# Patient Record
Sex: Female | Born: 1970
Health system: Southern US, Community
[De-identification: ages and names within clinical notes are randomized; demographics above are authoritative.]

## PROBLEM LIST (undated history)

## (undated) ENCOUNTER — Inpatient Hospital Stay (HOSPITAL_COMMUNITY): Payer: Self-pay

## (undated) DIAGNOSIS — G4730 Sleep apnea, unspecified: Secondary | ICD-10-CM

## (undated) DIAGNOSIS — K219 Gastro-esophageal reflux disease without esophagitis: Secondary | ICD-10-CM

## (undated) DIAGNOSIS — T7840XA Allergy, unspecified, initial encounter: Secondary | ICD-10-CM

## (undated) DIAGNOSIS — Z789 Other specified health status: Secondary | ICD-10-CM

## (undated) DIAGNOSIS — Z8632 Personal history of gestational diabetes: Secondary | ICD-10-CM

## (undated) DIAGNOSIS — Z862 Personal history of diseases of the blood and blood-forming organs and certain disorders involving the immune mechanism: Secondary | ICD-10-CM

## (undated) DIAGNOSIS — G4733 Obstructive sleep apnea (adult) (pediatric): Secondary | ICD-10-CM

## (undated) DIAGNOSIS — J302 Other seasonal allergic rhinitis: Secondary | ICD-10-CM

## (undated) DIAGNOSIS — Z9989 Dependence on other enabling machines and devices: Secondary | ICD-10-CM

## (undated) HISTORY — DX: Obstructive sleep apnea (adult) (pediatric): G47.33

## (undated) HISTORY — DX: Other seasonal allergic rhinitis: J30.2

## (undated) HISTORY — DX: Personal history of diseases of the blood and blood-forming organs and certain disorders involving the immune mechanism: Z86.2

## (undated) HISTORY — DX: Gastro-esophageal reflux disease without esophagitis: K21.9

## (undated) HISTORY — PX: WISDOM TOOTH EXTRACTION: SHX21

## (undated) HISTORY — PX: BREAST SURGERY: SHX581

## (undated) HISTORY — DX: Personal history of gestational diabetes: Z86.32

## (undated) HISTORY — DX: Sleep apnea, unspecified: G47.30

## (undated) HISTORY — DX: Allergy, unspecified, initial encounter: T78.40XA

## (undated) HISTORY — PX: BREAST REDUCTION SURGERY: SHX8

---
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
## 2006-06-14 ENCOUNTER — Emergency Department (HOSPITAL_COMMUNITY): Admission: EM | Admit: 2006-06-14 | Discharge: 2006-06-14 | Payer: Self-pay | Admitting: Emergency Medicine

## 2007-11-01 ENCOUNTER — Inpatient Hospital Stay (HOSPITAL_COMMUNITY): Admission: AD | Admit: 2007-11-01 | Discharge: 2007-11-05 | Payer: Self-pay | Admitting: *Deleted

## 2007-11-03 ENCOUNTER — Encounter (INDEPENDENT_AMBULATORY_CARE_PROVIDER_SITE_OTHER): Payer: Self-pay | Admitting: Obstetrics and Gynecology

## 2010-01-25 ENCOUNTER — Inpatient Hospital Stay (HOSPITAL_COMMUNITY): Admission: AD | Admit: 2010-01-25 | Discharge: 2010-01-25 | Payer: Self-pay | Admitting: Obstetrics and Gynecology

## 2010-04-23 ENCOUNTER — Inpatient Hospital Stay (HOSPITAL_COMMUNITY): Admission: AD | Admit: 2010-04-23 | Discharge: 2010-04-25 | Payer: Self-pay | Admitting: Obstetrics and Gynecology

## 2010-11-19 LAB — CBC
HCT: 45 % (ref 36.0–46.0)
MCHC: 34.7 g/dL (ref 30.0–36.0)
MCV: 98.7 fL (ref 78.0–100.0)
Platelets: 178 10*3/uL (ref 150–400)
Platelets: 221 10*3/uL (ref 150–400)
RDW: 14 % (ref 11.5–15.5)
RDW: 14 % (ref 11.5–15.5)
WBC: 11.8 10*3/uL — ABNORMAL HIGH (ref 4.0–10.5)
WBC: 12.5 10*3/uL — ABNORMAL HIGH (ref 4.0–10.5)

## 2010-11-19 LAB — RH IMMUNE GLOB WKUP(>/=20WKS)(NOT WOMEN'S HOSP)

## 2010-11-19 LAB — RPR: RPR Ser Ql: NONREACTIVE

## 2010-11-22 LAB — RH IMMUNE GLOBULIN WORKUP (NOT WOMEN'S HOSP)
ABO/RH(D): O NEG
Antibody Screen: NEGATIVE

## 2011-01-18 NOTE — Op Note (Signed)
Carol Parks, Carol Parks                 ACCOUNT NO.:  [id number]   MEDICAL RECORD NO.:  [id number]          PATIENT TYPE:  INP   LOCATION:  9164                          FACILITY:  [facility]   PHYSICIAN:  Lenoard Aden, M.D.DATE OF BIRTH:  [birth year]   DATE OF PROCEDURE:  DATE OF DISCHARGE:                               OPERATIVE REPORT   INDICATION FOR OPERATIVE DELIVERY:  Prolonged second stage maternal  exhaustion.   POSTOPERATIVE DIAGNOSIS:  Prolonged second stage maternal exhaustion.   PROCEDURE:  Outlet vacuum assisted vaginal delivery with Kiwi cup x1  pull, second degree midline laceration with bilateral sulcus tears with  repair.   SURGEON:  Lenoard Aden, M.D.   ANESTHESIA:  Epidural and local.   ESTIMATED BLOOD LOSS:  Six hundred mL.   COMPLICATIONS:  None.   DRAINS:  None.   COUNTS:  Correct.   CONDITION:  The patient to recovery in good condition.   BRIEF OPERATIVE NOTE:  After being apprised of the risks of vacuum  assistance including small incidence of cephalohematoma, scalp  laceration, intracranial hemorrhage, Kiwi cup was placed OA, appropriate  position, +3/+4 station for 1 pull over midline laceration as noted.  Full term living female, nuchal cord x1 reduced, Apgars 8 and 9.  Cord  blood collected.  Placenta was delivered manually intact, three vessel  cord noted.  Cervix inspected without lacerations.  Repair of bilateral  sulcus tears and midline laceration with 3-0 Vicryl and 4-0 Vicryl.  No  complications noted.  The patient tolerated the procedure well and went  to recovery in good condition.      Lenoard Aden, M.D.  Electronically Signed     RJT/MEDQ  D:  11/03/2007  T:  11/04/2007  Job:  [id number]

## 2011-01-21 NOTE — Discharge Summary (Signed)
Carol Parks, Carol Parks                 ACCOUNT NO.:  [id number]   MEDICAL RECORD NO.:  [id number]          PATIENT TYPE:  INP   LOCATION:  9374                          FACILITY:  [facility]   PHYSICIAN:  Lenoard Aden, M.D.DATE OF BIRTH:  04-23-1971   DATE OF ADMISSION:  11/01/2007  DATE OF DISCHARGE:  11/05/2007                               DISCHARGE SUMMARY   ADMISSION DIAGNOSIS:  Induction for mild preeclampsia.   DISCHARGE DIAGNOSIS:  Induction for mild preeclampsia.   HOSPITAL COURSE:  The patient underwent uncomplicated vacuum assistant  vaginal delivery with second degree laceration and repair on November 03, 2007.  Postoperative course was uncomplicated.  Blood pressure was  normalized at the 130s/70s.  On postpartum day #2, she continued to  improve.  Tolerating regular diet well.  Diuresed without difficulty.  She was discharged to home on November 05, 2007.  To follow up in the office  for 1-week blood pressure check.  Prenatal vitamins and iron, given.  Nonsteroidal inflammatory was recommended for pain relief.  Preeclamptic  precautions given.  Discharge teaching done.      Lenoard Aden, M.D.  Electronically Signed     RJT/MEDQ  D:  01/11/2008  T:  01/12/2008  Job:  [id number]

## 2011-05-27 LAB — COMPREHENSIVE METABOLIC PANEL
ALT: 13
AST: 20
Albumin: 2.3 — ABNORMAL LOW
Alkaline Phosphatase: 144 — ABNORMAL HIGH
BUN: 3 — ABNORMAL LOW
Calcium: 8.7
Chloride: 106
GFR calc Af Amer: >60
Glucose, Bld: 111 — ABNORMAL HIGH
Potassium: 3.5
Sodium: 134 — ABNORMAL LOW
Total Bilirubin: 0.8
Total Protein: 5.7 — ABNORMAL LOW

## 2011-05-27 LAB — CBC
HCT: 41.2
Hemoglobin: 14.7
MCHC: 34.7
RBC: 4.29
RBC: 4.33
RDW: 13.7
WBC: 16.5 — ABNORMAL HIGH

## 2011-05-27 LAB — SAMPLE TO BLOOD BANK

## 2011-05-27 LAB — DIC (DISSEMINATED INTRAVASCULAR COAGULATION)PANEL
Smear Review: NONE SEEN
aPTT: 28

## 2011-05-27 LAB — URIC ACID: Uric Acid, Serum: 3.9

## 2011-05-27 LAB — LACTATE DEHYDROGENASE: LDH: 151

## 2011-05-30 LAB — URIC ACID: Uric Acid, Serum: 3.9

## 2011-05-30 LAB — CBC
HCT: 31 — ABNORMAL LOW
HCT: 31.1 — ABNORMAL LOW
MCHC: 34.5
MCV: 96.1
Platelets: 196
Platelets: 216
RDW: 14
WBC: 18.8 — ABNORMAL HIGH

## 2011-05-30 LAB — RH IMMUNE GLOB WKUP(>/=20WKS)(NOT WOMEN'S HOSP): Fetal Screen: NEGATIVE

## 2011-05-30 LAB — COMPREHENSIVE METABOLIC PANEL
Albumin: 1.9 — ABNORMAL LOW
BUN: 4 — ABNORMAL LOW
Calcium: 6 — CL
Creatinine, Ser: 0.65
Total Protein: 4.9 — ABNORMAL LOW

## 2011-05-30 LAB — LACTATE DEHYDROGENASE: LDH: 235

## 2012-05-24 ENCOUNTER — Ambulatory Visit (INDEPENDENT_AMBULATORY_CARE_PROVIDER_SITE_OTHER): Payer: 59 | Admitting: Family Medicine

## 2012-05-24 VITALS — BP 138/90 | HR 84 | Temp 97.9°F | Resp 18 | Ht 64.0 in | Wt 246.8 lb

## 2012-05-24 DIAGNOSIS — Z30432 Encounter for removal of intrauterine contraceptive device: Secondary | ICD-10-CM

## 2012-05-24 DIAGNOSIS — M545 Low back pain, unspecified: Secondary | ICD-10-CM

## 2012-05-24 DIAGNOSIS — Z719 Counseling, unspecified: Secondary | ICD-10-CM

## 2012-05-24 DIAGNOSIS — Z0000 Encounter for general adult medical examination without abnormal findings: Secondary | ICD-10-CM

## 2012-05-24 LAB — POCT CBC
HCT, POC: 48.9 % — AB (ref 37.7–47.9)
MCH, POC: 31.5 pg — AB (ref 27–31.2)
MCV: 98.6 fL — AB (ref 80–97)
MID (cbc): 0.7 (ref 0–0.9)
POC LYMPH PERCENT: 23.9 %L (ref 10–50)
Platelet Count, POC: 354 10*3/uL (ref 142–424)
RDW, POC: 13.4 %
WBC: 10 10*3/uL (ref 4.6–10.2)

## 2012-05-24 NOTE — Patient Instructions (Signed)
Your should receive a call or letter about your lab results within the next week to 10 days.  Keeping You Healthy  Get These Tests 1. Blood Pressure- Have your blood pressure checked once a year by your health care provider.  Normal blood pressure is 120/80. 2. Weight- Have your body mass index (BMI) calculated to screen for obesity.  BMI is measure of body fat based on height and weight.  You can also calculate your own BMI at https://www.west-esparza.com/. 3. Cholesterol- Have your cholesterol checked every 5 years starting at age 1 then yearly starting at age 21. 4. Chlamydia, HIV, and other sexually transmitted diseases- Get screened every year until age 20, then within three months of each new sexual provider. 5. Pap Smear- Every 1-3 years; discuss with your health care provider. 6. Mammogram- Every year starting at age 42  Take these medicines  Calcium with Vitamin D-Your body needs 1200 mg of Calcium each day and [phone] IU of Vitamin D daily.  Your body can only absorb 500 mg of Calcium at a time so Calcium must be taken in 2 or 3 divided doses throughout the day.  Multivitamin with folic acid- Once daily if it is possible for you to become pregnant.  Get these Immunizations  Gardasil-Series of three doses; prevents HPV related illness such as genital warts and cervical cancer.  Menactra-Single dose; prevents meningitis.  Tetanus shot- Every 10 years.  Flu shot-Every year.  Take these steps 1. Do not smoke-Your healthcare provider can help you quit.  For tips on how to quit go to www.smokefree.gov or call 1-800 QUITNOW. 2. Be physically active- Exercise 5 days a week for at least 30 minutes.  If you are not already physically active, start slow and gradually work up to 30 minutes of moderate physical activity.  Examples of moderate activity include walking briskly, dancing, swimming, bicycling, etc. 3. Breast Cancer- A self breast exam every month is important for early detection  of breast cancer.  For more information and instruction on self breast exams, ask your healthcare provider or [email or web address]. 4. Eat a healthy diet- Eat a variety of healthy foods such as fruits, vegetables, whole grains, low fat milk, low fat cheeses, yogurt, lean meats, poultry and fish, beans, nuts, tofu, etc.  For more information go to www. Thenutritionsource.org 5. Drink alcohol in moderation- Limit alcohol intake to one drink or less per day. Never drink and drive. 6. Depression- Your emotional health is as important as your physical health.  If you're feeling down or losing interest in things you normally enjoy please talk to your healthcare provider about being screened for depression. 7. Dental visit- Brush and floss your teeth twice daily; visit your dentist twice a year. 8. Eye doctor- Get an eye exam at least every 2 years. 9. Helmet use- Always wear a helmet when riding a bicycle, motorcycle, rollerblading or skateboarding. 10. Safe sex- If you may be exposed to sexually transmitted infections, use a condom. 11. Seat belts- Seat belts can save your live; always wear one. 12. Smoke/Carbon Monoxide detectors- These detectors need to be installed on the appropriate level of your home. Replace batteries at least once a year. 13. Skin cancer- When out in the sun please cover up and use sunscreen 15 SPF or higher. 14. Violence- If anyone is threatening or hurting you, please tell your healthcare provider.

## 2012-05-24 NOTE — Progress Notes (Signed)
Subjective:    Patient ID: Carol Parks, female    DOB: Jul 06, 1971, 41 y.o.   MRN: [id number]  HPI Carol Parks is a 41 y.o. female Here for CPE/annual exam. 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  No known med problems.   Also would like removal of IUD.  Hx of Mirena - IUD removed here in 04/19/09. G2, P2002 - 4 and 39 yo girls. Current IUD in place since 2011 - Mirena.  Planning on trying to become pregnant.  Has not started PNV yet. OBGYN [facility].    Hx of low back pain - intermittent - low back only with pressure. NKI. Has massage, trying chiropracter as well.   Nki.  Sits for 12 hours. No bowel or bladder incontinence, no saddle anesthesia, no lower extremity weakness.  Exercising 2-4 times per week.    Gardasil in 2008.   Lunch 4 1/2 hours ago.  Review of Systems 13 point ros on PHS reviewed - no     Objective:   Physical Exam  Constitutional: She is oriented to person, place, and time. She appears well-developed and well-nourished.  HENT:  Head: Normocephalic and atraumatic.  Right Ear: External ear normal.  Left Ear: External ear normal.  Eyes: Conjunctivae normal and EOM are normal. Pupils are equal, round, and reactive to light.  Neck: Neck supple.  Cardiovascular: Normal rate, regular rhythm, normal heart sounds and intact distal pulses.   Pulmonary/Chest: Effort normal.  Abdominal: Soft. Bowel sounds are normal.  Genitourinary: Vagina normal and uterus normal. Pelvic exam was performed with patient supine. Cervix exhibits no motion tenderness. Right adnexum displays no tenderness. Left adnexum displays no tenderness.       See procedure below.   Musculoskeletal:       Lumbar back: She exhibits normal range of motion, no tenderness and no bony tenderness.  Neurological: She is alert and oriented to person, place, and time.  Skin: Skin is warm and dry.  Psychiatric: She has a normal mood and affect. Her behavior is normal.    VCO for IUD removal -  patient in lithotomy position, identified strings with speculum, small amt blood in vault with mucus.  Strings grasped with ring forceps and iud removed without difficulty. No complications.      Assessment & Plan:  Carol Parks is a 41 y.o. female 1. Physical exam, annual  POCT CBC, Comprehensive metabolic panel, Lipid panel, TSH  2. Low back pain    3. Counseling NOS    4. Encounter for IUD removal      Annual exam/CPE - check lipids, CMP, TSh, CBC today. Not truly fasting - so if lipids elevated - recheck fasting for 8 hours.  Low back pain -  likely mechanical/deconditioning, discussed HEP, back care manual.  Wt loss discussed and will check TSH. rtc precautions.    Counseling NOS - plans on trying to become pregnant. IUD removed as above without difficulty. Will start PNV QD - paper rx given pnv of choice qd.  Patient Instructions  Your should receive a call or letter about your lab results within the next week to 10 days.  Keeping You Healthy  Get These Tests 1. Blood Pressure- Have your blood pressure checked once a year by your health care provider.  Normal blood pressure is 120/80. 2. Weight- Have your body mass index (BMI) calculated to screen for obesity.  BMI is measure of body fat based on height and weight.  You can also calculate your  own BMI at https://www.west-esparza.com/. 3. Cholesterol- Have your cholesterol checked every 5 years starting at age 57 then yearly starting at age 65. 4. Chlamydia, HIV, and other sexually transmitted diseases- Get screened every year until age 52, then within three months of each new sexual provider. 5. Pap Smear- Every 1-3 years; discuss with your health care provider. 6. Mammogram- Every year starting at age 77  Take these medicines  Calcium with Vitamin D-Your body needs 1200 mg of Calcium each day and [phone] IU of Vitamin D daily.  Your body can only absorb 500 mg of Calcium at a time so Calcium must be taken in 2 or 3 divided doses  throughout the day.  Multivitamin with folic acid- Once daily if it is possible for you to become pregnant.  Get these Immunizations  Gardasil-Series of three doses; prevents HPV related illness such as genital warts and cervical cancer.  Menactra-Single dose; prevents meningitis.  Tetanus shot- Every 10 years.  Flu shot-Every year.  Take these steps 1. Do not smoke-Your healthcare provider can help you quit.  For tips on how to quit go to www.smokefree.gov or call 1-800 QUITNOW. 2. Be physically active- Exercise 5 days a week for at least 30 minutes.  If you are not already physically active, start slow and gradually work up to 30 minutes of moderate physical activity.  Examples of moderate activity include walking briskly, dancing, swimming, bicycling, etc. 3. Breast Cancer- A self breast exam every month is important for early detection of breast cancer.  For more information and instruction on self breast exams, ask your healthcare provider or [place]. 4. Eat a healthy diet- Eat a variety of healthy foods such as fruits, vegetables, whole grains, low fat milk, low fat cheeses, yogurt, lean meats, poultry and fish, beans, nuts, tofu, etc.  For more information go to www. Thenutritionsource.org 5. Drink alcohol in moderation- Limit alcohol intake to one drink or less per day. Never drink and drive. 6. Depression- Your emotional health is as important as your physical health.  If you're feeling down or losing interest in things you normally enjoy please talk to your healthcare provider about being screened for depression. 7. Dental visit- Brush and floss your teeth twice daily; visit your dentist twice a year. 8. Eye doctor- Get an eye exam at least every 2 years. 9. Helmet use- Always wear a helmet when riding a bicycle, motorcycle, rollerblading or skateboarding. 10. Safe sex- If you may be exposed to sexually transmitted infections, use a  condom. 11. Seat belts- Seat belts can save your live; always wear one. 12. Smoke/Carbon Monoxide detectors- These detectors need to be installed on the appropriate level of your home. Replace batteries at least once a year. 13. Skin cancer- When out in the sun please cover up and use sunscreen 15 SPF or higher. 14. Violence- If anyone is threatening or hurting you, please tell your healthcare provider.

## 2012-05-25 LAB — COMPREHENSIVE METABOLIC PANEL
AST: 15 U/L (ref 0–37)
Alkaline Phosphatase: 73 U/L (ref 39–117)
BUN: 14 mg/dL (ref 6–23)
Calcium: 8.9 mg/dL (ref 8.4–10.5)
Chloride: 104 mEq/L (ref 96–112)
Creat: 0.7 mg/dL (ref 0.50–1.10)
Glucose, Bld: 81 mg/dL (ref 70–99)

## 2012-05-25 LAB — LIPID PANEL
HDL: 48 mg/dL (ref >39)
Total CHOL/HDL Ratio: 4.5 Ratio
Triglycerides: 115 mg/dL (ref <150)

## 2012-05-25 LAB — TSH: TSH: 1.317 u[IU]/mL (ref 0.350–4.500)

## 2012-09-05 NOTE — L&D Delivery Note (Signed)
Delivery Note At 7:45 PM a viable female, "Carol Parks", was delivered via Vaginal, Spontaneous Delivery (Presentation: Left Occiput Anterior).  APGAR: 8, 9; weight .   Placenta status: Intact, Spontaneous.  Cord: 3 vessels with the following complications: None Cord pH: NA  Anesthesia: Local Epidural  Episiotomy: None Lacerations: 1st degree;Perineal Suture Repair: 3.0 monocry Est. Blood Loss (mL): 200  Mom to postpartum.  Baby to Couplet care / Skin to Skin. Placenta to path due to SGA vs IUGR. Family plans outpatient circumcision. Patient for PP BTL--consent on chart. Will maintain IV and epidural cath--plan procedure tomorrow. NPO after MN. FBS in am.  Nigel Bridgeman 09/04/2013, 8:44 PM

## 2012-10-10 ENCOUNTER — Ambulatory Visit (INDEPENDENT_AMBULATORY_CARE_PROVIDER_SITE_OTHER): Payer: 59 | Admitting: Physician Assistant

## 2012-10-10 VITALS — BP 131/92 | HR 99 | Temp 98.4°F | Resp 16 | Ht 64.0 in | Wt 243.6 lb

## 2012-10-10 DIAGNOSIS — J40 Bronchitis, not specified as acute or chronic: Secondary | ICD-10-CM

## 2012-10-10 DIAGNOSIS — J3489 Other specified disorders of nose and nasal sinuses: Secondary | ICD-10-CM

## 2012-10-10 DIAGNOSIS — R0981 Nasal congestion: Secondary | ICD-10-CM

## 2012-10-10 DIAGNOSIS — R059 Cough, unspecified: Secondary | ICD-10-CM

## 2012-10-10 DIAGNOSIS — R05 Cough: Secondary | ICD-10-CM

## 2012-10-10 MED ORDER — AZITHROMYCIN 250 MG PO TABS: ORAL_TABLET | ORAL | Status: DC

## 2012-10-10 NOTE — Progress Notes (Signed)
  Subjective:    Patient ID: Carol Parks, female    DOB: Feb 16, 1971, 42 y.o.   MRN: [id number]  HPI 42 year old female presents with 1 week history of cough.  States both of her daughters (102 and 37 years old) are currently being treated with antibiotics for similar symptoms. She has had a dry, persistent cough for the past 7 days.  Also complains of URI symptoms of nasal congestion, rhinorrhea, and some sinus pressure. Denies sinus pain, fever, chills, otalgia, sore throat, headache, neck pain, nausea, vomiting, or abdominal pain. She is otherwise doing well with no other complaints today.  She works night's as a Industrial/product designer.      Review of Systems  Constitutional: Negative for fever and chills.  HENT: Positive for congestion, rhinorrhea and postnasal drip. Negative for ear pain, sore throat, neck pain, neck stiffness and ear discharge.   Respiratory: Positive for cough. Negative for chest tightness and wheezing.   Gastrointestinal: Negative for nausea, vomiting and abdominal pain.  Neurological: Negative for dizziness, light-headedness and headaches.  All other systems reviewed and are negative.       Objective:   Physical Exam  Constitutional: She appears well-developed and well-nourished.  HENT:  Head: Normocephalic and atraumatic.  Right Ear: Hearing, tympanic membrane, external ear and ear canal normal.  Left Ear: Hearing, tympanic membrane, external ear and ear canal normal.  Mouth/Throat: Uvula is midline, oropharynx is clear and moist and mucous membranes are normal. No oropharyngeal exudate.  Eyes: Conjunctivae normal are normal.  Neck: Normal range of motion. Neck supple.  Cardiovascular: Normal rate, regular rhythm and normal heart sounds.   Pulmonary/Chest: Effort normal and breath sounds normal.  Lymphadenopathy:    She has no cervical adenopathy.  Neurological: She is alert.  Psychiatric: She has a normal mood and affect. Her behavior is normal. Judgment and thought  content normal.          Assessment & Plan:   1. Bronchitis  azithromycin (ZITHROMAX) 250 MG tablet  2. Cough    3. Nasal congestion     Zpack as directed Patient declined NS - she already has several at home she will try Also declined cough syrup - will call if she needs one.  Follow up if symptoms worsen or fail to improve.

## 2012-10-20 ENCOUNTER — Other Ambulatory Visit: Payer: Self-pay

## 2013-01-08 ENCOUNTER — Encounter: Payer: Self-pay | Admitting: Obstetrics and Gynecology

## 2013-01-08 LAB — OB RESULTS CONSOLE ANTIBODY SCREEN: Antibody Screen: NEGATIVE

## 2013-01-08 LAB — OB RESULTS CONSOLE HEPATITIS B SURFACE ANTIGEN: Hepatitis B Surface Ag: NEGATIVE

## 2013-01-08 LAB — OB RESULTS CONSOLE HIV ANTIBODY (ROUTINE TESTING): HIV: NONREACTIVE

## 2013-01-08 LAB — OB RESULTS CONSOLE ABO/RH: RH Type: POSITIVE

## 2013-01-08 LAB — OB RESULTS CONSOLE RPR: RPR: NONREACTIVE

## 2013-01-08 LAB — OB RESULTS CONSOLE GC/CHLAMYDIA: Chlamydia: NEGATIVE

## 2013-06-19 ENCOUNTER — Encounter: Payer: 59 | Attending: Obstetrics and Gynecology

## 2013-06-19 VITALS — Ht 63.5 in | Wt 250.8 lb

## 2013-06-19 DIAGNOSIS — O99810 Abnormal glucose complicating pregnancy: Secondary | ICD-10-CM

## 2013-06-19 DIAGNOSIS — Z713 Dietary counseling and surveillance: Secondary | ICD-10-CM | POA: Diagnosis not present

## 2013-06-20 NOTE — Progress Notes (Signed)
  Patient was seen on 06/19/13 for Gestational Diabetes self-management class at the [facility]. The following learning objectives were met by the patient during this course:   States the definition of Gestational Diabetes  States why dietary management is important in controlling blood glucose  Describes the effects each nutrient has on blood glucose levels  Demonstrates ability to create a balanced meal plan  Demonstrates carbohydrate counting   States when to check blood glucose levels  Demonstrates proper blood glucose monitoring techniques  States the effect of stress and exercise on blood glucose levels  States the importance of limiting caffeine and abstaining from alcohol and smoking  Blood glucose monitor given: Accu Chek Nano BG Monitoring Kit Lot # [id number] Exp: 08/04/14  Patient instructed to monitor glucose levels: FBS: 60 - <90 1 hour: <140 2 hour: <120  *Patient received handouts:  Nutrition Diabetes and Pregnancy  Carbohydrate Counting List  Patient will be seen for follow-up as needed.

## 2013-07-11 ENCOUNTER — Other Ambulatory Visit: Payer: Self-pay

## 2013-08-13 ENCOUNTER — Encounter (HOSPITAL_COMMUNITY): Payer: Self-pay | Admitting: *Deleted

## 2013-08-13 ENCOUNTER — Inpatient Hospital Stay (HOSPITAL_COMMUNITY)
Admission: AD | Admit: 2013-08-13 | Discharge: 2013-08-13 | Disposition: A | Discharge status: Home / Self Care | Payer: 59 | Source: Ambulatory Visit | Attending: Obstetrics and Gynecology | Admitting: Obstetrics and Gynecology

## 2013-08-13 DIAGNOSIS — O479 False labor, unspecified: Secondary | ICD-10-CM | POA: Diagnosis present

## 2013-08-13 HISTORY — DX: Other specified health status: Z78.9

## 2013-08-13 NOTE — MAU Note (Signed)
Pt presents with complaint of contractions. Denies bleeding or ROM 

## 2013-08-13 NOTE — Discharge Instructions (Signed)
Br

## 2013-08-13 NOTE — MAU Note (Signed)
Started having consistent uc's 1700.l Denies LOF, discharge, or bleeding.

## 2013-09-03 ENCOUNTER — Inpatient Hospital Stay (HOSPITAL_COMMUNITY)
Admission: AD | Admit: 2013-09-03 | Discharge: 2013-09-06 | DRG: 767 | Disposition: A | Discharge status: Home / Self Care | Payer: 59 | Source: Intra-hospital | Attending: Obstetrics and Gynecology | Admitting: Obstetrics and Gynecology

## 2013-09-03 ENCOUNTER — Encounter (HOSPITAL_COMMUNITY): Payer: Self-pay | Admitting: *Deleted

## 2013-09-03 DIAGNOSIS — O360990 Maternal care for other rhesus isoimmunization, unspecified trimester, not applicable or unspecified: Secondary | ICD-10-CM | POA: Diagnosis present

## 2013-09-03 DIAGNOSIS — Z91040 Latex allergy status: Secondary | ICD-10-CM | POA: Diagnosis present

## 2013-09-03 DIAGNOSIS — Z302 Encounter for sterilization: Secondary | ICD-10-CM | POA: Diagnosis not present

## 2013-09-03 DIAGNOSIS — O09299 Supervision of pregnancy with other poor reproductive or obstetric history, unspecified trimester: Secondary | ICD-10-CM

## 2013-09-03 DIAGNOSIS — O09529 Supervision of elderly multigravida, unspecified trimester: Secondary | ICD-10-CM

## 2013-09-03 DIAGNOSIS — Z6791 Unspecified blood type, Rh negative: Secondary | ICD-10-CM | POA: Diagnosis present

## 2013-09-03 DIAGNOSIS — O365990 Maternal care for other known or suspected poor fetal growth, unspecified trimester, not applicable or unspecified: Principal | ICD-10-CM | POA: Diagnosis present

## 2013-09-03 DIAGNOSIS — O24419 Gestational diabetes mellitus in pregnancy, unspecified control: Secondary | ICD-10-CM | POA: Diagnosis present

## 2013-09-03 DIAGNOSIS — O480 Post-term pregnancy: Secondary | ICD-10-CM | POA: Diagnosis present

## 2013-09-03 DIAGNOSIS — Z9889 Other specified postprocedural states: Secondary | ICD-10-CM

## 2013-09-03 DIAGNOSIS — O99814 Abnormal glucose complicating childbirth: Secondary | ICD-10-CM | POA: Diagnosis present

## 2013-09-03 MED ORDER — OXYTOCIN 40 UNITS IN LACTATED RINGERS INFUSION - SIMPLE MED
62.5000 mL/h | INTRAVENOUS | Status: DC
  Administered 2013-09-04: 500 mL/h via INTRAVENOUS

## 2013-09-03 MED ORDER — LACTATED RINGERS IV SOLN
500.0000 mL | INTRAVENOUS | Status: DC | PRN
  Administered 2013-09-04 (×2): 500 mL via INTRAVENOUS

## 2013-09-03 MED ORDER — ZOLPIDEM TARTRATE 5 MG PO TABS: 5.0000 mg | ORAL_TABLET | Freq: Every evening | ORAL | Status: DC | PRN

## 2013-09-03 MED ORDER — IBUPROFEN 600 MG PO TABS
600.0000 mg | ORAL_TABLET | Freq: Four times a day (QID) | ORAL | Status: DC | PRN
  Administered 2013-09-04: 600 mg via ORAL
  Filled 2013-09-03: qty 1

## 2013-09-03 MED ORDER — OXYTOCIN BOLUS FROM INFUSION: 500.0000 mL | INTRAVENOUS | Status: DC

## 2013-09-03 MED ORDER — ONDANSETRON HCL 4 MG/2ML IJ SOLN: 4.0000 mg | Freq: Four times a day (QID) | INTRAMUSCULAR | Status: DC | PRN

## 2013-09-03 MED ORDER — CITRIC ACID-SODIUM CITRATE 334-500 MG/5ML PO SOLN: 30.0000 mL | ORAL | Status: DC | PRN

## 2013-09-03 MED ORDER — ACETAMINOPHEN 325 MG PO TABS: 650.0000 mg | ORAL_TABLET | ORAL | Status: DC | PRN

## 2013-09-03 MED ORDER — LIDOCAINE HCL (PF) 1 % IJ SOLN
30.0000 mL | INTRAMUSCULAR | Status: DC | PRN
  Administered 2013-09-04: 30 mL via SUBCUTANEOUS
  Filled 2013-09-03 (×2): qty 30

## 2013-09-03 MED ORDER — TERBUTALINE SULFATE 1 MG/ML IJ SOLN: 0.2500 mg | Freq: Once | INTRAMUSCULAR | Status: AC | PRN

## 2013-09-03 MED ORDER — OXYCODONE-ACETAMINOPHEN 5-325 MG PO TABS: 1.0000 | ORAL_TABLET | ORAL | Status: DC | PRN

## 2013-09-03 MED ORDER — FLEET ENEMA 7-19 GM/118ML RE ENEM: 1.0000 | ENEMA | RECTAL | Status: DC | PRN

## 2013-09-03 MED ORDER — LACTATED RINGERS IV SOLN
INTRAVENOUS | Status: DC
  Administered 2013-09-04 (×3): via INTRAVENOUS

## 2013-09-03 MED ORDER — OXYTOCIN 40 UNITS IN LACTATED RINGERS INFUSION - SIMPLE MED
1.0000 m[IU]/min | INTRAVENOUS | Status: DC
  Administered 2013-09-04: 1 m[IU]/min via INTRAVENOUS
  Filled 2013-09-03: qty 1000

## 2013-09-03 MED ORDER — MISOPROSTOL 25 MCG QUARTER TABLET
25.0000 ug | ORAL_TABLET | ORAL | Status: DC | PRN
  Administered 2013-09-03: 25 ug via VAGINAL
  Filled 2013-09-03: qty 0.25
  Filled 2013-09-03: qty 1

## 2013-09-03 NOTE — H&P (Signed)
Carol Parks is a 42 y.o. female, G3P2002 at 60 5/7 weeks, presenting for induction for SGA vs IUGR on [place] today, gestational diabetes (diet controlled), and postdates.  Cervix was 1 cm in the office. CBGs have been WNL.  Patient desires BTL--consent signed 06/12/13, copy in EPIC.  History of present pregnancy: Patient entered care at 10 weeks.   EDC of 08/29/13 was established by LMP and 10 week [place].   Anatomy scan:  19 weeks, with normal findings and an posterior placenta.   Additional [place] evaluations:   38 weeks--EFW 33.4%ile, AFI 15.43, BPP 8/8 40 5/7 weeks--EFW 3052, AFI 12.02, vtx, BPP 8/8 Significant prenatal events:  Had normal early 3 hour GTT, but abnormal one in f/u at 28 weeks.  Received Rhogam at 30 weeks.  Received TDaP and flu vaccine on 11/6.  Had increased itching at 18 weeks, with normal bile acids.  CBGs have been WNL. Last evaluation:  09/03/13, with cervix 1 cm, BPP 8/8, but SGA vs IUGR on [place] (no weight percentile calculated).  Patient Active Problem List   Diagnosis Date Noted  . SGA (small for gestational age) 09/03/2013  . Hx of preeclampsia, prior pregnancy, currently pregnant 09/03/2013  . Gestational diabetes--diet controlled. 09/03/2013  . Rh negative state in antepartum period 09/03/2013  . Latex allergy 09/03/2013  . Elderly multigravida with antepartum condition or complication 09/03/2013  . Hx of reduction mammoplasty 09/03/2013     History OB History   Grav Para Term Preterm Abortions TAB SAB Ect Mult Living   3 2 2  0 0 0 0 0 0 2    2009--[facility] assisted delivery, 39 weeks, induction for pre-eclampsia, 6+3, female, epidural 2011--[facility], 41 weeks, 7+6, female, epidural  Past Medical History  Diagnosis Date  . Allergy   . Medical history non-contributory    Past Surgical History  Procedure Laterality Date  . Breast surgery    . Breast reduction surgery Bilateral    Family History: family history includes Aneurysm in her father; Asthma in her daughter;  Cancer in her mother; Heart attack in her father, maternal grandmother, paternal grandfather, and paternal grandmother; Hypertension in her mother.  Social History:  reports that she has never smoked. She does not have any smokeless tobacco history on file. She reports that she does not drink alcohol or use illicit drugs.  Husband, Jacqulyn Barresi, is involved and supportive.  Patient is currently employed.   Prenatal Transfer Tool  Maternal Diabetes: Yes:  Diabetes Type:  Diet controlled--stable CBGs Genetic Screening: Declined Maternal Ultrasounds/Referrals: Abnormal:  Findings:   IUGR vs SGA today on US Fetal Ultrasounds or other Referrals:  None Maternal Substance Abuse:  No Significant Maternal Medications:  None Significant Maternal Lab Results:  Lab values include: Group B Strep negative, Rh negative Other Comments:  None  ROS:  Occasional contractions, +FM  Dilation: 1 Effacement (%): 50 Station: -2 Exam by:: L.mears,RN Blood pressure 118/66, pulse 92, resp. rate 18, height 5\' 4"  (1.626 m), weight 247 lb (112.038 kg), last menstrual period 09/26/2012.  Exam Physical Exam  Chest clear Heart RRR without murmur Abd gravid, NT Pelvic--as above Ext WNL  FHR Category 1 UCs occasional  Prenatal labs: ABO, Rh: O/Positive/-- (05/06 0000) Antibody: Negative (05/06 0000) Rubella: Immune (05/06 0000) RPR: Nonreactive (05/06 0000)  HBsAg: Negative (05/06 0000)  HIV: Non-reactive (05/06 0000)  GBS: Negative (11/28 0000)  Elevated early 1 hour GTT, normal 3 hour--then abnormal 3 hour GTT at 28 weeks. Negative GC, chlamydia at NOB  Received Rhophylac at 28 weeks. Normal bile acids at 18 weeks. Declined genetic testing.  Assessment/Plan: IUP at 40 5/7 weeks Postdates SGA vs IUGR  GBS negative Gestational diabetes, diet controlled Desires PP BTL--consent on chart, signed 06/12/13  Plan: Admit to Birthing Suite per consult with Dr. Charlotta Newton Routine [facility] orders Plan cytotech 25  mcg q 4 hours for cervical ripening, then Pitocin as needed in am. Patient plans epidural CBG on admission, then q 4 hours during labor. R&B of BTL reviewed during office visit with VL, including failure of method, bleeding, infection, and damage to other organs.   Nigel Bridgeman 09/03/2013, 11:28 PM

## 2013-09-04 ENCOUNTER — Encounter (HOSPITAL_COMMUNITY): Payer: 59 | Admitting: Anesthesiology

## 2013-09-04 ENCOUNTER — Inpatient Hospital Stay (HOSPITAL_COMMUNITY): Payer: 59 | Admitting: Anesthesiology

## 2013-09-04 ENCOUNTER — Encounter (HOSPITAL_COMMUNITY): Payer: Self-pay

## 2013-09-04 DIAGNOSIS — O365990 Maternal care for other known or suspected poor fetal growth, unspecified trimester, not applicable or unspecified: Secondary | ICD-10-CM | POA: Diagnosis not present

## 2013-09-04 LAB — GLUCOSE, CAPILLARY
Glucose-Capillary: 79 mg/dL (ref 70–99)
Glucose-Capillary: 48 mg/dL — ABNORMAL LOW (ref 70–99)
Glucose-Capillary: 74 mg/dL (ref 70–99)

## 2013-09-04 LAB — CBC
HCT: 40.3 % (ref 36.0–46.0)
Hemoglobin: 14 g/dL (ref 12.0–15.0)
MCH: 31.9 pg (ref 26.0–34.0)
Platelets: 224 10*3/uL (ref 150–400)
RBC: 4.39 MIL/uL (ref 3.87–5.11)
RDW: 14.1 % (ref 11.5–15.5)
WBC: 11.7 10*3/uL — ABNORMAL HIGH (ref 4.0–10.5)

## 2013-09-04 LAB — TYPE AND SCREEN: Antibody Screen: NEGATIVE

## 2013-09-04 LAB — RPR: RPR Ser Ql: NONREACTIVE

## 2013-09-04 MED ORDER — ONDANSETRON HCL 4 MG PO TABS: 4.0000 mg | ORAL_TABLET | ORAL | Status: DC | PRN

## 2013-09-04 MED ORDER — EPHEDRINE 5 MG/ML INJ
10.0000 mg | INTRAVENOUS | Status: DC | PRN
  Filled 2013-09-04: qty 2
  Filled 2013-09-04: qty 4

## 2013-09-04 MED ORDER — FENTANYL 2.5 MCG/ML BUPIVACAINE 1/10 % EPIDURAL INFUSION (WH - ANES)
14.0000 mL/h | INTRAMUSCULAR | Status: DC | PRN
  Administered 2013-09-04 (×2): 14 mL/h via EPIDURAL
  Filled 2013-09-04 (×2): qty 125

## 2013-09-04 MED ORDER — IBUPROFEN 600 MG PO TABS
600.0000 mg | ORAL_TABLET | Freq: Four times a day (QID) | ORAL | Status: DC
  Administered 2013-09-05 – 2013-09-06 (×5): 600 mg via ORAL
  Filled 2013-09-04 (×6): qty 1

## 2013-09-04 MED ORDER — LANOLIN HYDROUS EX OINT: TOPICAL_OINTMENT | CUTANEOUS | Status: DC | PRN

## 2013-09-04 MED ORDER — EPHEDRINE 5 MG/ML INJ
10.0000 mg | INTRAVENOUS | Status: DC | PRN
  Filled 2013-09-04: qty 2

## 2013-09-04 MED ORDER — SIMETHICONE 80 MG PO CHEW: 80.0000 mg | CHEWABLE_TABLET | ORAL | Status: DC | PRN

## 2013-09-04 MED ORDER — PHENYLEPHRINE 40 MCG/ML (10ML) SYRINGE FOR IV PUSH (FOR BLOOD PRESSURE SUPPORT)
80.0000 ug | PREFILLED_SYRINGE | INTRAVENOUS | Status: DC | PRN
  Filled 2013-09-04: qty 2

## 2013-09-04 MED ORDER — DIPHENHYDRAMINE HCL 25 MG PO CAPS: 25.0000 mg | ORAL_CAPSULE | Freq: Four times a day (QID) | ORAL | Status: DC | PRN

## 2013-09-04 MED ORDER — LIDOCAINE HCL (PF) 1 % IJ SOLN
INTRAMUSCULAR | Status: DC | PRN
  Administered 2013-09-04 (×4): 4 mL

## 2013-09-04 MED ORDER — OXYCODONE-ACETAMINOPHEN 5-325 MG PO TABS
1.0000 | ORAL_TABLET | ORAL | Status: DC | PRN
  Administered 2013-09-05 – 2013-09-06 (×2): 1 via ORAL
  Filled 2013-09-04 (×2): qty 1

## 2013-09-04 MED ORDER — LACTATED RINGERS IV SOLN
500.0000 mL | Freq: Once | INTRAVENOUS | Status: AC
  Administered 2013-09-04: 500 mL via INTRAVENOUS

## 2013-09-04 MED ORDER — WITCH HAZEL-GLYCERIN EX PADS: 1.0000 "application " | MEDICATED_PAD | CUTANEOUS | Status: DC | PRN

## 2013-09-04 MED ORDER — DIPHENHYDRAMINE HCL 50 MG/ML IJ SOLN: 12.5000 mg | INTRAMUSCULAR | Status: DC | PRN

## 2013-09-04 MED ORDER — METOCLOPRAMIDE HCL 10 MG PO TABS
10.0000 mg | ORAL_TABLET | Freq: Once | ORAL | Status: AC
  Administered 2013-09-05: 10 mg via ORAL
  Filled 2013-09-04: qty 1

## 2013-09-04 MED ORDER — DIBUCAINE 1 % RE OINT: 1.0000 "application " | TOPICAL_OINTMENT | RECTAL | Status: DC | PRN

## 2013-09-04 MED ORDER — SENNOSIDES-DOCUSATE SODIUM 8.6-50 MG PO TABS
2.0000 | ORAL_TABLET | ORAL | Status: DC
  Administered 2013-09-05 (×2): 2 via ORAL
  Filled 2013-09-04 (×2): qty 2

## 2013-09-04 MED ORDER — PHENYLEPHRINE 40 MCG/ML (10ML) SYRINGE FOR IV PUSH (FOR BLOOD PRESSURE SUPPORT)
80.0000 ug | PREFILLED_SYRINGE | INTRAVENOUS | Status: DC | PRN
  Filled 2013-09-04: qty 2
  Filled 2013-09-04: qty 10

## 2013-09-04 MED ORDER — LACTATED RINGERS IV SOLN: INTRAVENOUS | Status: DC

## 2013-09-04 MED ORDER — FAMOTIDINE 20 MG PO TABS
40.0000 mg | ORAL_TABLET | Freq: Once | ORAL | Status: AC
  Administered 2013-09-05: 40 mg via ORAL
  Filled 2013-09-04: qty 2

## 2013-09-04 MED ORDER — ONDANSETRON HCL 4 MG/2ML IJ SOLN: 4.0000 mg | INTRAMUSCULAR | Status: DC | PRN

## 2013-09-04 MED ORDER — OXYTOCIN 40 UNITS IN LACTATED RINGERS INFUSION - SIMPLE MED
1.0000 m[IU]/min | INTRAVENOUS | Status: DC
  Filled 2013-09-04: qty 1000

## 2013-09-04 MED ORDER — PRENATAL MULTIVITAMIN CH
1.0000 | ORAL_TABLET | Freq: Every day | ORAL | Status: DC
  Administered 2013-09-05 – 2013-09-06 (×2): 1 via ORAL
  Filled 2013-09-04 (×2): qty 1

## 2013-09-04 MED ORDER — BENZOCAINE-MENTHOL 20-0.5 % EX AERO: 1.0000 "application " | INHALATION_SPRAY | CUTANEOUS | Status: DC | PRN

## 2013-09-04 MED ORDER — ZOLPIDEM TARTRATE 5 MG PO TABS: 5.0000 mg | ORAL_TABLET | Freq: Every evening | ORAL | Status: DC | PRN

## 2013-09-04 MED ORDER — TETANUS-DIPHTH-ACELL PERTUSSIS 5-2.5-18.5 LF-MCG/0.5 IM SUSP: 0.5000 mL | Freq: Once | INTRAMUSCULAR | Status: DC

## 2013-09-04 MED ORDER — LACTATED RINGERS IV SOLN
INTRAVENOUS | Status: DC
  Administered 2013-09-04: 16:00:00 via INTRAUTERINE

## 2013-09-04 NOTE — Progress Notes (Signed)
Pitocin off for the last 2 hours was just restarted at 2 mU/min  Subjective:  Patient uncomfortable in the last 30 minutes and IUPC fell out MVU down to 50     Objective: BP 125/76  Pulse 64  Temp(Src) 98.4 F (36.9 C) (Oral)  Resp 20  Ht 5\' 4"  (1.626 m)  Wt 247 lb (112.038 kg)  BMI 42.38 kg/m2  SpO2 99%  LMP 09/26/2012      FHT: excellent variability with early decelerations SVE:   Dilation: Lip/rim Effacement (%): 90 Station: 0 Exam by:: Dr. Estanislado Pandy   Assessment / Plan: Induction of labor progressing well on pitocin Fetal Wellbeing: reassuring Anticipated MOD:  NSVD  Carol Parks A 09/04/2013, 7:09 PM

## 2013-09-04 NOTE — Progress Notes (Signed)
  Subjective: Resting, but hasn't slept.  Hips hurt when she is on right side.  Aware of contractions, but not uncomfortable.  Objective: BP 116/71  Pulse 84  Temp(Src) 98.4 F (36.9 C) (Oral)  Resp 18  Ht 5\' 4"  (1.626 m)  Wt 247 lb (112.038 kg)  BMI 42.38 kg/m2  SpO2 99%  LMP 09/26/2012   Serum glucose at 0110 = 80  FHT: Category 1--single mild variable around 5:15a, but reactive before and after.  Segments of negative spontaneous CST. UC:  Irregular, some segments of q 3 min. SVE:   2 cm, 60%, vtx, -2/-3, but definitely vtx to exam, slightly ballotable.  Assessment / Plan: Induction for postdates, gestational diabetes, SGA vs IUGR. GBS negative. Will start low-dose pitocin and closely observe FHR tolerance to labor. CBGs q 4 hours during labor. Epidural prn.  Nigel Bridgeman 09/04/2013, 6:15 AM

## 2013-09-04 NOTE — Progress Notes (Signed)
  Subjective: Resting in Semi-Fowler's position. Aware of contractions, but not uncomfortable.  Objective: BP 116/71  Pulse 84  Temp(Src) 98.4 F (36.9 C) (Oral)  Resp 18  Ht 5\' 4"  (1.626 m)  Wt 247 lb (112.038 kg)  BMI 42.38 kg/m2  SpO2 99%  LMP 09/26/2012      FHT: Category 1, with negative spontaneous CST at present. UC:   regular, every 2 minutes, mild. SVE:   Deferred  Assessment / Plan: Induction of labor--single dose Cytotech at 2209. Isolated decels, with overall reassuring FHR status Start low-dose pitocin at 7am.  Carol Parks 09/04/2013, 4:10 AM

## 2013-09-04 NOTE — Progress Notes (Addendum)
  Subjective: Comfortable.  Husband at bedside.    Objective: BP 126/67  Pulse 77  Temp(Src) 97.8 F (36.6 C) (Oral)  Resp 18  Ht 5\' 4"  (1.626 m)  Wt 247 lb (112.038 kg)  BMI 42.38 kg/m2  SpO2 99%  LMP 09/26/2012      FHT:  Category 2 segment, due to 2 moderate variables and one prolonged decel x 2 min.  Category 1 before and after events--position changed, O2 applied, and cervix checked.  No change from admission exam, with vtx ballotable. Fetus very active, with audible accels. UC:   Irregular, mild SVE:   Dilation: 1 Effacement (%): 50 Station: -2 Exam by:: Manfred Arch CNM Vtx ballotable. 1st cytotech placed at 2209--2nd dose deferred due to sporadic decels.   Assessment / Plan: Induction for postdates, SGA, gestational diabetes. Sporadic decels. Reviewed FHR with patient and husband, including possibility of fetal intolerance to advanced labor.  Discussed R&B of cesarean, including bleeding, infection, anesthesia risk, and damage to other organs.  Advised patient we would monitor FHR closely during labor. Will plan to initiate low-dose pitocin beginning at 7am. Patient and husband agreeable with plan.  Nigel Bridgeman 09/04/2013, 2:30am

## 2013-09-04 NOTE — Anesthesia Procedure Notes (Signed)
Epidural Patient location during procedure: OB Start time: 09/04/2013 12:34 PM  Staffing Performed by: anesthesiologist   Preanesthetic Checklist Completed: patient identified, site marked, surgical consent, pre-op evaluation, timeout performed, IV checked, risks and benefits discussed and monitors and equipment checked  Epidural Patient position: sitting Prep: site prepped and draped and DuraPrep Patient monitoring: continuous pulse ox and blood pressure Approach: midline Injection technique: LOR air  Needle:  Needle type: Tuohy  Needle gauge: 17 G Needle length: 9 cm and 9 Needle insertion depth: 6 cm Catheter type: closed end flexible Catheter size: 19 Gauge Catheter at skin depth: 11 cm Test dose: negative  Assessment Events: blood not aspirated, injection not painful, no injection resistance, negative IV test and no paresthesia  Additional Notes Discussed risk of headache, infection, bleeding, nerve injury and failed or incomplete block.  Patient voices understanding and wishes to proceed.  Epidural placed easily on first attempt.  No paresthesia.  Patient tolerated procedure well with no apparent complications. Jasmine December, MD Reason for block:procedure for pain

## 2013-09-04 NOTE — Anesthesia Preprocedure Evaluation (Addendum)
Anesthesia Evaluation  Patient identified by MRN, date of birth, ID band Patient awake    Reviewed: Allergy & Precautions, H&P , Patient's Chart, lab work & pertinent test results  Airway Mallampati: III TM Distance: >3 FB Neck ROM: full    Dental   Pulmonary  breath sounds clear to auscultation        Cardiovascular Rhythm:regular Rate:Normal     Neuro/Psych    GI/Hepatic   Endo/Other  diabetes (diet-controlled), GestationalMorbid obesity  Renal/GU      Musculoskeletal   Abdominal   Peds  Hematology   Anesthesia Other Findings   Reproductive/Obstetrics (+) Pregnancy                          Anesthesia Physical Anesthesia Plan  ASA: III  Anesthesia Plan: Epidural   Post-op Pain Management:    Induction:   Airway Management Planned:   Additional Equipment:   Intra-op Plan:   Post-operative Plan:   Informed Consent: I have reviewed the patients History and Physical, chart, labs and discussed the procedure including the risks, benefits and alternatives for the proposed anesthesia with the patient or authorized representative who has indicated his/her understanding and acceptance.     Plan Discussed with:   Anesthesia Plan Comments:         Anesthesia Quick Evaluation

## 2013-09-04 NOTE — Progress Notes (Signed)
Delivery of live viable female by Nigel Bridgeman, CNM at [phone].

## 2013-09-04 NOTE — Progress Notes (Signed)
  Subjective: Resting comfortably, but reports had 3-4 contractions in a row.  FHR had decel after 3rd contraction. Had received 1st dose Cytotech at 2209.  Objective: BP 126/67  Pulse 73  Temp(Src) 97.8 F (36.6 C) (Oral)  Resp 18  Ht 5\' 4"  (1.626 m)  Wt 247 lb (112.038 kg)  BMI 42.38 kg/m2  LMP 09/26/2012      FHT:  Category 1 prior to decel, then decel down to 60 x 6-7 min, with gradual return to baseline 140s with reactivity.  Associate with episode of tachysystole. UC:   Run of mild UCs, now resolved. SVE:   Dilation: 1 Effacement (%): 50 Station: -2 Exam by:: L.mears,RN when Cytotech placed at 2209.  Assessment / Plan: IUP at 40 6/7 weeks Induction for postdates, gestational diabetes, ? SGA vs IUGR. Will defer further doses of Cytotech--plan initiation of pitocin for low-dose ripening at 0215. Close observation of FHR status.  Carol Parks 09/04/2013, 1:19 AM

## 2013-09-04 NOTE — Progress Notes (Signed)
Called by nurse for variable decelerations and possible SROM  Subjective:  Comfortable with  Epidural Contractions every 2 minutes, lasting 60 seconds    Objective: BP 127/68  Pulse 67  Temp(Src) 97.8 F (36.6 C) (Oral)  Resp 16  Ht 5\' 4"  (1.626 m)  Wt 247 lb (112.038 kg)  BMI 42.38 kg/m2  SpO2 100%  LMP 09/26/2012    FHT:  FHR: 140 bpm, variability: moderate,  accelerations:  Abscent,  decelerations:  Present early decelerations perfectly mirrorring contractions down to 90 bpm lasting < 60 seconds MVU: 190 after d/c Pitocin and inserting IUPC and placing scalp lead SVE:   6 / 90 / -1  BBOW: AROM with moderate meconium  Assessment / Plan: Induction of labor progressing well on pitocin Fetal Wellbeing: Category 2 with good variability. Starting amnioinfusion. All findings reviewed with patient and husband who voice understanding Anticipated MOD:  NSVD  Carol Parks A 09/04/2013, 4:33 PM

## 2013-09-04 NOTE — Progress Notes (Signed)
Madysin Crisp is a 42 y.o. G3P2002 at 31w6dadmitted for SGA or arrest of growth  Subjective:  Comfortable with  Epidural Contractions every 2-3 minutes, lasting 50-60 seconds, intensity 5/10 before epidural on pitocin 8 mU/min    Objective: BP 97/56  Pulse 66  Temp(Src) 98.2 F (36.8 C) (Oral)  Resp 18  Ht 5\' 4"  (1.626 m)  Wt 247 lb (112.038 kg)  BMI 42.38 kg/m2  SpO2 100%  LMP 09/26/2012      FHT:  Reactive and reassuring category 1 SVE:   Dilation: 3.5 Effacement (%): 80 Station: -2;-3 Exam by:: Dr. Estanislado Pandy  Labs: Lab Results  Component Value Date   WBC 11.7* 09/04/2013   HGB 14.0 09/04/2013   HCT 40.3 09/04/2013   MCV 91.8 09/04/2013   PLT 224 09/04/2013    Assessment / Plan: Induction of labor due to  progressing well on pitocin Fetal Wellbeing: reassuring Anticipated MOD:  NSVD Will plan AROM after fetal head descent  Knoah Nedeau A 09/04/2013, 1:41 PM

## 2013-09-05 ENCOUNTER — Encounter (HOSPITAL_COMMUNITY)
Admission: AD | Disposition: A | Discharge status: Home / Self Care | Payer: Self-pay | Attending: Obstetrics and Gynecology

## 2013-09-05 ENCOUNTER — Encounter (HOSPITAL_COMMUNITY): Payer: 59 | Admitting: Anesthesiology

## 2013-09-05 ENCOUNTER — Inpatient Hospital Stay (HOSPITAL_COMMUNITY): Payer: 59 | Admitting: Anesthesiology

## 2013-09-05 DIAGNOSIS — O365990 Maternal care for other known or suspected poor fetal growth, unspecified trimester, not applicable or unspecified: Secondary | ICD-10-CM | POA: Diagnosis not present

## 2013-09-05 HISTORY — PX: TUBAL LIGATION: SHX77

## 2013-09-05 LAB — GLUCOSE, CAPILLARY
GLUCOSE-CAPILLARY: 91 mg/dL (ref 70–99)
Glucose-Capillary: 83 mg/dL (ref 70–99)

## 2013-09-05 LAB — CBC
HCT: 35.5 % — ABNORMAL LOW (ref 36.0–46.0)
Hemoglobin: 12.2 g/dL (ref 12.0–15.0)
MCH: 31.8 pg (ref 26.0–34.0)
MCHC: 34.4 g/dL (ref 30.0–36.0)
MCV: 92.4 fL (ref 78.0–100.0)
Platelets: 201 10*3/uL (ref 150–400)
RBC: 3.84 MIL/uL — ABNORMAL LOW (ref 3.87–5.11)
RDW: 14.1 % (ref 11.5–15.5)
WBC: 12.7 10*3/uL — ABNORMAL HIGH (ref 4.0–10.5)

## 2013-09-05 LAB — MRSA PCR SCREENING: MRSA BY PCR: NEGATIVE

## 2013-09-05 SURGERY — LIGATION, FALLOPIAN TUBE, POSTPARTUM
Anesthesia: Epidural | Site: Abdomen | Laterality: Bilateral

## 2013-09-05 MED ORDER — LIDOCAINE-EPINEPHRINE (PF) 2 %-1:200000 IJ SOLN
INTRAMUSCULAR | Status: AC
  Filled 2013-09-05: qty 20

## 2013-09-05 MED ORDER — FAMOTIDINE 20 MG PO TABS: 40.0000 mg | ORAL_TABLET | Freq: Once | ORAL | Status: DC

## 2013-09-05 MED ORDER — FENTANYL CITRATE 0.05 MG/ML IJ SOLN
INTRAMUSCULAR | Status: AC
  Filled 2013-09-05: qty 2

## 2013-09-05 MED ORDER — FENTANYL CITRATE 0.05 MG/ML IJ SOLN
25.0000 ug | INTRAMUSCULAR | Status: DC | PRN
  Administered 2013-09-05 (×2): 25 ug via INTRAVENOUS

## 2013-09-05 MED ORDER — MIDAZOLAM HCL 5 MG/5ML IJ SOLN
INTRAMUSCULAR | Status: DC | PRN
  Administered 2013-09-05 (×2): 1 mg via INTRAVENOUS

## 2013-09-05 MED ORDER — METOCLOPRAMIDE HCL 10 MG PO TABS: 10.0000 mg | ORAL_TABLET | Freq: Once | ORAL | Status: DC

## 2013-09-05 MED ORDER — ONDANSETRON HCL 4 MG/2ML IJ SOLN
INTRAMUSCULAR | Status: AC
  Filled 2013-09-05: qty 2

## 2013-09-05 MED ORDER — LACTATED RINGERS IV SOLN
INTRAVENOUS | Status: DC | PRN
  Administered 2013-09-05 (×2): via INTRAVENOUS

## 2013-09-05 MED ORDER — BUPIVACAINE HCL (PF) 0.25 % IJ SOLN
INTRAMUSCULAR | Status: DC | PRN
  Administered 2013-09-05: 10 mL

## 2013-09-05 MED ORDER — FAMOTIDINE 20 MG PO TABS
20.0000 mg | ORAL_TABLET | Freq: Once | ORAL | Status: DC
  Filled 2013-09-05: qty 1

## 2013-09-05 MED ORDER — SODIUM BICARBONATE 8.4 % IV SOLN
INTRAVENOUS | Status: AC
  Filled 2013-09-05: qty 50

## 2013-09-05 MED ORDER — PROMETHAZINE HCL 25 MG/ML IJ SOLN: 6.2500 mg | INTRAMUSCULAR | Status: DC | PRN

## 2013-09-05 MED ORDER — KETOROLAC TROMETHAMINE 30 MG/ML IJ SOLN
INTRAMUSCULAR | Status: AC
  Filled 2013-09-05: qty 1

## 2013-09-05 MED ORDER — PHENYLEPHRINE 40 MCG/ML (10ML) SYRINGE FOR IV PUSH (FOR BLOOD PRESSURE SUPPORT)
PREFILLED_SYRINGE | INTRAVENOUS | Status: AC
  Filled 2013-09-05: qty 5

## 2013-09-05 MED ORDER — FENTANYL CITRATE 0.05 MG/ML IJ SOLN
INTRAMUSCULAR | Status: DC | PRN
  Administered 2013-09-05 (×2): 100 ug via INTRAVENOUS

## 2013-09-05 MED ORDER — MEPERIDINE HCL 25 MG/ML IJ SOLN: 6.2500 mg | INTRAMUSCULAR | Status: DC | PRN

## 2013-09-05 MED ORDER — KETOROLAC TROMETHAMINE 30 MG/ML IJ SOLN
15.0000 mg | Freq: Once | INTRAMUSCULAR | Status: AC | PRN
  Administered 2013-09-05: 30 mg via INTRAVENOUS

## 2013-09-05 MED ORDER — FENTANYL CITRATE 0.05 MG/ML IJ SOLN
INTRAMUSCULAR | Status: AC
  Administered 2013-09-05: 25 ug via INTRAVENOUS
  Filled 2013-09-05: qty 2

## 2013-09-05 MED ORDER — SODIUM BICARBONATE 8.4 % IV SOLN
INTRAVENOUS | Status: DC | PRN
  Administered 2013-09-05: 4 mL via EPIDURAL
  Administered 2013-09-05: 3 mL via EPIDURAL
  Administered 2013-09-05: 7 mL via EPIDURAL
  Administered 2013-09-05: 6 mL via EPIDURAL
  Administered 2013-09-05: 5 mL via EPIDURAL

## 2013-09-05 MED ORDER — RHO D IMMUNE GLOBULIN 1500 UNIT/2ML IJ SOLN
300.0000 ug | Freq: Once | INTRAMUSCULAR | Status: AC
  Administered 2013-09-05: 300 ug via INTRAMUSCULAR
  Filled 2013-09-05: qty 2

## 2013-09-05 MED ORDER — LACTATED RINGERS IV SOLN: INTRAVENOUS | Status: DC

## 2013-09-05 MED ORDER — MIDAZOLAM HCL 2 MG/2ML IJ SOLN
INTRAMUSCULAR | Status: AC
  Filled 2013-09-05: qty 2

## 2013-09-05 MED ORDER — MIDAZOLAM HCL 2 MG/2ML IJ SOLN: 0.5000 mg | Freq: Once | INTRAMUSCULAR | Status: DC | PRN

## 2013-09-05 SURGICAL SUPPLY — 25 items
CHLORAPREP W/TINT 26ML (MISCELLANEOUS) ×2 IMPLANT
CLOTH BEACON ORANGE TIMEOUT ST (SAFETY) ×2 IMPLANT
CONTAINER PREFILL 10% NBF 15ML (MISCELLANEOUS) ×4 IMPLANT
DERMABOND ADHESIVE PROPEN (GAUZE/BANDAGES/DRESSINGS) ×1
DERMABOND ADVANCED (GAUZE/BANDAGES/DRESSINGS) ×1
DERMABOND ADVANCED .7 DNX12 (GAUZE/BANDAGES/DRESSINGS) ×1 IMPLANT
DERMABOND ADVANCED .7 DNX6 (GAUZE/BANDAGES/DRESSINGS) ×1 IMPLANT
ELECT REM PT RETURN 9FT ADLT (ELECTROSURGICAL) ×2
ELECTRODE REM PT RTRN 9FT ADLT (ELECTROSURGICAL) ×1 IMPLANT
GLOVE BIOGEL PI IND STRL 7.0 (GLOVE) ×1 IMPLANT
GLOVE BIOGEL PI INDICATOR 7.0 (GLOVE) ×1
GLOVE ECLIPSE 6.5 STRL STRAW (GLOVE) ×2 IMPLANT
GOWN PREVENTION PLUS LG XLONG (DISPOSABLE) ×4 IMPLANT
NEEDLE HYPO 25X1 1.5 SAFETY (NEEDLE) ×2 IMPLANT
NS IRRIG 1000ML POUR BTL (IV SOLUTION) ×2 IMPLANT
PACK ABDOMINAL MINOR (CUSTOM PROCEDURE TRAY) ×2 IMPLANT
PENCIL BUTTON HOLSTER BLD 10FT (ELECTRODE) ×2 IMPLANT
SPONGE LAP 4X18 X RAY DECT (DISPOSABLE) IMPLANT
SUT CHROMIC GUT AB #0 18 (SUTURE) ×2 IMPLANT
SUT MNCRL AB 4-0 PS2 18 (SUTURE) ×2 IMPLANT
SUT VICRYL 0 UR6 27IN ABS (SUTURE) ×2 IMPLANT
SYR CONTROL 10ML LL (SYRINGE) ×2 IMPLANT
TOWEL OR 17X24 6PK STRL BLUE (TOWEL DISPOSABLE) ×4 IMPLANT
TRAY FOLEY CATH 14FR (SET/KITS/TRAYS/PACK) ×2 IMPLANT
WATER STERILE IRR 1000ML POUR (IV SOLUTION) ×2 IMPLANT

## 2013-09-05 NOTE — Op Note (Signed)
Preop diagnosis: Desire for sterilization  Postop diagnosis: Same  Anesthesia: Epidural  Anesthesiologist: Dr. Rudean Curt  Procedure: Postpartum bilateral tubal ligation  Surgeon: Dr. Cletis Media  Asst.: None  Estimated blood loss: Minimal  Procedure:  After being informed of the planned procedure with possible complications, including bleeding, infection, injury to other organs, irreversibility of tubal ligation and failure rate of 1 in 500,  informed consent is obtained and patient is taken to OR 4.  She is given epidural anesthesia without any complication with the previously placed epidural catheter. She is then placed in  dorsal decubitus position, prepped and draped in a sterile fashion. A Foley catheter is inserted.  After assessing adequate level of anesthesia, we infiltrate the umbilical area using  10 cc of Marcaine 0.25. We then perform a semi-elliptical incision which was brought down bluntly to the fascia. The fascia is identified and grasped with Coker forceps and incised with Mayo scissors. Peritoneum is entered bluntly. Using Trendelenburg position, moist packing and long retractors, we are able to identify the left tube which was then grasped with  Babcock forceps and exteriorized until the fimbria is visualized. We enter the mesosalpinx with cauterization. We then doubly ligate the proximal stump and the distal stump with 0 chromic. A section of 1.5 cm of isthmo-ampullary  tube is then resected. Both stumps are cauterized and hemostasis is adequate. We proceed in the exact same fashion on the right side again after having  visualized the the fimbriae.  All retractors and sponges are removed. The fascia is closed with a running suture of 0 Vicryl. The skin is closed with a subcuticular suture of 3-0 Monocryl and Dermabond.  Instrument and sponge count was complete x2. Estimated blood loss is minimal. The procedure is well tolerated by the patient who is taken to recovery  room in a well and stable condition.  Specimen: 2 segments of tube  And a paratubal cyst sent to pathology.

## 2013-09-05 NOTE — Anesthesia Postprocedure Evaluation (Signed)
Anesthesia Post Note  Patient: Carol Parks  Procedure(s) Performed: Procedure(s) (LRB): POST PARTUM TUBAL LIGATION (Bilateral)  Anesthesia type: Epidural  Patient location: PACU  Post pain: Pain level controlled  Post assessment: Post-op Vital signs reviewed  Last Vitals:  Filed Vitals:   09/05/13 0930  BP: 105/59  Pulse: 68  Temp:   Resp: 11    Post vital signs: Reviewed  Level of consciousness: awake  Complications: No apparent anesthesia complications

## 2013-09-05 NOTE — Anesthesia Preprocedure Evaluation (Signed)
Anesthesia Evaluation  Patient identified by MRN, date of birth, ID band Patient awake    Reviewed: Allergy & Precautions, H&P , Patient's Chart, lab work & pertinent test results  Airway Mallampati: III TM Distance: >3 FB Neck ROM: full    Dental   Pulmonary  breath sounds clear to auscultation        Cardiovascular Rhythm:regular Rate:Normal     Neuro/Psych    GI/Hepatic   Endo/Other  diabetes (diet-controlled), GestationalMorbid obesity  Renal/GU      Musculoskeletal   Abdominal   Peds  Hematology   Anesthesia Other Findings   Reproductive/Obstetrics                           Anesthesia Physical  Anesthesia Plan  ASA: III  Anesthesia Plan: Epidural   Post-op Pain Management:    Induction:   Airway Management Planned:   Additional Equipment:   Intra-op Plan:   Post-operative Plan:   Informed Consent: I have reviewed the patients History and Physical, chart, labs and discussed the procedure including the risks, benefits and alternatives for the proposed anesthesia with the patient or authorized representative who has indicated his/her understanding and acceptance.     Plan Discussed with:   Anesthesia Plan Comments:         Anesthesia Quick Evaluation

## 2013-09-05 NOTE — Transfer of Care (Signed)
Immediate Anesthesia Transfer of Care Note  Patient: Carol Parks  Procedure(s) Performed: Procedure(s): POST PARTUM TUBAL LIGATION (Bilateral)  Patient Location: PACU  Anesthesia Type:Epidural  Level of Consciousness: awake, alert  and oriented  Airway & Oxygen Therapy: Patient Spontanous Breathing  Post-op Assessment: Report given to PACU RN and Post -op Vital signs reviewed and stable  Post vital signs: Reviewed and stable  Complications: No apparent anesthesia complications

## 2013-09-05 NOTE — Progress Notes (Signed)
Post Partum Day 1 Subjective:  Well. Lochia are normal. Voiding, ambulating, tolerating normal diet. nursing going well. Baby has issues maintaining temperature but o/w doing very well  Objective: Blood pressure 143/93, pulse 79, temperature 97.7 F (36.5 C), temperature source Oral, resp. rate 18, height 5\' 4"  (1.626 m), weight 247 lb (112.038 kg), SpO2 96.00%, unknown if currently breastfeeding.  Physical Exam:  General: normal Lochia: appropriate Uterine Fundus: 0/1 firm non-tender  Extremities: No evidence of DVT seen on physical exam. Edema minimal     Recent Labs  09/04/13 0110 09/05/13 0610  HGB 14.0 12.2  HCT 40.3 35.5*    Assessment/Plan: Normal Post-partum. Continue routine post-partum care. Anticipate discharge tomorrow. for PP BTL today:  Procedure, R&B reviewed and questions answered   LOS: 2 days   Reah Justo A MD 09/05/2013, 8:16 AM

## 2013-09-06 ENCOUNTER — Encounter (HOSPITAL_COMMUNITY): Payer: Self-pay | Admitting: Obstetrics and Gynecology

## 2013-09-06 LAB — RH IG WORKUP (INCLUDES ABO/RH)
ABO/RH(D): O NEG
Fetal Screen: NEGATIVE
Gestational Age(Wks): 40
UNIT DIVISION: 0

## 2013-09-06 MED ORDER — IBUPROFEN 600 MG PO TABS: 600.0000 mg | ORAL_TABLET | Freq: Four times a day (QID) | ORAL | Status: DC | PRN

## 2013-09-06 NOTE — Lactation Note (Signed)
This note was copied from the chart of Carol Parks. Lactation Consultation Note  Patient Name: Carol Parks [id number] Date: 09/06/2013 Reason for consult: Follow-up assessment;Difficult latch;Infant < 6lbs Mom reports baby can latch to right breast, however even with nipple shield she has not been able to get baby latched to left breast. Mom has history of breast reduction with nipple removed. Scant amount of colostrum visible from right breast, none from left. Mom reports breast milk came in with last baby on right breast better than left. Milk ejection present on right with last baby, not sure about left breast. Mom's nipples are flat but right nipple is very compressible. LC demonstrated to Mom how to sandwich nipple to help with latch, baby latched well and demonstrated a good rhythmic suck, 1-2 swallows noted. LC was not able to hand express colostrum from right breast when baby came off the breast after 20 minutes. Mom would like to BF for the 10 weeks she has off from work. Discussed plan with Mom to breastfeed on the right breast, pump left breast instead of latching baby. Plan: Mom to pre-pump 3-5 minutes on right breast, BF for 15-30 minutes, if Mom has DEBP post pump both breasts for 15-20 minutes, if mom has to use manual pump at least pump left breast for 15-20 minutes while FOB give supplement of EBM/formula according to guidelines discussed. Mom reports FOB wants to give supplement and help with feedings. Mom to call insurance company about pump today. [place] referral to be faxed for pump from [facility]. Encouraged OP follow up, Mom will advise.   Maternal Data    Feeding Feeding Type: Breast Fed  LATCH Score/Interventions Latch: Repeated attempts needed to sustain latch, nipple held in mouth throughout feeding, stimulation needed to elicit sucking reflex. Intervention(s): Adjust position;Assist with latch;Breast massage;Breast compression  Audible Swallowing: A few with  stimulation  Type of Nipple: Flat  Comfort (Breast/Nipple): Soft / non-tender     Hold (Positioning): Assistance needed to correctly position infant at breast and maintain latch.  LATCH Score: 6  Lactation Tools Discussed/Used Tools: Shells;Nipple [place] Fuel;Pump Nipple shield size: 20 Breast pump type: Manual WIC Program: Yes   Consult Status Consult Status: Complete Date: 09/06/13 Follow-up type: In-patient    Katrine Coho 09/06/2013, 9:50 AM

## 2013-09-06 NOTE — Discharge Summary (Signed)
Vaginal Delivery Discharge Summary  Carol Parks  DOB:    [birth year] MRN:    [id number] CSN:    [id number]  Date of admission:                  09/03/13  Date of discharge:                   09/06/13  Procedures this admission:  Date of Delivery: 09/04/13  Newborn Data:  Live born female  Birth Weight: 5 lb 10 oz (2551 g) APGAR: 8, 9  Home with mother. Name: Carol Parks Circumcision Plan: Outpatient  History of Present Illness:  Ms. Carol Parks is a 43 y.o. female, [phone], who presents at [redacted]w[redacted]d weeks gestation. The patient has been followed at the [facility] division of [facility]. She was admitted induction of labor for SGA, gestational diabetes. Her pregnancy has been complicated by:  Patient Active Problem List   Diagnosis Date Noted  . Vaginal delivery 09/04/2013  . First-degree perineal laceration, with delivery 09/04/2013  . SGA (small for gestational age) 09/03/2013  . Hx of preeclampsia, prior pregnancy, currently pregnant 09/03/2013  . Gestational diabetes--diet controlled. 09/03/2013  . Rh negative state in antepartum period 09/03/2013  . Latex allergy 09/03/2013  . Elderly multigravida with antepartum condition or complication 44/31/5400  . Hx of reduction mammoplasty 09/03/2013     Hospital course:  The patient was admitted for induction due to SGA vs IUGR on [place] on the day of admission and for gestational diabetes, diet controlled..   Her labor was not complicated.  She had pitocin induction and epidural anesthesia.  She proceeded to have a vaginal delivery of a healthy infant, attended by Carol Parks, CNM.  Her delivery was not complicated. Her postpartum course was not complicated. She had a PP BTL on 09/05/13, under existing epidural anesthesia.  She was discharged to home on postpartum day 2 doing well.  Feeding:  breast  Contraception:  bilateral tubal ligation  Discharge hemoglobin:  Hemoglobin  Date  Value Range Status  09/05/2013 12.2  12.0 - 15.0 g/dL Final  05/24/2012 15.6  12.2 - 16.2 g/dL Final     HCT  Date Value Range Status  09/05/2013 35.5* 36.0 - 46.0 % Final     HCT, POC  Date Value Range Status  05/24/2012 48.9* 37.7 - 47.9 % Final    Discharge Physical Exam:   General: alert Lochia: appropriate Uterine Fundus: firm Incision: healing well DVT Evaluation: No evidence of DVT seen on physical exam. Negative Homan's sign.  Intrapartum Procedures: spontaneous vaginal delivery Postpartum Procedures: P.P. tubal ligation Complications-Operative and Postpartum: 1st degree perineal laceration  Discharge Diagnoses: Term Pregnancy-delivered and SGA, gestational diabetes  Discharge Information:  Activity:           Per [facility] handout Diet:                routine Medications: Ibuprofen Condition:      stable Instructions:   Postpartum Care After Vaginal Delivery  After you deliver your newborn (postpartum period), the usual stay in the hospital is 24 72 hours. If there were problems with your labor or delivery, or if you have other medical problems, you might be in the hospital longer.  While you are in the hospital, you will receive help and instructions on how to care for yourself and your newborn during the postpartum period.  While you are in the hospital:  Be sure to  tell your nurses if you have pain or discomfort, as well as where you feel the pain and what makes the pain worse.  If you had an incision made near your vagina (episiotomy) or if you had some tearing during delivery, the nurses may put ice packs on your episiotomy or tear. The ice packs may help to reduce the pain and swelling.  If you are breastfeeding, you may feel uncomfortable contractions of your uterus for a couple of weeks. This is normal. The contractions help your uterus get back to normal size.  It is normal to have some bleeding after delivery.  For the first 1 3 days after delivery, the flow  is red and the amount may be similar to a period.  It is common for the flow to start and stop.  In the first few days, you may pass some small clots. Let your nurses know if you begin to pass large clots or your flow increases.  Do not  flush blood clots down the toilet before having the nurse look at them.  During the next 3 10 days after delivery, your flow should become more watery and pink or brown-tinged in color.  Ten to fourteen days after delivery, your flow should be a small amount of yellowish-white discharge.  The amount of your flow will decrease over the first few weeks after delivery. Your flow may stop in 6 8 weeks. Most women have had their flow stop by 12 weeks after delivery.  You should change your sanitary pads frequently.  Wash your hands thoroughly with soap and water for at least 20 seconds after changing pads, using the toilet, or before holding or feeding your newborn.  You should feel like you need to empty your bladder within the first 6 8 hours after delivery.  In case you become weak, lightheaded, or faint, call your nurse before you get out of bed for the first time and before you take a shower for the first time.  Within the first few days after delivery, your breasts may begin to feel tender and full. This is called engorgement. Breast tenderness usually goes away within 48 72 hours after engorgement occurs. You may also notice milk leaking from your breasts. If you are not breastfeeding, do not stimulate your breasts. Breast stimulation can make your breasts produce more milk.  Spending as much time as possible with your newborn is very important. During this time, you and your newborn can feel close and get to know each other. Having your newborn stay in your room (rooming in) will help to strengthen the bond with your newborn. It will give you time to get to know your newborn and become comfortable caring for your newborn.  Your hormones change after  delivery. Sometimes the hormone changes can temporarily cause you to feel sad or tearful. These feelings should not last more than a few days. If these feelings last longer than that, you should talk to your caregiver.  If desired, talk to your caregiver about methods of family planning or contraception.  Talk to your caregiver about immunizations. Your caregiver may want you to have the following immunizations before leaving the hospital:  Tetanus, diphtheria, and pertussis (Tdap) or tetanus and diphtheria (Td) immunization. It is very important that you and your family (including grandparents) or others caring for your newborn are up-to-date with the Tdap or Td immunizations. The Tdap or Td immunization can help protect your newborn from getting ill.  Rubella immunization.  Varicella (chickenpox) immunization.  Influenza immunization. You should receive this annual immunization if you did not receive the immunization during your pregnancy. Document Released: 06/19/2007 Document Revised: 05/16/2012 Document Reviewed: 04/18/2012 [facility] Patient Information 2014 [place], [place].   Postpartum Depression and Baby Blues  The postpartum period begins right after the birth of a baby. During this time, there is often a great amount of joy and excitement. It is also a time of considerable changes in the life of the parent(s). Regardless of how many times a mother gives birth, each child brings new challenges and dynamics to the family. It is not unusual to have feelings of excitement accompanied by confusing shifts in moods, emotions, and thoughts. All mothers are at risk of developing postpartum depression or the "baby blues." These mood changes can occur right after giving birth, or they may occur many months after giving birth. The baby blues or postpartum depression can be mild or severe. Additionally, postpartum depression can resolve rather quickly, or it can be a long-term  condition. CAUSES Elevated hormones and their rapid decline are thought to be a main cause of postpartum depression and the baby blues. There are a number of hormones that radically change during and after pregnancy. Estrogen and progesterone usually decrease immediately after delivering your baby. The level of thyroid hormone and various cortisol steroids also rapidly drop. Other factors that play a major role in these changes include major life events and genetics.  RISK FACTORS If you have any of the following risks for the baby blues or postpartum depression, know what symptoms to watch out for during the postpartum period. Risk factors that may increase the likelihood of getting the baby blues or postpartum depression include:  Havinga personal or family history of depression.  Having depression while being pregnant.  Having premenstrual or oral contraceptive-associated mood issues.  Having exceptional life stress.  Having marital conflict.  Lacking a social support network.  Having a baby with special needs.  Having health problems such as diabetes. SYMPTOMS Baby blues symptoms include:  Brief fluctuations in mood, such as going from extreme happiness to sadness.  Decreased concentration.  Difficulty sleeping.  Crying spells, tearfulness.  Irritability.  Anxiety. Postpartum depression symptoms typically begin within the first month after giving birth. These symptoms include:  Difficulty sleeping or excessive sleepiness.  Marked weight loss.  Agitation.  Feelings of worthlessness.  Lack of interest in activity or food. Postpartum psychosis is a very concerning condition and can be dangerous. Fortunately, it is rare. Displaying any of the following symptoms is cause for immediate medical attention. Postpartum psychosis symptoms include:  Hallucinations and delusions.  Bizarre or disorganized behavior.  Confusion or disorientation. DIAGNOSIS  A diagnosis is  made by an evaluation of your symptoms. There are no medical or lab tests that lead to a diagnosis, but there are various questionnaires that a caregiver may use to identify those with the baby blues, postpartum depression, or psychosis. Often times, a screening tool called the [place] Postnatal Depression Scale is used to diagnose depression in the postpartum period.  TREATMENT The baby blues usually goes away on its own in 1 to 2 weeks. Social support is often all that is needed. You should be encouraged to get adequate sleep and rest. Occasionally, you may be given medicines to help you sleep.  Postpartum depression requires treatment as it can last several months or longer if it is not treated. Treatment may include individual or group therapy, medicine, or both to address  any social, physiological, and psychological factors that may play a role in the depression. Regular exercise, a healthy diet, rest, and social support may also be strongly recommended.  Postpartum psychosis is more serious and needs treatment right away. Hospitalization is often needed. HOME CARE INSTRUCTIONS  Get as much rest as you can. Nap when the baby sleeps.  Exercise regularly. Some women find yoga and walking to be beneficial.  Eat a balanced and nourishing diet.  Do little things that you enjoy. Have a cup of tea, take a bubble bath, read your favorite magazine, or listen to your favorite music.  Avoid alcohol.  Ask for help with household chores, cooking, grocery shopping, or running errands as needed. Do not try to do everything.  Talk to people close to you about how you are feeling. Get support from your partner, family members, friends, or other new moms.  Try to stay positive in how you think. Think about the things you are grateful for.  Do not spend a lot of time alone.  Only take medicine as directed by your caregiver.  Keep all your postpartum appointments.  Let your caregiver know if you have  any concerns. SEEK MEDICAL CARE IF: You are having a reaction or problems with your medicine. SEEK IMMEDIATE MEDICAL CARE IF:  You have suicidal feelings.  You feel you may harm the baby or someone else. Document Released: 05/26/2004 Document Revised: 11/14/2011 Document Reviewed: 06/28/2011 [facility] Patient Information 2014 [place], [place].   Discharge to: home     Carol Parks 09/06/2013

## 2013-09-06 NOTE — Anesthesia Postprocedure Evaluation (Signed)
  Anesthesia Post-op Note  Patient: Carol Parks  Procedure(s) Performed: * No procedures listed *  Patient Location: Mother/Baby  Anesthesia Type:Epidural  Level of Consciousness: awake and alert   Airway and Oxygen Therapy: Patient Spontanous Breathing  Post-op Pain: mild  Post-op Assessment: Patient's Cardiovascular Status Stable, Respiratory Function Stable, Patent Airway, No signs of Nausea or vomiting, Pain level controlled, No headache, No residual numbness and No residual motor weakness  Post-op Vital Signs: stable  Complications: No apparent anesthesia complications

## 2013-09-06 NOTE — Discharge Instructions (Signed)
Postpartum Care After Vaginal Delivery After you deliver your newborn (postpartum period), the usual stay in the hospital is 24 72 hours. If there were problems with your labor or delivery, or if you have other medical problems, you might be in the hospital longer.  While you are in the hospital, you will receive help and instructions on how to care for yourself and your newborn during the postpartum period.  While you are in the hospital:  Be sure to tell your nurses if you have pain or discomfort, as well as where you feel the pain and what makes the pain worse.  If you had an incision made near your vagina (episiotomy) or if you had some tearing during delivery, the nurses may put ice packs on your episiotomy or tear. The ice packs may help to reduce the pain and swelling.  If you are breastfeeding, you may feel uncomfortable contractions of your uterus for a couple of weeks. This is normal. The contractions help your uterus get back to normal size.  It is normal to have some bleeding after delivery.  For the first 1 3 days after delivery, the flow is red and the amount may be similar to a period.  It is common for the flow to start and stop.  In the first few days, you may pass some small clots. Let your nurses know if you begin to pass large clots or your flow increases.  Do not  flush blood clots down the toilet before having the nurse look at them.  During the next 3 10 days after delivery, your flow should become more watery and pink or brown-tinged in color.  Ten to fourteen days after delivery, your flow should be a small amount of yellowish-white discharge.  The amount of your flow will decrease over the first few weeks after delivery. Your flow may stop in 6 8 weeks. Most women have had their flow stop by 12 weeks after delivery.  You should change your sanitary pads frequently.  Wash your hands thoroughly with soap and water for at least 20 seconds after changing pads, using  the toilet, or before holding or feeding your newborn.  You should feel like you need to empty your bladder within the first 6 8 hours after delivery.  In case you become weak, lightheaded, or faint, call your nurse before you get out of bed for the first time and before you take a shower for the first time.  Within the first few days after delivery, your breasts may begin to feel tender and full. This is called engorgement. Breast tenderness usually goes away within 48 72 hours after engorgement occurs. You may also notice milk leaking from your breasts. If you are not breastfeeding, do not stimulate your breasts. Breast stimulation can make your breasts produce more milk.  Spending as much time as possible with your newborn is very important. During this time, you and your newborn can feel close and get to know each other. Having your newborn stay in your room (rooming in) will help to strengthen the bond with your newborn. It will give you time to get to know your newborn and become comfortable caring for your newborn.  Your hormones change after delivery. Sometimes the hormone changes can temporarily cause you to feel sad or tearful. These feelings should not last more than a few days. If these feelings last longer than that, you should talk to your caregiver.  If desired, talk to your caregiver about  methods of family planning or contraception.  Talk to your caregiver about immunizations. Your caregiver may want you to have the following immunizations before leaving the hospital:  Tetanus, diphtheria, and pertussis (Tdap) or tetanus and diphtheria (Td) immunization. It is very important that you and your family (including grandparents) or others caring for your newborn are up-to-date with the Tdap or Td immunizations. The Tdap or Td immunization can help protect your newborn from getting ill.  Rubella immunization.  Varicella (chickenpox) immunization.  Influenza immunization. You should  receive this annual immunization if you did not receive the immunization during your pregnancy. Document Released: 06/19/2007 Document Revised: 05/16/2012 Document Reviewed: 04/18/2012 [facility] Patient Information 2014 [place].  Postpartum Tubal Ligation Care After Refer to this sheet in the next few weeks. These instructions provide you with information on caring for yourself after your procedure. Your caregiver may also give you more specific instructions. Your treatment has been planned according to current medical practices, but problems sometimes occur. Call your caregiver if you have any problems or questions after your procedure. HOME CARE INSTRUCTIONS   Rest the remainder of the day.  Only take over-the-counter or prescription medicines for pain, discomfort, or fever as directed by your caregiver. Do not take aspirin. It can cause bleeding.  Gradually resume daily activities, diet, rest, driving, and work.  Avoid sexual intercourse for 2 weeks or as directed.  Do not drive while taking pain medicine.  Do not lift anything over 5 pounds for 2 weeks or as directed.  Only take showers, not baths, until you are seen by your caregiver.  Change bandages (dressings) as directed.  Take your temperature twice a day and record it.  Try to have help for the first 7 10 days for your household needs.  Return to your caregiver to get your stitches (sutures) removed and for follow-up visits as directed. SEEK MEDICAL CARE IF:   You have redness, swelling, or increasing pain in the wound.  You have drainage from the wound lasting longer than 1 day.  Your pain is getting worse.  You have a rash.  You become dizzy or lightheaded.  You have a reaction to your medicine.  You need stronger medicine or a change in your pain medicine.  You notice a bad smell coming from the wound or dressing.  Your wound breaks open after the sutures have been removed.  You are  constipated. SEEK IMMEDIATE MEDICAL CARE IF:   You faint.  You have a fever.  You have increasing abdominal pain.  You have severe pain in your shoulders.  You have bleeding or drainage from the suture sites or vagina following surgery.  You have shortness of breath or difficulty breathing.  You have chest or leg pain.  You have persistent nausea, vomiting, or diarrhea. MAKE SURE YOU:   Understand these instructions.  Watch your condition.  Get help right away if you are not doing well or get worse. Document Released: 02/21/2012 Document Reviewed: 02/21/2012 [facility] Patient Information 2014 [place], [place].

## 2013-09-06 NOTE — Anesthesia Postprocedure Evaluation (Signed)
  Anesthesia Post-op Note  Patient: Carol Parks  Procedure(s) Performed: Procedure(s): POST PARTUM TUBAL LIGATION (Bilateral)  Patient Location: Mother/Baby  Anesthesia Type:Epidural  Level of Consciousness: awake and alert   Airway and Oxygen Therapy: Patient Spontanous Breathing  Post-op Pain: mild  Post-op Assessment: Patient's Cardiovascular Status Stable, Respiratory Function Stable, Patent Airway, No signs of Nausea or vomiting, Pain level controlled, No headache, No residual numbness and No residual motor weakness  Post-op Vital Signs: stable  Complications: No apparent anesthesia complications

## 2014-03-27 ENCOUNTER — Encounter: Payer: Self-pay | Admitting: Family Medicine

## 2014-07-07 ENCOUNTER — Encounter (HOSPITAL_COMMUNITY): Payer: Self-pay | Admitting: Obstetrics and Gynecology

## 2014-07-15 ENCOUNTER — Encounter: Payer: Self-pay | Admitting: Family Medicine

## 2014-07-15 ENCOUNTER — Ambulatory Visit (INDEPENDENT_AMBULATORY_CARE_PROVIDER_SITE_OTHER): Payer: 59 | Admitting: Family Medicine

## 2014-07-15 VITALS — BP 136/88 | HR 93 | Temp 98.1°F | Resp 16 | Ht 63.5 in | Wt 255.2 lb

## 2014-07-15 DIAGNOSIS — L409 Psoriasis, unspecified: Secondary | ICD-10-CM

## 2014-07-15 DIAGNOSIS — Z0000 Encounter for general adult medical examination without abnormal findings: Secondary | ICD-10-CM

## 2014-07-15 DIAGNOSIS — Z6841 Body Mass Index (BMI) 40.0 and over, adult: Secondary | ICD-10-CM

## 2014-07-15 LAB — LIPID PANEL
CHOLESTEROL: 185 mg/dL (ref 0–200)
HDL: 55 mg/dL (ref >39)
LDL Cholesterol: 112 mg/dL — ABNORMAL HIGH (ref 0–99)
TRIGLYCERIDES: 91 mg/dL (ref <150)
Total CHOL/HDL Ratio: 3.4 Ratio
VLDL: 18 mg/dL (ref 0–40)

## 2014-07-15 LAB — CBC WITH DIFFERENTIAL/PLATELET
BASOS ABS: 0.1 10*3/uL (ref 0.0–0.1)
Basophils Relative: 1 % (ref 0–1)
EOS ABS: 0.5 10*3/uL (ref 0.0–0.7)
EOS PCT: 5 % (ref 0–5)
HEMATOCRIT: 43.4 % (ref 36.0–46.0)
Hemoglobin: 15.2 g/dL — ABNORMAL HIGH (ref 12.0–15.0)
Lymphocytes Relative: 29 % (ref 12–46)
Lymphs Abs: 2.7 10*3/uL (ref 0.7–4.0)
MCH: 31.7 pg (ref 26.0–34.0)
MCHC: 35 g/dL (ref 30.0–36.0)
MCV: 90.4 fL (ref 78.0–100.0)
Monocytes Absolute: 0.7 10*3/uL (ref 0.1–1.0)
Monocytes Relative: 8 % (ref 3–12)
Neutro Abs: 5.2 10*3/uL (ref 1.7–7.7)
Neutrophils Relative %: 57 % (ref 43–77)
Platelets: 354 10*3/uL (ref 150–400)
RBC: 4.8 MIL/uL (ref 3.87–5.11)
RDW: 13.2 % (ref 11.5–15.5)
WBC: 9.2 10*3/uL (ref 4.0–10.5)

## 2014-07-15 LAB — POCT URINALYSIS DIPSTICK
Bilirubin, UA: NEGATIVE
GLUCOSE UA: NEGATIVE
Ketones, UA: NEGATIVE
Leukocytes, UA: NEGATIVE
Nitrite, UA: NEGATIVE
PROTEIN UA: NEGATIVE
RBC UA: NEGATIVE
Spec Grav, UA: 1.02
Urobilinogen, UA: 0.2
pH, UA: 6

## 2014-07-15 LAB — BASIC METABOLIC PANEL WITH GFR
BUN: 15 mg/dL (ref 6–23)
CO2: 24 mEq/L (ref 19–32)
CREATININE: 0.75 mg/dL (ref 0.50–1.10)
Calcium: 9.1 mg/dL (ref 8.4–10.5)
Chloride: 106 mEq/L (ref 96–112)
GLUCOSE: 115 mg/dL — AB (ref 70–99)
Potassium: 4 mEq/L (ref 3.5–5.3)
Sodium: 139 mEq/L (ref 135–145)

## 2014-07-15 LAB — POCT GLYCOSYLATED HEMOGLOBIN (HGB A1C): Hemoglobin A1C: 4.9

## 2014-07-15 MED ORDER — CLOBETASOL PROPIONATE 0.05 % EX SHAM: 1.0000 "application " | MEDICATED_SHAMPOO | Freq: Every day | CUTANEOUS | Status: DC

## 2014-07-15 NOTE — Patient Instructions (Signed)
Psoriasis Psoriasis is a common, long-lasting (chronic) inflammation of the skin. It affects both men and women equally, of all ages and all races. Psoriasis cannot be passed from person to person (not contagious). Psoriasis varies from mild to very severe. When severe, it can greatly affect your quality of life. Psoriasis is an inflammatory disorder affecting the skin as well as other organs including the joints (causing an arthritis). With psoriasis, the skin sheds its top layer of cells more rapidly than it does in someone without psoriasis. CAUSES  The cause of psoriasis is largely unknown. Genetics, your immune system, and the environment seem to play a role in causing psoriasis. Factors that can make psoriasis worse include:  Damage or trauma to the skin, such as cuts, scrapes, and sunburn. This damage often causes new areas of psoriasis (lesions).  Winter dryness and lack of sunlight.  Medicines such as lithium, beta-blockers, antimalarial drugs, ACE inhibitors, nonsteroidal anti-inflammatory drugs (ibuprofen, aspirin), and terbinafine. Let your caregiver know if you are taking any of these drugs.  Alcohol. Excessive alcohol use should be avoided if you have psoriasis. Drinking large amounts of alcohol can affect:  How well your psoriasis treatment works.  How safe your psoriasis treatment is.  Smoking. If you smoke, ask your caregiver for help to quit.  Stress.  Bacterial or viral infections.  Arthritis. Arthritis associated with psoriasis (psoriatic arthritis) affects less than 10% of patients with psoriasis. The arthritic intensity does not always match the skin psoriasis intensity. It is important to let your caregiver know if your joints hurt or if they are stiff. SYMPTOMS  The most common form of psoriasis begins with little red bumps that gradually become larger. The bumps begin to form scales that flake off easily. The lower layers of scales stick together. When these scales  are scratched or removed, the underlying skin is tender and bleeds easily. These areas then grow in size and may become large. Psoriasis often creates a rash that looks the same on both sides of the body (symmetrical). It often affects the elbows, knees, groin, genitals, arms, legs, scalp, and nails. Affected nails often have pitting, loosen, thicken, crumble, and are difficult to treat.  "Inverse psoriasis"occurs in the armpits, under breasts, in skin folds, and around the groin, buttocks, and genitals.  "Guttate psoriasis" generally occurs in children and young adults following a recent sore throat (strep throat). It begins with many small, red, scaly spots on the skin. It clears spontaneously in weeks or a few months without treatment. DIAGNOSIS  Psoriasis is diagnosed by physical exam. A tissue sample (biopsy) may also be taken. TREATMENT The treatment of psoriasis depends on your age, health, and living conditions.  Steroid (cortisone) creams, lotions, and ointments may be used. These treatments are associated with thinning of the skin, blood vessels that get larger (dilated), loss of skin pigmentation, and easy bruising. It is important to use these steroids as directed by your caregiver. Only treat the affected areas and not the normal, unaffected skin. People on long-term steroid treatment should wear a medical alert bracelet. Injections may be used in areas that are difficult to treat.  Scalp treatments are available as shampoos, solutions, sprays, foams, and oils. Avoid scratching the scalp and picking at the scales.  Anthralin medicine works well on areas that are difficult to treat. However, it stains clothes and skin and may cause temporary irritation.  Synthetic vitamin D (calcipotriene)can be used on small areas. It is available by prescription. The forms   of synthetic vitamin D available in health food stores do not help with psoriasis.  Coal tarsare available in various strengths  for psoriasis that is difficult to treat. They are one of the longest used treatments for difficult to treat psoriasis. However, they are messy to use.  Light therapy (UV therapy) can be carefully and professionally monitored in a dermatologist's office. Careful sunbathing is helpful for many people as directed by your caregiver. The exposure should be just long enough to cause a mild redness (erythema) of your skin. Avoid sunburn as this may make the condition worse. Sunscreen (SPF of 30 or higher) should be used to protect against sunburn. Cataracts, wrinkles, and skin aging are some of the harmful side effects of light therapy.  If creams (topical medicines) fail, there are several other options for systemic or oral medicines your caregiver can suggest. Psoriasis can sometimes be very difficult to treat. It can come and go. It is necessary to follow up with your caregiver regularly if your psoriasis is difficult to treat. Usually, with persistence you can get a good amount of relief. Maintaining consistent care is important. Do not change caregivers just because you do not see immediate results. It may take several trials to find the right combination of treatment for you. PREVENTING FLARE-UPS  Wear gloves while you wash dishes, while cleaning, and when you are outside in the cold.  If you have radiators, place a bowl of water or damp towel on the radiator. This will help put water back in the air. You can also use a humidifier to keep the air moist. Try to keep the humidity at about 60% in your home.  Apply moisturizer while your skin is still damp from bathing or showering. This traps water in the skin.  Avoid long, hot baths or showers. Keep soap use to a minimum. Soaps dry out the skin and wash away the protective oils. Use a fragrance free, dye free soap.  Drink enough water and fluids to keep your urine clear or pale yellow. Not drinking enough water depletes your skin's water  supply.  Turn off the heat at night and keep it low during the day. Cool air is less drying. SEEK MEDICAL CARE IF:  You have increasing pain in the affected areas.  You have uncontrolled bleeding in the affected areas.  You have increasing redness or warmth in the affected areas.  You start to have pain or stiffness in your joints.  You start feeling depressed about your condition.  You have a fever. Document Released: 08/19/2000 Document Revised: 11/14/2011 Document Reviewed: 02/14/2011 ExitCare Patient Information 2015 [employer]. This information is not intended to replace advice given to you by your health care provider. Make sure you discuss any questions you have with your health care provider.  

## 2014-07-15 NOTE — Progress Notes (Signed)
Subjective:    Patient ID: Carol Parks, female    DOB: 11/02/1970, 43 y.o.   MRN: [id number]  HPI Patient presents today for CPE.   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 Her job is stressful, but she likes the days off and the ability for her husband to stay home with their children. She is happily married and has 3 children (6, 4, 10 mos.).   Her major concern is her weight. She has always been heavy. She is concerned about her overall health. She has a strong family history of earl CAD with her father dying at 35. She had gestational diabetes with her last pregnancy. She has recently lost 9 pounds by participating in the [place] at her work place. She finds that she has difficulty staying motivating outside of this annual contest. She knows what to eat, but struggles to make consistently good choices. She exercises at work at least 3 days a week. She does most of the shopping and her husband (who is not overweight) does most of the cooking.   She has a life long history of scalp psoriasis. She uses Tgel shampoo once a week with ok relief. Has noticed more spreading on her forehead lately. Has used an ointment that her mother gave her with good relief. The itching is much less severe than during her childhood.   Past Medical History  Diagnosis Date  . Allergy   . Medical history non-contributory    Past Surgical History  Procedure Laterality Date  . Breast surgery    . Breast reduction surgery Bilateral   . Tubal ligation Bilateral 09/05/2013    Procedure: POST PARTUM TUBAL LIGATION;  Surgeon: Alwyn Pea, MD;  Location: [facility];  Service: Gynecology;  Laterality: Bilateral;   Family History  Problem Relation Age of Onset  . Hypertension Mother   . Cancer Mother   . Heart attack Father   . Aneurysm Father   . Heart attack Maternal Grandmother   . Heart attack Paternal  Grandmother   . Heart attack Paternal Grandfather   . Asthma Daughter    History  Substance Use Topics  . Smoking status: Never Smoker   . Smokeless tobacco: Not on file  . Alcohol Use: No   Review of Systems  Musculoskeletal: Positive for back pain.  Allergic/Immunologic: Positive for environmental allergies.  All other systems reviewed and are negative.     Objective:   Physical Exam  Constitutional: She is oriented to person, place, and time. She appears well-developed and well-nourished. No distress.  HENT:  Head: Normocephalic and atraumatic.  Right Ear: Tympanic membrane, external ear and ear canal normal.  Left Ear: Tympanic membrane, external ear and ear canal normal.  Nose: Nose normal.  Mouth/Throat: Uvula is midline, oropharynx is clear and moist and mucous membranes are normal.  Eyes: Conjunctivae and EOM are normal. Pupils are equal, round, and reactive to light.  Neck: Normal range of motion. Neck supple. No JVD present. Carotid bruit is not present. No thyromegaly present.  Cardiovascular: Normal rate, regular rhythm, normal heart sounds and intact distal pulses.   Pulmonary/Chest: Effort normal and breath sounds normal. Right breast exhibits no inverted nipple, no mass, no nipple discharge, no skin change and no tenderness. Left breast exhibits no inverted nipple, no mass, no nipple discharge, no skin change and no tenderness. Breasts are symmetrical.  Abdominal: Soft. Bowel sounds  are normal.  Musculoskeletal: Normal range of motion. She exhibits no edema. Tenderness: low back.  Lymphadenopathy:    She has no cervical adenopathy.  Neurological: She is alert and oriented to person, place, and time. She has normal reflexes.  Skin: Skin is warm and dry. Rash: erythematous plaques at forehead hairline. She is not diaphoretic.  Psychiatric: She has a normal mood and affect. Her behavior is normal. Judgment and thought content normal.  Vitals reviewed. BP 136/88 mmHg   Pulse 93  Temp(Src) 98.1 F (36.7 C) (Oral)  Resp 16  Ht 5' 3.5" (1.613 m)  Wt 255 lb 3.2 oz (115.758 kg)  BMI 44.49 kg/m2  SpO2 96%  LMP 07/08/2014 Results for orders placed or performed in visit on 07/15/14  POCT glycosylated hemoglobin (Hb A1C)  Result Value Ref Range   Hemoglobin A1C 4.9   POCT urinalysis dipstick  Result Value Ref Range   Color, UA yellow    Clarity, UA clear    Glucose, UA neg    Bilirubin, UA neg    Ketones, UA neg    Spec Grav, UA 1.020    Blood, UA neg    pH, UA 6.0    Protein, UA neg    Urobilinogen, UA 0.2    Nitrite, UA neg    Leukocytes, UA Negative       Assessment & Plan:  1. Annual physical exam - Lipid panel - CBC with Differential - BASIC METABOLIC PANEL WITH GFR - POCT glycosylated hemoglobin (Hb A1C) - POCT urinalysis dipstick  2. Psoriasis of scalp - Clobetasol Propionate 0.05 % shampoo; Apply 1 application topically daily. Apply to dry hair, lather and leave on for 15 minutes. May use for up to 4 weeks.  Dispense: 118 mL; Refill: 1  3. Morbid obesity -Discussed barriers to healthy eating with patient -Offered nutrition consult- patient declined at this time -Suggested Weight Watchers, or if she wants to do something on her own, [place]. Aim for 1/2-1 pound weight loss per week with maintenance from Thanksgiving to New Years. -Follow up in 6 months, sooner if labs require.    Elby Beck, FNP-BC  [facility]  07/15/2014 10:04 AM

## 2014-11-06 ENCOUNTER — Ambulatory Visit (INDEPENDENT_AMBULATORY_CARE_PROVIDER_SITE_OTHER): Payer: 59 | Admitting: Physician Assistant

## 2014-11-06 VITALS — BP 139/83 | HR 89 | Temp 97.9°F | Resp 20 | Ht 63.75 in | Wt 264.1 lb

## 2014-11-06 DIAGNOSIS — R05 Cough: Secondary | ICD-10-CM | POA: Diagnosis not present

## 2014-11-06 DIAGNOSIS — R059 Cough, unspecified: Secondary | ICD-10-CM

## 2014-11-06 DIAGNOSIS — J0100 Acute maxillary sinusitis, unspecified: Secondary | ICD-10-CM | POA: Diagnosis not present

## 2014-11-06 MED ORDER — AMOXICILLIN-POT CLAVULANATE 875-125 MG PO TABS: 1.0000 | ORAL_TABLET | Freq: Two times a day (BID) | ORAL | Status: DC

## 2014-11-06 NOTE — Progress Notes (Signed)
Subjective:    Patient ID: Carol Parks, female    DOB: 05/24/1971, 44 y.o.   MRN: [id number]  Chief Complaint  Patient presents with  . Cough  . Sore Throat  . Nasal Congestion   Patient Active Problem List   Diagnosis Date Noted  . First-degree perineal laceration, with delivery 09/04/2013  . SGA (small for gestational age) 09/03/2013  . Hx of preeclampsia, prior pregnancy, currently pregnant 09/03/2013  . Gestational diabetes--diet controlled. 09/03/2013  . Latex allergy 09/03/2013  . Hx of reduction mammoplasty 09/03/2013   Prior to Admission medications   Medication Sig Start Date End Date Taking? Authorizing Provider  amoxicillin-clavulanate (AUGMENTIN) 875-125 MG per tablet Take 1 tablet by mouth 2 (two) times daily. 11/06/14   Araceli Bouche, PA   Medications, allergies, past medical history, surgical history, family history, social history and problem list reviewed and updated.  HPI  84 yof with no pertinent pmh presents with 8-9 day h/o congestion, rhinorrhea, mild non prod cough.  Sx were fairly sudden onset. Sx have all been constant. Cough mostly non prod but occasionally yellow sputum. Has had moderate sore throat past couple days. Denies fever, chills, abd pain, n/v, diarrhea. Has taken dayquil without much relief.   Hx seasonal allergies. Has not taken much for them in several yrs since moving from the [place].   Review of Systems No cp, sob. See HPI.     Objective:   Physical Exam  Constitutional: She appears well-developed and well-nourished.  Non-toxic appearance. She does not have a sickly appearance. She does not appear ill. No distress.  BP 139/83 mmHg  Pulse 89  Temp(Src) 97.9 F (36.6 C) (Oral)  Resp 20  Ht 5' 3.75" (1.619 m)  Wt 264 lb 2 oz (119.806 kg)  BMI 45.71 kg/m2  SpO2 100%  LMP 10/10/2014  Breastfeeding? No   HENT:  Right Ear: Tympanic membrane is not erythematous. A middle ear effusion is present.  Left Ear: Tympanic membrane is not  erythematous. A middle ear effusion is present.  Nose: Mucosal edema and rhinorrhea present. Right sinus exhibits maxillary sinus tenderness and frontal sinus tenderness. Left sinus exhibits maxillary sinus tenderness.  Mouth/Throat: Uvula is midline, oropharynx is clear and moist and mucous membranes are normal. No oropharyngeal exudate, posterior oropharyngeal edema, posterior oropharyngeal erythema or tonsillar abscesses.  Mild frontal ttp. Moderate ttp over maxillary sinuses.   Neck: No Brudzinski's sign noted.  Pulmonary/Chest: Effort normal and breath sounds normal. She has no decreased breath sounds. She has no wheezes. She has no rhonchi. She has no rales.  Lymphadenopathy:       Head (right side): No submental, no submandibular and no tonsillar adenopathy present.       Head (left side): No submental, no submandibular and no tonsillar adenopathy present.    She has no cervical adenopathy.  Psychiatric: She has a normal mood and affect. Her speech is normal.      Assessment & Plan:   24 yof with no pertinent pmh presents with 8-9 day h/o congestion, rhinorrhea, mild non prod cough.  Acute maxillary sinusitis, recurrence not specified - Plan: amoxicillin-clavulanate (AUGMENTIN) 875-125 MG per tablet --9 day h/o congestion, ttp over sinuses --augmentin 10 days --congestion likely initially from allergies --> encouraged daily claritin/flonase during allergy season --pcn allergy was actually just a vaginal yeast infx after pcn many yrs ago, instructed to contact office if she gets yeast infx with augmentin --rtc if not better 4-5 days  Julieta Gutting,  PA-C Physician Assistant-Certified [facility]  11/06/2014 9:09 PM

## 2014-11-06 NOTE — Patient Instructions (Signed)
Please take the augmentin twice daily for 10 days.  If you get a yeast infection please let us know and we'll send in diflucan for you. Please come back to clinic if you're not feeling better in 4-5 days.  I think allergies may be the initial cause of your congestion, flonase and claritin daily during the spring may help.

## 2015-03-04 ENCOUNTER — Ambulatory Visit (INDEPENDENT_AMBULATORY_CARE_PROVIDER_SITE_OTHER): Payer: Commercial Managed Care - HMO | Admitting: Physician Assistant

## 2015-03-04 VITALS — BP 120/72 | HR 89 | Temp 98.7°F | Resp 18 | Ht 63.5 in | Wt 264.0 lb

## 2015-03-04 DIAGNOSIS — M79672 Pain in left foot: Secondary | ICD-10-CM | POA: Diagnosis not present

## 2015-03-04 DIAGNOSIS — S46811A Strain of other muscles, fascia and tendons at shoulder and upper arm level, right arm, initial encounter: Secondary | ICD-10-CM

## 2015-03-04 DIAGNOSIS — M79671 Pain in right foot: Secondary | ICD-10-CM

## 2015-03-04 MED ORDER — CYCLOBENZAPRINE HCL ER 15 MG PO CP24: 15.0000 mg | ORAL_CAPSULE | Freq: Every day | ORAL | Status: DC | PRN

## 2015-03-04 MED ORDER — DICLOFENAC SODIUM 75 MG PO TBEC: 75.0000 mg | DELAYED_RELEASE_TABLET | Freq: Two times a day (BID) | ORAL | Status: DC

## 2015-03-04 NOTE — Progress Notes (Signed)
Carol Parks  MRN: [id number] DOB: April 17, 1971  Subjective:  Pt presents to clinic with right shoulder pain for the last several months but over the last month it has gotten much worse - she is waking up from the pain when she rolls over to that side.  The pain is not in her shoulder joint but rather between her shoulder and her neck.  She is having increased pain with use of the arm.  She has a 60 month old son who is not walking and this definitely makes the pain worse when she carries him or anything else heavy with her right arm.  She is having no radiation of pain into her arm or neck and she is having no paresthesias.  She has done nothing otc to help with her pain.  She is also having bilateral foot bottom pain near her heel when she stands 1st thing in the am and then after she sits for long period of time at work.  She notices that the pain is aching pulling sensation and seems to be better the more she walks. She typically wears tennis shoes or flats and she has custom orthotics that she wears that were made about 13 years ago in her tennis shoes after she was told she has fallen arches.  She has had no injury to her right shoulder or her feet.  Patient Active Problem List   Diagnosis Date Noted  . Latex allergy 09/03/2013  . Hx of reduction mammoplasty 09/03/2013    No current outpatient prescriptions on file prior to visit.   No current facility-administered medications on file prior to visit.    Allergies  Allergen Reactions  . Penicillins Other (See Comments)    Get yeast infection  . Latex Rash    Review of Systems  Constitutional: Negative for fever and chills.  Musculoskeletal: Negative for gait problem, neck pain and neck stiffness.   Objective:  BP 120/72 mmHg  Pulse 89  Temp(Src) 98.7 F (37.1 C) (Oral)  Resp 18  Ht 5' 3.5" (1.613 m)  Wt 264 lb (119.75 kg)  BMI 46.03 kg/m2  SpO2 98%  LMP 02/08/2015  Physical Exam  Constitutional: She is oriented to  person, place, and time and well-developed, well-nourished, and in no distress.  HENT:  Head: Normocephalic and atraumatic.  Right Ear: Hearing and external ear normal.  Left Ear: Hearing and external ear normal.  Eyes: Conjunctivae are normal.  Neck: Normal range of motion.  Pulmonary/Chest: Effort normal.  Musculoskeletal:       Right shoulder: Normal.       Left shoulder: Normal.       Cervical back: She exhibits spasm (right >left trap spasm).       Right foot: There is tenderness.       Left foot: There is tenderness.  bilateral TTP at calcaneous - right arch lower than the left - slight inversion with walking.  Neurological: She is alert and oriented to person, place, and time. Gait normal.  Skin: Skin is warm and dry.  Psychiatric: Mood, memory, affect and judgment normal.  Vitals reviewed.   Assessment and Plan :  Trapezius strain, right, initial encounter -pt is experiencing muscle spasms and we discussed improved posture and stretching exercises - she has never had treatment for her discomfort and she will try at home therapy and then if she is not getting much better she will call me for some formal PT.  She will use heat on the  trapezius bilaterally which should help with her healing. Plan: cyclobenzaprine (AMRIX) 15 MG 24 hr capsule, diclofenac (VOLTAREN) 75 MG EC tablet  Foot pain, bilateral - I suspect that the patient has plantar fascitis - she is overweight and her arches are flattening as she walks.  She will try heel cups and frozen water bottle massage - if she is no better we can also send her to PT for this also.  She will let me know.  Plan: diclofenac (VOLTAREN) 75 MG EC tablet  Windell Hummingbird PA-C  [facility] 03/04/2015 6:46 PM

## 2015-03-04 NOTE — Patient Instructions (Signed)
Plantar Fasciitis  Plantar fasciitis is a common condition that causes foot pain. It is soreness (inflammation) of the band of tough fibrous tissue on the bottom of the foot that runs from the heel bone (calcaneus) to the ball of the foot. The cause of this soreness may be from excessive standing, poor fitting shoes, running on hard surfaces, being overweight, having an abnormal walk, or overuse (this is common in runners) of the painful foot or feet. It is also common in aerobic exercise dancers and ballet dancers.  SYMPTOMS   Most people with plantar fasciitis complain of:   Severe pain in the morning on the bottom of their foot especially when taking the first steps out of bed. This pain recedes after a few minutes of walking.   Severe pain is experienced also during walking following a long period of inactivity.   Pain is worse when walking barefoot or up stairs  DIAGNOSIS    Your caregiver will diagnose this condition by examining and feeling your foot.   Special tests such as X-rays of your foot, are usually not needed.  PREVENTION    Consult a sports medicine professional before beginning a new exercise program.   Walking programs offer a good workout. With walking there is a lower chance of overuse injuries common to runners. There is less impact and less jarring of the joints.   Begin all new exercise programs slowly. If problems or pain develop, decrease the amount of time or distance until you are at a comfortable level.   Wear good shoes and replace them regularly.   Stretch your foot and the heel cords at the back of the ankle (Achilles tendon) both before and after exercise.   Run or exercise on even surfaces that are not hard. For example, asphalt is better than pavement.   Do not run barefoot on hard surfaces.   If using a treadmill, vary the incline.   Do not continue to workout if you have foot or joint problems. Seek professional help if they do not improve.  HOME CARE INSTRUCTIONS     Avoid activities that cause you pain until you recover.   Use ice or cold packs on the problem or painful areas after working out.   Only take over-the-counter or prescription medicines for pain, discomfort, or fever as directed by your caregiver.   Soft shoe inserts or athletic shoes with air or gel sole cushions may be helpful.   If problems continue or become more severe, consult a sports medicine caregiver or your own health care provider. Cortisone is a potent anti-inflammatory medication that may be injected into the painful area. You can discuss this treatment with your caregiver.  MAKE SURE YOU:    Understand these instructions.   Will watch your condition.   Will get help right away if you are not doing well or get worse.  Document Released: 05/17/2001 Document Revised: 11/14/2011 Document Reviewed: 07/16/2008  ExitCare Patient Information 2015 [employer]. This information is not intended to replace advice given to you by your health care provider. Make sure you discuss any questions you have with your health care provider.

## 2015-06-16 ENCOUNTER — Encounter: Payer: Commercial Managed Care - HMO | Admitting: Family Medicine

## 2015-06-23 ENCOUNTER — Telehealth: Payer: Self-pay | Admitting: Family Medicine

## 2015-06-23 NOTE — Telephone Encounter (Signed)
lmom of patient new time on 07/21/15 at 2:45 letter sent also

## 2015-07-06 ENCOUNTER — Encounter: Payer: Commercial Managed Care - HMO | Admitting: Family Medicine

## 2015-07-21 ENCOUNTER — Ambulatory Visit (INDEPENDENT_AMBULATORY_CARE_PROVIDER_SITE_OTHER): Payer: Commercial Managed Care - HMO | Admitting: Family Medicine

## 2015-07-21 ENCOUNTER — Encounter: Payer: Self-pay | Admitting: Family Medicine

## 2015-07-21 DIAGNOSIS — Z0000 Encounter for general adult medical examination without abnormal findings: Secondary | ICD-10-CM

## 2015-07-21 DIAGNOSIS — Z139 Encounter for screening, unspecified: Secondary | ICD-10-CM

## 2015-07-21 DIAGNOSIS — Z130 Encounter for screening for diseases of the blood and blood-forming organs and certain disorders involving the immune mechanism: Secondary | ICD-10-CM

## 2015-07-21 DIAGNOSIS — J301 Allergic rhinitis due to pollen: Secondary | ICD-10-CM | POA: Diagnosis not present

## 2015-07-21 DIAGNOSIS — Z1389 Encounter for screening for other disorder: Secondary | ICD-10-CM

## 2015-07-21 LAB — POCT URINALYSIS DIP (MANUAL ENTRY)
BILIRUBIN UA: NEGATIVE
BILIRUBIN UA: NEGATIVE
GLUCOSE UA: NEGATIVE
Leukocytes, UA: NEGATIVE
Nitrite, UA: NEGATIVE
PH UA: 5.5
Protein Ur, POC: NEGATIVE
Spec Grav, UA: >=1.03
Urobilinogen, UA: 0.2

## 2015-07-21 LAB — COMPREHENSIVE METABOLIC PANEL
ALBUMIN: 3.6 g/dL (ref 3.6–5.1)
ALT: 13 U/L (ref 6–29)
AST: 14 U/L (ref 10–30)
Alkaline Phosphatase: 77 U/L (ref 33–115)
BUN: 13 mg/dL (ref 7–25)
CHLORIDE: 105 mmol/L (ref 98–110)
CO2: 25 mmol/L (ref 20–31)
Calcium: 8.6 mg/dL (ref 8.6–10.2)
Creat: 0.62 mg/dL (ref 0.50–1.10)
Glucose, Bld: 97 mg/dL (ref 65–99)
Potassium: 4 mmol/L (ref 3.5–5.3)
Sodium: 138 mmol/L (ref 135–146)
TOTAL PROTEIN: 6.4 g/dL (ref 6.1–8.1)
Total Bilirubin: 0.4 mg/dL (ref 0.2–1.2)

## 2015-07-21 LAB — LIPID PANEL
CHOL/HDL RATIO: 4.7 ratio (ref <=5.0)
CHOLESTEROL: 184 mg/dL (ref 125–200)
HDL: 39 mg/dL — ABNORMAL LOW (ref >=46)
LDL Cholesterol: 104 mg/dL (ref <130)
Triglycerides: 205 mg/dL — ABNORMAL HIGH (ref <150)
VLDL: 41 mg/dL — AB (ref <30)

## 2015-07-21 LAB — CBC
HCT: 39.1 % (ref 36.0–46.0)
HEMOGLOBIN: 13.1 g/dL (ref 12.0–15.0)
MCH: 30.6 pg (ref 26.0–34.0)
MCHC: 33.5 g/dL (ref 30.0–36.0)
MCV: 91.4 fL (ref 78.0–100.0)
MPV: 9.2 fL (ref 8.6–12.4)
Platelets: 377 10*3/uL (ref 150–400)
RBC: 4.28 MIL/uL (ref 3.87–5.11)
RDW: 13.9 % (ref 11.5–15.5)
WBC: 7.6 10*3/uL (ref 4.0–10.5)

## 2015-07-21 LAB — HEMOGLOBIN A1C
HEMOGLOBIN A1C: 5.4 % (ref <5.7)
MEAN PLASMA GLUCOSE: 108 mg/dL (ref <117)

## 2015-07-21 LAB — TSH: TSH: 1.098 u[IU]/mL (ref 0.350–4.500)

## 2015-07-21 MED ORDER — FLUTICASONE PROPIONATE 50 MCG/ACT NA SUSP: 2.0000 | Freq: Every day | NASAL | Status: DC

## 2015-07-21 NOTE — Patient Instructions (Addendum)
Wart stick and duct tape for plantar wart Plan your meals and snacks Keep junk food out of house    Why follow it? Research shows. . Those who follow the Mediterranean diet have a reduced risk of heart disease  . The diet is associated with a reduced incidence of Parkinson's and Alzheimer's diseases . People following the diet may have longer life expectancies and lower rates of chronic diseases  . The Dietary Guidelines for Americans recommends the Mediterranean diet as an eating plan to promote health and prevent disease  What Is the Mediterranean Diet?  . Healthy eating plan based on typical foods and recipes of Mediterranean-style cooking . The diet is primarily a plant based diet; these foods should make up a majority of meals   Starches - Plant based foods should make up a majority of meals - They are an important sources of vitamins, minerals, energy, antioxidants, and fiber - Choose whole grains, foods high in fiber and minimally processed items  - Typical grain sources include wheat, oats, barley, corn, brown rice, bulgar, farro, millet, polenta, couscous  - Various types of beans include chickpeas, lentils, fava beans, black beans, white beans   Fruits  Veggies - Large quantities of antioxidant rich fruits & veggies; 6 or more servings  - Vegetables can be eaten raw or lightly drizzled with oil and cooked  - Vegetables common to the traditional Mediterranean Diet include: artichokes, arugula, beets, broccoli, brussel sprouts, cabbage, carrots, celery, collard greens, cucumbers, eggplant, kale, leeks, lemons, lettuce, mushrooms, okra, onions, peas, peppers, potatoes, pumpkin, radishes, rutabaga, shallots, spinach, sweet potatoes, turnips, zucchini - Fruits common to the Mediterranean Diet include: apples, apricots, avocados, cherries, clementines, dates, figs, grapefruits, grapes, melons, nectarines, oranges, peaches, pears, pomegranates, strawberries, tangerines  Fats - Replace butter  and margarine with healthy oils, such as olive oil, canola oil, and tahini  - Limit nuts to no more than a handful a day  - Nuts include walnuts, almonds, pecans, pistachios, pine nuts  - Limit or avoid candied, honey roasted or heavily salted nuts - Olives are central to the [employer] - can be eaten whole or used in a variety of dishes   Meats Protein - Limiting red meat: no more than a few times a month - When eating red meat: choose lean cuts and keep the portion to the size of deck of cards - Eggs: approx. 0 to 4 times a week  - Fish and lean poultry: at least 2 a week  - Healthy protein sources include, chicken, [place], lean beef, lamb - Increase intake of seafood such as tuna, salmon, trout, mackerel, shrimp, scallops - Avoid or limit high fat processed meats such as sausage and bacon  Dairy - Include moderate amounts of low fat dairy products  - Focus on healthy dairy such as fat free yogurt, skim milk, low or reduced fat cheese - Limit dairy products higher in fat such as whole or 2% milk, cheese, ice cream  Alcohol - Moderate amounts of red wine is ok  - No more than 5 oz daily for women (all ages) and men older than age 44  - No more than 10 oz of wine daily for men younger than 73  Other - Limit sweets and other desserts  - Use herbs and spices instead of salt to flavor foods  - Herbs and spices common to the traditional Mediterranean Diet include: basil, bay leaves, chives, cloves, cumin, fennel, garlic, lavender, marjoram, mint, oregano, parsley, pepper,  rosemary, sage, savory, sumac, tarragon, thyme   It's not just a diet, it's a lifestyle:  . The Mediterranean diet includes lifestyle factors typical of those in the region  . Foods, drinks and meals are best eaten with others and savored . Daily physical activity is important for overall good health . This could be strenuous exercise like running and aerobics . This could also be more leisurely activities such as  walking, housework, yard-work, or taking the stairs . Moderation is the key; a balanced and healthy diet accommodates most foods and drinks . Consider portion sizes and frequency of consumption of certain foods   Meal Ideas & Options:  . Breakfast:  o Whole wheat toast or whole wheat English muffins with peanut butter & hard boiled egg o Steel cut oats topped with apples & cinnamon and skim milk  o Fresh fruit: banana, strawberries, melon, berries, peaches  o Smoothies: strawberries, bananas, greek yogurt, peanut butter o Low fat greek yogurt with blueberries and granola  o Egg white omelet with spinach and mushrooms o Breakfast couscous: whole wheat couscous, apricots, skim milk, cranberries  . Sandwiches:  o Hummus and grilled vegetables (peppers, zucchini, squash) on whole wheat bread   o Grilled chicken on whole wheat pita with lettuce, tomatoes, cucumbers or tzatziki  o Tuna salad on whole wheat bread: tuna salad made with greek yogurt, olives, red peppers, capers, green onions o Garlic rosemary lamb pita: lamb sauted with garlic, rosemary, salt & pepper; add lettuce, cucumber, greek yogurt to pita - flavor with lemon juice and black pepper  . Seafood:  o Mediterranean grilled salmon, seasoned with garlic, basil, parsley, lemon juice and black pepper o Shrimp, lemon, and spinach whole-grain pasta salad made with low fat greek yogurt  o Seared scallops with lemon orzo  o Seared tuna steaks seasoned salt, pepper, coriander topped with tomato mixture of olives, tomatoes, olive oil, minced garlic, parsley, green onions and cappers  . Meats:  o Herbed greek chicken salad with kalamata olives, cucumber, feta  o Red bell peppers stuffed with spinach, bulgur, lean ground beef (or lentils) & topped with feta   o Kebabs: skewers of chicken, tomatoes, onions, zucchini, squash  o [place] burgers: made with red onions, mint, dill, lemon juice, feta cheese topped with roasted red  peppers . Vegetarian o Cucumber salad: cucumbers, artichoke hearts, celery, red onion, feta cheese, tossed in olive oil & lemon juice  o Hummus and whole grain pita points with a greek salad (lettuce, tomato, feta, olives, cucumbers, red onion) o Lentil soup with celery, carrots made with vegetable broth, garlic, salt and pepper  o Tabouli salad: parsley, bulgur, mint, scallions, cucumbers, tomato, radishes, lemon juice, olive oil, salt and pepper.

## 2015-07-21 NOTE — Progress Notes (Signed)
Subjective:    Patient ID: Carol Parks, female    DOB: 14-Oct-1970, 44 y.o.   MRN: [id number]  HPI This is a pleasant 44 yo female who presents today for CPE. She is married and has 3 children. She works at the [employer] center. 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 She likes the regular hours and no weekends and holidays. She misses the dispatch component and camraderie of her previous work group. She spends a lot of time sitting in her office and hasn't felt included with her new group.   Last CPE- last year Mammo- 7/15 Pap- 2014 Tdap- 10/14 Flu- declines Eye- regular Dental- regular Exercise- none. Exercised at work when she worked night shift. None now.   Discussed typical food intake with patient. She and her husband share grocery shopping. He does most of the cooking. She eats a lot of processed, high carbohydrate foods (donuts, desserts, breads). Doesn't always plan breakfast and lunch.   Past Medical History  Diagnosis Date  . Allergy   . Medical history non-contributory    Past Surgical History  Procedure Laterality Date  . Breast surgery    . Breast reduction surgery Bilateral   . Tubal ligation Bilateral 09/05/2013    Procedure: POST PARTUM TUBAL LIGATION;  Surgeon: Alwyn Pea, MD;  Location: [facility];  Service: Gynecology;  Laterality: Bilateral;   Family History  Problem Relation Age of Onset  . Hypertension Mother   . Cancer Mother   . Heart attack Father   . Aneurysm Father   . Heart attack Maternal Grandmother   . Heart attack Paternal Grandmother   . Heart attack Paternal Grandfather   . Asthma Daughter    Social History  Substance Use Topics  . Smoking status: Never Smoker   . Smokeless tobacco: Never Used  . Alcohol Use: No   Review of Systems  Constitutional: Negative.   HENT: Negative.   Eyes: Negative.   Cardiovascular: Negative.   Gastrointestinal: Negative.   Endocrine: Negative.     Genitourinary: Negative.   Musculoskeletal: Positive for back pain (intermittent, low back).  Skin: Negative.   Allergic/Immunologic: Positive for environmental allergies.  Neurological: Negative.   Hematological: Negative.   Psychiatric/Behavioral: Negative.       Objective:   Physical Exam Physical Exam  Constitutional: Obese. She is oriented to person, place, and time. She appears well-developed and well-nourished. No distress.  HENT:  Head: Normocephalic and atraumatic.  Right Ear: External ear normal.  Left Ear: External ear normal.  Nose: Nose normal.  Mouth/Throat: Oropharynx is clear and moist. No oropharyngeal exudate.  Eyes: Conjunctivae are normal. Pupils are equal, round, and reactive to light.  Neck: Normal range of motion. Neck supple. No JVD present. No thyromegaly present.  Cardiovascular: Normal rate, regular rhythm, normal heart sounds and intact distal pulses.   Pulmonary/Chest: Effort normal and breath sounds normal. Right breast exhibits no inverted nipple, no mass, no nipple discharge, no skin change and no tenderness. Left breast exhibits no inverted nipple, no mass, no nipple discharge, no skin change and no tenderness. Breasts are symmetrical.  Abdominal: Soft. Bowel sounds are normal. She exhibits no distension and no mass. There is no tenderness. There is no rebound and no guarding.  Genitourinary: Vagina normal. Pelvic exam was performed with patient supine. There is no rash, tenderness, lesion or injury on the right labia. There is no rash, tenderness, lesion or injury on the left  labia. Cervix exhibits no motion tenderness and no discharge. No vaginal discharge found.  Musculoskeletal: Normal range of motion. She exhibits no edema or tenderness.  Lymphadenopathy:    She has no cervical adenopathy.  Neurological: She is alert and oriented to person, place, and time. She has normal reflexes.  Skin: Skin is warm and dry. She is not diaphoretic.  Psychiatric:  She has a normal mood and affect. Her behavior is normal. Judgment and thought content normal.  Vitals reviewed.  BP 143/94 mmHg  Pulse 108  Temp(Src) 97.9 F (36.6 C) (Oral)  Resp 16  Ht 5' 4.5" (1.638 m)  Wt 267 lb 12.8 oz (121.473 kg)  BMI 45.27 kg/m2  LMP 07/09/2015     Assessment & Plan:  1. Annual physical exam - encouraged healthy habits- regular, adequate sleep, regular healthy meals, exercise  2. Allergic rhinitis due to pollen - fluticasone (FLONASE) 50 MCG/ACT nasal spray; Place 2 sprays into both nostrils daily.  Dispense: 16 g; Refill: 3  3. Morbid obesity due to excess calories ([place]) - discussed factors impeding weight loss- schedule, stress, lack of planning. Patient deflecting during discussion. Not sure if she is motivated to make changes. Discussed various eating plans, support structures. Encouraged her to see resources through her employer/insurance company as finances are an issue.  - Lipid panel - TSH - Comprehensive metabolic panel - Hemoglobin A1c  4. Screening for hematuria or proteinuria - POCT urinalysis dipstick  5. Screening for deficiency anemia - CBC  6. Screening for nephropathy - Comprehensive metabolic panel   Clarene Reamer, FNP-BC  [facility], [facility]  07/24/2015 8:25 AM

## 2015-07-24 ENCOUNTER — Encounter: Payer: Self-pay | Admitting: Family Medicine

## 2016-02-09 ENCOUNTER — Ambulatory Visit (INDEPENDENT_AMBULATORY_CARE_PROVIDER_SITE_OTHER): Payer: Commercial Managed Care - HMO | Admitting: Physician Assistant

## 2016-02-09 VITALS — BP 126/82 | HR 94 | Temp 98.2°F | Resp 18 | Ht 64.5 in | Wt 255.0 lb

## 2016-02-09 DIAGNOSIS — R1030 Lower abdominal pain, unspecified: Secondary | ICD-10-CM | POA: Diagnosis not present

## 2016-02-09 DIAGNOSIS — R197 Diarrhea, unspecified: Secondary | ICD-10-CM

## 2016-02-09 DIAGNOSIS — R110 Nausea: Secondary | ICD-10-CM

## 2016-02-09 LAB — POCT URINALYSIS DIP (MANUAL ENTRY)
Bilirubin, UA: NEGATIVE
Blood, UA: NEGATIVE
GLUCOSE UA: NEGATIVE
Ketones, POC UA: NEGATIVE
Leukocytes, UA: NEGATIVE
NITRITE UA: NEGATIVE
Spec Grav, UA: 1.025
Urobilinogen, UA: 0.2
pH, UA: 5.5

## 2016-02-09 LAB — POCT WET + KOH PREP
TRICH BY WET PREP: ABSENT
YEAST BY WET PREP: ABSENT
Yeast by KOH: ABSENT

## 2016-02-09 LAB — POCT CBC
Granulocyte percent: 66.7 %G (ref 37–80)
HEMATOCRIT: 40.2 % (ref 37.7–47.9)
HEMOGLOBIN: 13.9 g/dL (ref 12.2–16.2)
LYMPH, POC: 2.5 (ref 0.6–3.4)
MCH, POC: 30.7 pg (ref 27–31.2)
MCHC: 34.5 g/dL (ref 31.8–35.4)
MCV: 89 fL (ref 80–97)
MID (cbc): 0.8 (ref 0–0.9)
MPV: 7.3 fL (ref 0–99.8)
POC GRANULOCYTE: 6.5 (ref 2–6.9)
POC LYMPH %: 25 % (ref 10–50)
POC MID %: 8.3 % (ref 0–12)
Platelet Count, POC: 286 10*3/uL (ref 142–424)
RBC: 4.51 M/uL (ref 4.04–5.48)
RDW, POC: 14.4 %
WBC: 9.8 10*3/uL (ref 4.6–10.2)

## 2016-02-09 LAB — COMPLETE METABOLIC PANEL WITH GFR
ALBUMIN: 3.8 g/dL (ref 3.6–5.1)
ALK PHOS: 85 U/L (ref 33–115)
ALT: 12 U/L (ref 6–29)
AST: 11 U/L (ref 10–30)
BILIRUBIN TOTAL: 0.6 mg/dL (ref 0.2–1.2)
BUN: 12 mg/dL (ref 7–25)
CO2: 19 mmol/L — AB (ref 20–31)
CREATININE: 0.76 mg/dL (ref 0.50–1.10)
Calcium: 8.4 mg/dL — ABNORMAL LOW (ref 8.6–10.2)
Chloride: 106 mmol/L (ref 98–110)
GFR, Est African American: >89 mL/min (ref >=60)
GLUCOSE: 95 mg/dL (ref 65–99)
Potassium: 4.3 mmol/L (ref 3.5–5.3)
SODIUM: 138 mmol/L (ref 135–146)
Total Protein: 6.6 g/dL (ref 6.1–8.1)

## 2016-02-09 LAB — POC MICROSCOPIC URINALYSIS (UMFC)

## 2016-02-09 LAB — TSH: TSH: 1.3 mIU/L

## 2016-02-09 LAB — POCT URINE PREGNANCY: PREG TEST UR: NEGATIVE

## 2016-02-09 LAB — GLUCOSE, POCT (MANUAL RESULT ENTRY): POC Glucose: 92 mg/dl (ref 70–99)

## 2016-02-09 LAB — LIPASE: Lipase: 16 U/L (ref 7–60)

## 2016-02-09 MED ORDER — ONDANSETRON 8 MG PO TBDP: 8.0000 mg | ORAL_TABLET | Freq: Three times a day (TID) | ORAL | Status: DC | PRN

## 2016-02-09 NOTE — Progress Notes (Signed)
[facility] [address], [place] [address] [phone]  Date:  02/09/2016   Name:  Carol Parks   DOB:  1971/04/13   MRN:  [id number]  PCP:  No primary care provider on file.   Chief Complaint  Patient presents with  . Abdominal Pain    1 day    History of Present Illness:  Carol Parks is a 45 y.o. female patient who presents to [facility] for cc of abdominal pain.  Lower abdominal pain intermittent she gets up, and has pain--this is achey--she will have diarrhea.  She will have it once per month where she will have throw up.  Not urinating more than she normally does.   2-3 days prior to your period starting.  She has never had any difficulties with irregular menses.  She has no personal or family hx of ulcerative colitis, or crohn's disease.  Only hx of lower gi bleed due to hemorrhoids.     She is currently on a diet plan--real appeal.   Patient Active Problem List   Diagnosis Date Noted  . Latex allergy 09/03/2013  . Hx of reduction mammoplasty 09/03/2013    Past Medical History  Diagnosis Date  . Allergy   . Medical history non-contributory     Past Surgical History  Procedure Laterality Date  . Breast surgery    . Breast reduction surgery Bilateral   . Tubal ligation Bilateral 09/05/2013    Procedure: POST PARTUM TUBAL LIGATION;  Surgeon: Alwyn Pea, MD;  Location: [facility];  Service: Gynecology;  Laterality: Bilateral;    Social History  Substance Use Topics  . Smoking status: Never Smoker   . Smokeless tobacco: Never Used  . Alcohol Use: No    Family History  Problem Relation Age of Onset  . Hypertension Mother   . Cancer Mother   . Heart attack Father   . Aneurysm Father   . Heart attack Maternal Grandmother   . Heart attack Paternal Grandmother   . Heart attack Paternal Grandfather   . Asthma Daughter     Allergies  Allergen Reactions  . Penicillins Other (See Comments)    Get yeast infection  . Latex Rash    Medication  list has been reviewed and updated.  Current Outpatient Prescriptions on File Prior to Visit  Medication Sig Dispense Refill  . fluticasone (FLONASE) 50 MCG/ACT nasal spray Place 2 sprays into both nostrils daily. 16 g 3  . loratadine (CLARITIN) 10 MG tablet Take 10 mg by mouth daily.     No current facility-administered medications on file prior to visit.    ROS ROS otherwise unremarkable unless listed above.   Physical Examination: BP 126/82 mmHg  Pulse 94  Temp(Src) 98.2 F (36.8 C) (Oral)  Resp 18  Ht 5' 4.5" (1.638 m)  Wt 255 lb (115.667 kg)  BMI 43.11 kg/m2  SpO2 98%  LMP 01/12/2016 Ideal Body Weight: Weight in (lb) to have BMI = 25: 147.6  Physical Exam  Constitutional: She is oriented to person, place, and time. She appears well-developed and well-nourished. No distress.  HENT:  Head: Normocephalic and atraumatic.  Right Ear: External ear normal.  Left Ear: External ear normal.  Eyes: Conjunctivae and EOM are normal. Pupils are equal, round, and reactive to light.  Cardiovascular: Normal rate.   Pulmonary/Chest: Effort normal. No respiratory distress.  Abdominal: Soft. Normal appearance and bowel sounds are normal. There is tenderness in the suprapubic area. There is no  tenderness at McBurney's point and negative Murphy's sign.  Genitourinary: Pelvic exam was performed with patient supine. There is no rash on the right labia. There is no rash on the left labia. Uterus is tender. Cervix exhibits no motion tenderness, no discharge and no friability. Right adnexum displays no mass, no tenderness and no fullness. Left adnexum displays no mass, no tenderness and no fullness.  Neurological: She is alert and oriented to person, place, and time.  Skin: She is not diaphoretic.  Psychiatric: She has a normal mood and affect. Her behavior is normal.    Results for orders placed or performed in visit on 02/09/16  COMPLETE METABOLIC PANEL WITH GFR  Result Value Ref Range    Sodium 138 135 - 146 mmol/L   Potassium 4.3 3.5 - 5.3 mmol/L   Chloride 106 98 - 110 mmol/L   CO2 19 (L) 20 - 31 mmol/L   Glucose, Bld 95 65 - 99 mg/dL   BUN 12 7 - 25 mg/dL   Creat 0.76 0.50 - 1.10 mg/dL   Total Bilirubin 0.6 0.2 - 1.2 mg/dL   Alkaline Phosphatase 85 33 - 115 U/L   AST 11 10 - 30 U/L   ALT 12 6 - 29 U/L   Total Protein 6.6 6.1 - 8.1 g/dL   Albumin 3.8 3.6 - 5.1 g/dL   Calcium 8.4 (L) 8.6 - 10.2 mg/dL   GFR, Est African American >89 >=60 mL/min   GFR, Est Non African American >89 >=60 mL/min  TSH  Result Value Ref Range   TSH 1.30 mIU/L  Lipase  Result Value Ref Range   Lipase 16 7 - 60 U/L  POCT CBC  Result Value Ref Range   WBC 9.8 4.6 - 10.2 K/uL   Lymph, poc 2.5 0.6 - 3.4   POC LYMPH PERCENT 25.0 10 - 50 %L   MID (cbc) 0.8 0 - 0.9   POC MID % 8.3 0 - 12 %M   POC Granulocyte 6.5 2 - 6.9   Granulocyte percent 66.7 37 - 80 %G   RBC 4.51 4.04 - 5.48 M/uL   Hemoglobin 13.9 12.2 - 16.2 g/dL   HCT, POC 40.2 37.7 - 47.9 %   MCV 89.0 80 - 97 fL   MCH, POC 30.7 27 - 31.2 pg   MCHC 34.5 31.8 - 35.4 g/dL   RDW, POC 14.4 %   Platelet Count, POC 286 142 - 424 K/uL   MPV 7.3 0 - 99.8 fL  POCT urinalysis dipstick  Result Value Ref Range   Color, UA yellow yellow   Clarity, UA clear clear   Glucose, UA negative negative   Bilirubin, UA negative negative   Ketones, POC UA negative negative   Spec Grav, UA 1.025    Blood, UA negative negative   pH, UA 5.5    Protein Ur, POC =30 (A) negative   Urobilinogen, UA 0.2    Nitrite, UA Negative Negative   Leukocytes, UA Negative Negative  POCT urine pregnancy  Result Value Ref Range   Preg Test, Ur Negative Negative  POCT Microscopic Urinalysis ([facility])  Result Value Ref Range   WBC,UR,HPF,POC Few (A) None WBC/hpf   RBC,UR,HPF,POC None None RBC/hpf   Bacteria Moderate (A) None, Too numerous to count   Mucus Present (A) Absent   Epithelial Cells, UR Per Microscopy Many (A) None, Too numerous to count cells/hpf   POCT glucose (manual entry)  Result Value Ref Range   POC Glucose 92  70 - 99 mg/dl  POCT Wet + KOH Prep  Result Value Ref Range   Yeast by KOH Absent Present, Absent   Yeast by wet prep Absent Present, Absent   WBC by wet prep Moderate (A) None, Few, Too numerous to count   Clue Cells Wet Prep HPF POC None None, Too numerous to count   Trich by wet prep Absent Present, Absent   Bacteria Wet Prep HPF POC Many (A) None, Few, Too numerous to count   Epithelial Cells By [employer] ([facility]) Many (A) None, Few, Too numerous to count   RBC,UR,HPF,POC None None RBC/hpf     Assessment and Plan: Carol Parks is a 45 y.o. female who is here today for cyclical abdominal pain, diarrhea, and nausea. I have ordered a transvaginal and pelvic at this time.  I have advised that we do zofran at this time for nausea.    Lower abdominal pain - Plan: POCT CBC, COMPLETE METABOLIC PANEL WITH GFR, POCT urinalysis dipstick, POCT urine pregnancy, POCT Microscopic Urinalysis ([facility]), TSH, EKG 12-Lead, POCT glucose (manual entry), Lipase, POCT Wet + KOH Prep, US Pelvis Complete, US Transvaginal Non-OB, Pap IG, CT/NG w/ reflex HPV when ASC-U, ondansetron (ZOFRAN-ODT) 8 MG disintegrating tablet, CANCELED: Pap IG w/ reflex to HPV when ASC-U  Ivar Drape, PA-C [facility] 02/09/2016 8:36 AM

## 2016-02-09 NOTE — Patient Instructions (Addendum)
     IF you received an x-ray today, you will receive an invoice from [facility]. Please contact [facility] at [phone] with questions or concerns regarding your invoice.   IF you received labwork today, you will receive an invoice from [employer]. Please contact [facility] at [phone] with questions or concerns regarding your invoice.   Our billing staff will not be able to assist you with questions regarding bills from these companies.  You will be contacted with the lab results as soon as they are available. The fastest way to get your results is to activate your My Chart account. Instructions are located on the last page of this paperwork. If you have not heard from [place] regarding the results in 2 weeks, please contact this office.    We recommend that you schedule a mammogram for breast cancer screening. Typically, you do not need a referral to do this. Please contact a local imaging center to schedule your mammogram.  [facility] - [phone]  *ask for the Radiology [place] ([place]) - [phone] or [phone]  [facility] - [phone] [place] [phone] [facility] - [phone]  *ask for the [facility] - [phone]  *ask for the Radiology Department [facility] - [phone]  *ask for the Mammography Department [facility] - [phone]  I am placing an ultrasound to make sure this has nothing to do with your gallbladder, pelvic, etc., with your symptoms.   Take medication as prescribed.

## 2016-02-11 LAB — PAP IG, CT-NG, RFX HPV ASCU
Chlamydia Probe Amp: NOT DETECTED
GC Probe Amp: NOT DETECTED

## 2016-02-22 ENCOUNTER — Other Ambulatory Visit: Payer: Medicaid Other

## 2016-02-23 ENCOUNTER — Ambulatory Visit
Admission: RE | Admit: 2016-02-23 | Discharge: 2016-02-23 | Disposition: A | Discharge status: Home / Self Care | Payer: Commercial Managed Care - HMO | Source: Ambulatory Visit | Attending: Physician Assistant | Admitting: Physician Assistant

## 2016-06-13 ENCOUNTER — Other Ambulatory Visit: Payer: Self-pay | Admitting: Family Medicine

## 2016-06-13 DIAGNOSIS — J301 Allergic rhinitis due to pollen: Secondary | ICD-10-CM

## 2016-06-29 ENCOUNTER — Other Ambulatory Visit: Payer: Self-pay

## 2016-06-29 MED ORDER — FLUTICASONE PROPIONATE 50 MCG/ACT NA SUSP: 2.0000 | Freq: Every day | NASAL | 0 refills | Status: AC

## 2016-06-29 NOTE — Telephone Encounter (Signed)
Pt called and asked for 90 day RFs of flonase. I explained to pt that I can send in 1 mos, but then she would be due for f/up visit since it has been a year since she has been seen. Pt voiced understanding and will either RTC or buy OTC. Sent the RF.

## 2016-07-01 ENCOUNTER — Encounter: Payer: Self-pay | Admitting: Family Medicine

## 2016-07-25 ENCOUNTER — Ambulatory Visit (INDEPENDENT_AMBULATORY_CARE_PROVIDER_SITE_OTHER): Payer: Commercial Managed Care - HMO | Admitting: Family Medicine

## 2016-07-25 VITALS — BP 130/80 | HR 88 | Temp 98.6°F | Resp 17 | Ht 68.0 in | Wt 259.0 lb

## 2016-07-25 DIAGNOSIS — R109 Unspecified abdominal pain: Secondary | ICD-10-CM

## 2016-07-25 DIAGNOSIS — Z6839 Body mass index (BMI) 39.0-39.9, adult: Secondary | ICD-10-CM

## 2016-07-25 DIAGNOSIS — E6609 Other obesity due to excess calories: Secondary | ICD-10-CM | POA: Diagnosis not present

## 2016-07-25 DIAGNOSIS — K219 Gastro-esophageal reflux disease without esophagitis: Secondary | ICD-10-CM

## 2016-07-25 LAB — COMPREHENSIVE METABOLIC PANEL
ALT: 9 U/L (ref 6–29)
AST: 11 U/L (ref 10–30)
Albumin: 3.7 g/dL (ref 3.6–5.1)
Alkaline Phosphatase: 69 U/L (ref 33–115)
BUN: 11 mg/dL (ref 7–25)
CHLORIDE: 105 mmol/L (ref 98–110)
CO2: 26 mmol/L (ref 20–31)
CREATININE: 0.63 mg/dL (ref 0.50–1.10)
Calcium: 8.7 mg/dL (ref 8.6–10.2)
GLUCOSE: 88 mg/dL (ref 65–99)
POTASSIUM: 4.1 mmol/L (ref 3.5–5.3)
SODIUM: 138 mmol/L (ref 135–146)
Total Bilirubin: 0.3 mg/dL (ref 0.2–1.2)
Total Protein: 6.5 g/dL (ref 6.1–8.1)

## 2016-07-25 LAB — POCT URINALYSIS DIP (MANUAL ENTRY)
BILIRUBIN UA: NEGATIVE
Glucose, UA: NEGATIVE
LEUKOCYTES UA: NEGATIVE
NITRITE UA: NEGATIVE
PH UA: 6.5
PROTEIN UA: NEGATIVE
Spec Grav, UA: 1.02
Urobilinogen, UA: 0.2

## 2016-07-25 LAB — CBC
HCT: 40.9 % (ref 35.0–45.0)
Hemoglobin: 13.5 g/dL (ref 11.7–15.5)
MCH: 29.5 pg (ref 27.0–33.0)
MCHC: 33 g/dL (ref 32.0–36.0)
MCV: 89.5 fL (ref 80.0–100.0)
MPV: 9.5 fL (ref 7.5–12.5)
PLATELETS: 323 10*3/uL (ref 140–400)
RBC: 4.57 MIL/uL (ref 3.80–5.10)
RDW: 14.7 % (ref 11.0–15.0)
WBC: 9.1 10*3/uL (ref 3.8–10.8)

## 2016-07-25 LAB — POC MICROSCOPIC URINALYSIS (UMFC): MUCUS RE: ABSENT

## 2016-07-25 MED ORDER — OMEPRAZOLE 20 MG PO TBEC: 1.0000 | DELAYED_RELEASE_TABLET | Freq: Every day | ORAL | 1 refills | Status: DC

## 2016-07-25 NOTE — Patient Instructions (Addendum)
IF you received an x-ray today, you will receive an invoice from [facility]. Please contact [facility] at [phone] with questions or concerns regarding your invoice.   IF you received labwork today, you will receive an invoice from [employer]. Please contact [facility] at [phone] with questions or concerns regarding your invoice.   Our billing staff will not be able to assist you with questions regarding bills from these companies.  You will be contacted with the lab results as soon as they are available. The fastest way to get your results is to activate your My Chart account. Instructions are located on the last page of this paperwork. If you have not heard from [place] regarding the results in 2 weeks, please contact this office.      Gastroesophageal Reflux Disease, Adult Normally, food travels down the esophagus and stays in the stomach to be digested. However, when a person has gastroesophageal reflux disease (GERD), food and stomach acid move back up into the esophagus. When this happens, the esophagus becomes sore and inflamed. Over time, GERD can create small holes (ulcers) in the lining of the esophagus. What are the causes? This condition is caused by a problem with the muscle between the esophagus and the stomach (lower esophageal sphincter, or LES). Normally, the LES muscle closes after food passes through the esophagus to the stomach. When the LES is weakened or abnormal, it does not close properly, and that allows food and stomach acid to go back up into the esophagus. The LES can be weakened by certain dietary substances, medicines, and medical conditions, including:  Tobacco use.  Pregnancy.  Having a hiatal hernia.  Heavy alcohol use.  Certain foods and beverages, such as coffee, chocolate, onions, and peppermint. What increases the risk? This condition is more likely to develop in:  People who have an increased  body weight.  People who have connective tissue disorders.  People who use NSAID medicines. What are the signs or symptoms? Symptoms of this condition include:  Heartburn.  Difficult or painful swallowing.  The feeling of having a lump in the throat.  Abitter taste in the mouth.  Bad breath.  Having a large amount of saliva.  Having an upset or bloated stomach.  Belching.  Chest pain.  Shortness of breath or wheezing.  Ongoing (chronic) cough or a night-time cough.  Wearing away of tooth enamel.  Weight loss. Different conditions can cause chest pain. Make sure to see your health care provider if you experience chest pain. How is this diagnosed? Your health care provider will take a medical history and perform a physical exam. To determine if you have mild or severe GERD, your health care provider may also monitor how you respond to treatment. You may also have other tests, including:  An endoscopy toexamine your stomach and esophagus with a small camera.  A test thatmeasures the acidity level in your esophagus.  A test thatmeasures how much pressure is on your esophagus.  A barium swallow or modified barium swallow to show the shape, size, and functioning of your esophagus. How is this treated? The goal of treatment is to help relieve your symptoms and to prevent complications. Treatment for this condition may vary depending on how severe your symptoms are. Your health care provider may recommend:  Changes to your diet.  Medicine.  Surgery. Follow these instructions at home: Diet  Follow a diet as recommended by your health care provider. This may involve avoiding  foods and drinks such as:  Coffee and tea (with or without caffeine).  Drinks that containalcohol.  Energy drinks and sports drinks.  Carbonated drinks or sodas.  Chocolate and cocoa.  Peppermint and mint flavorings.  Garlic and onions.  Horseradish.  Spicy and acidic foods,  including peppers, chili powder, curry powder, vinegar, hot sauces, and barbecue sauce.  Citrus fruit juices and citrus fruits, such as oranges, lemons, and limes.  Tomato-based foods, such as red sauce, chili, salsa, and pizza with red sauce.  Fried and fatty foods, such as donuts, french fries, potato chips, and high-fat dressings.  High-fat meats, such as hot dogs and fatty cuts of red and white meats, such as rib eye steak, sausage, ham, and bacon.  High-fat dairy items, such as whole milk, butter, and cream cheese.  Eat small, frequent meals instead of large meals.  Avoid drinking large amounts of liquid with your meals.  Avoid eating meals during the 2-3 hours before bedtime.  Avoid lying down right after you eat.  Do not exercise right after you eat. General instructions  Pay attention to any changes in your symptoms.  Take over-the-counter and prescription medicines only as told by your health care provider. Do not take aspirin, ibuprofen, or other NSAIDs unless your health care provider told you to do so.  Do not use any tobacco products, including cigarettes, chewing tobacco, and e-cigarettes. If you need help quitting, ask your health care provider.  Wear loose-fitting clothing. Do not wear anything tight around your waist that causes pressure on your abdomen.  Raise (elevate) the head of your bed 6 inches (15cm).  Try to reduce your stress, such as with yoga or meditation. If you need help reducing stress, ask your health care provider.  If you are overweight, reduce your weight to an amount that is healthy for you. Ask your health care provider for guidance about a safe weight loss goal.  Keep all follow-up visits as told by your health care provider. This is important. Contact a health care provider if:  You have new symptoms.  You have unexplained weight loss.  You have difficulty swallowing, or it hurts to swallow.  You have wheezing or a persistent  cough.  Your symptoms do not improve with treatment.  You have a hoarse voice. Get help right away if:  You have pain in your arms, neck, jaw, teeth, or back.  You feel sweaty, dizzy, or light-headed.  You have chest pain or shortness of breath.  You vomit and your vomit looks like blood or coffee grounds.  You faint.  Your stool is bloody or black.  You cannot swallow, drink, or eat. This information is not intended to replace advice given to you by your health care provider. Make sure you discuss any questions you have with your health care provider. Document Released: 06/01/2005 Document Revised: 01/20/2016 Document Reviewed: 12/17/2014 Elsevier Interactive Patient Education  2017 [employer].

## 2016-07-25 NOTE — Progress Notes (Signed)
Chief Complaint  Patient presents with  . Abdominal Pain    HPI   Bloating in  Her upper abdomen Pt reports that she has this upper abdominal pain that feels like a pressure It started a week now that felt like something is pushing up on her upper abdomen. She states that she noticed more gassiness. She is enrolled in a weight loss program through [facility] and has lost 12 pounds since January but denies a recent radical dietary change. She does not know of a trigger specifically but it seems worse after she eats She reports that she does not get the same pain as when she typically gets heart burn which seems to be more in her chest.   She had a tubal ligation.  She reports that she has regular menses. Patient's last menstrual period was 07/22/2016 (approximate). She states that her tubal ligation was done in 2014  She denies urinary symptoms but admits she has been on her period since 07/22/2016 so would not be able to really tell. She reports that she had breakfast but skipped lunch.  She reports that she is still on her period so that there would be blood in the urine.    Obesity She is doing a weight loss program and has lost 12 pounds in 11 months.  She states that her weight has stabilized. She drinks plenty of water but could use more exercise.  Wt Readings from Last 3 Encounters:  07/25/16 259 lb (117.5 kg)  02/09/16 255 lb (115.7 kg)  07/21/15 267 lb 12.8 oz (121.5 kg)   Body mass index is 39.38 kg/m.   Past Medical History:  Diagnosis Date  . Allergy   . Medical history non-contributory     Current Outpatient Prescriptions  Medication Sig Dispense Refill  . fluticasone (FLONASE) 50 MCG/ACT nasal spray Place 2 sprays into both nostrils daily. 16 g 0  . loratadine (CLARITIN) 10 MG tablet Take 10 mg by mouth daily.    . Omeprazole 20 MG TBEC Take 1 tablet (20 mg total) by mouth daily before breakfast. For six weeks 30 each 1   No current  facility-administered medications for this visit.     Allergies:  Allergies  Allergen Reactions  . Penicillins Other (See Comments)    Get yeast infection  . Latex Rash    Past Surgical History:  Procedure Laterality Date  . BREAST REDUCTION SURGERY Bilateral   . BREAST SURGERY    . TUBAL LIGATION Bilateral 09/05/2013   Procedure: POST PARTUM TUBAL LIGATION;  Surgeon: Alwyn Pea, MD;  Location: [facility];  Service: Gynecology;  Laterality: Bilateral;    Social History   Social History  . Marital status: Married    Spouse name: N/A  . Number of children: N/A  . Years of education: N/A   Social History Main Topics  . Smoking status: Never Smoker  . Smokeless tobacco: Never Used  . Alcohol use No  . Drug use: No  . Sexual activity: Yes    Birth control/ protection: None   Other Topics Concern  . None   Social History Narrative  . None    ROS  Objective: Vitals:   07/25/16 1400  BP: 130/80  Pulse: 88  Resp: 17  Temp: 98.6 F (37 C)  TempSrc: Oral  SpO2: 98%  Weight: 259 lb (117.5 kg)  Height: 5\' 8"  (1.727 m)    Physical Exam  Constitutional: She is oriented to person, place, and time.  She appears well-developed and well-nourished.  HENT:  Head: Normocephalic and atraumatic.  Cardiovascular: Normal rate, regular rhythm and normal heart sounds.   No murmur heard. Pulmonary/Chest: Effort normal and breath sounds normal. No respiratory distress.  Abdominal: Soft. Bowel sounds are normal. She exhibits no distension. There is tenderness.    Neurological: She is alert and oriented to person, place, and time. No cranial nerve deficit.  Skin: Skin is warm. Capillary refill takes less than 2 seconds. No erythema.  Psychiatric: She has a normal mood and affect. Her behavior is normal. Judgment and thought content normal.    Assessment and Plan Carol Parks was seen today for abdominal pain.  Diagnoses and all orders for this visit:  Acute abdominal pain-  advised pt to take simethicone and omeprazole for her abdominal bloating and distention. -     POCT Microscopic Urinalysis ([facility]) -     POCT urinalysis dipstick -     CBC -     Comprehensive metabolic panel  Gastroesophageal reflux disease, esophagitis presence not specified- based on abdominal exam and history will treat with PPI and simethicone Advised continued hydration -     H. pylori breath test -     Omeprazole 20 MG TBEC; Take 1 tablet (20 mg total) by mouth daily before breakfast. For six weeks   Class 2 obesity due to excess calories without serious comorbidity with body mass index (BMI) of 39.0 to 39.9 in adult- improved with weight loss from morbid obesity to obesity Advised pt to continue weight loss to improve reflux     Carol Parks

## 2016-07-26 LAB — H. PYLORI BREATH TEST: H. PYLORI BREATH TEST: NOT DETECTED

## 2016-11-21 ENCOUNTER — Ambulatory Visit (INDEPENDENT_AMBULATORY_CARE_PROVIDER_SITE_OTHER): Payer: Commercial Managed Care - HMO | Admitting: Family Medicine

## 2016-11-21 VITALS — BP 144/85 | HR 87 | Temp 98.1°F | Resp 16 | Ht 68.0 in | Wt 265.0 lb

## 2016-11-21 DIAGNOSIS — R079 Chest pain, unspecified: Secondary | ICD-10-CM | POA: Insufficient documentation

## 2016-11-21 MED ORDER — OMEPRAZOLE 20 MG PO TBEC: 1.0000 | DELAYED_RELEASE_TABLET | Freq: Every day | ORAL | 1 refills | Status: DC

## 2016-11-21 NOTE — Patient Instructions (Addendum)
  Thank you for coming in,   Please seek immediate care if your chest pain becomes worse.   Please let us know if you have re-occurring chest pain that isn't helped with your reflux medication.    Please feel free to call with any questions or concerns at any time, at [phone]. --Dr. Raeford Razor   Costochondritis Costochondritis is swelling and irritation (inflammation) of the tissue (cartilage) that connects your ribs to your breastbone (sternum). This causes pain in the front of your chest. Usually, the pain:  Starts gradually.  Is in more than one rib. This condition usually goes away on its own over time. Follow these instructions at home:  Do not do anything that makes your pain worse.  If directed, put ice on the painful area:  Put ice in a plastic bag.  Place a towel between your skin and the bag.  Leave the ice on for 20 minutes, 2-3 times a day.  If directed, put heat on the affected area as often as told by your doctor. Use the heat source that your doctor tells you to use, such as a moist heat pack or a heating pad.  Place a towel between your skin and the heat source.  Leave the heat on for 20-30 minutes.  Take off the heat if your skin turns bright red. This is very important if you cannot feel pain, heat, or cold. You may have a greater risk of getting burned.  Take over-the-counter and prescription medicines only as told by your doctor.  Return to your normal activities as told by your doctor. Ask your doctor what activities are safe for you.  Keep all follow-up visits as told by your doctor. This is important. Contact a doctor if:  You have chills or a fever.  Your pain does not go away or it gets worse.  You have a cough that does not go away. Get help right away if:  You are short of breath. This information is not intended to replace advice given to you by your health care provider. Make sure you discuss any questions you have with your health  care provider. Document Released: 02/08/2008 Document Revised: 03/11/2016 Document Reviewed: 12/16/2015 Elsevier Interactive Patient Education  2017 [employer].   IF you received an x-ray today, you will receive an invoice from [facility]. Please contact [facility] at [phone] with questions or concerns regarding your invoice.   IF you received labwork today, you will receive an invoice from [place] [facility]. Please contact [facility] at [phone] with questions or concerns regarding your invoice.   Our billing staff will not be able to assist you with questions regarding bills from these companies.  You will be contacted with the lab results as soon as they are available. The fastest way to get your results is to activate your My Chart account. Instructions are located on the last page of this paperwork. If you have not heard from [place] regarding the results in 2 weeks, please contact this office.

## 2016-11-21 NOTE — Assessment & Plan Note (Signed)
Appears to be related to her reflux or possible costochondritis. Has a low risk of it being cardiac in nature.  - EKG today. Similar to previous  - omeprazole sent  - f/u if continues. May refer to cardiology in the future.

## 2016-11-21 NOTE — Progress Notes (Signed)
     Subjective:    Patient ID: Carol Parks, female    DOB: 1971/05/28, 46 y.o.   MRN: [id number]  Chief Complaint  Patient presents with  . Chest Pain    started Saturday for a few min then went away took ASA    PCP: No PCP Per Patient  HPI  This is a 46 y.o. female who is presenting with chest pain. Had sharp left sided chest pain that started on Saturday. Didn't take her omeprazole on Saturday and the pain resolved with taking it. This occurred on Sunday and resolved on Sunday after taking omeprazole. She felt like the chest pain felt different. Denies any neck pain or shoulder pain. No nausea or vomiting. No history of prior chest pain. No recent strenuous exercise. Not associated with activity. Lasted about a minute and resolved.  Not under any more stress than normal. No new medications or recent travel. No recent illness.   Review of Systems  ROS: No unexpected weight loss, fever, chills, swelling, instability, muscle pain, numbness/tingling, redness, otherwise see HPI   Surgical: BTL, breast reduction Shx: no tobacco or alcohol use  Fhx: father with MI in his 32's    Patient Active Problem List   Diagnosis Date Noted  . Chest pain 11/21/2016  . Latex allergy 09/03/2013  . Hx of reduction mammoplasty 09/03/2013     Prior to Admission medications   Medication Sig Start Date End Date Taking? Authorizing Provider  fluticasone (FLONASE) 50 MCG/ACT nasal spray Place 2 sprays into both nostrils daily. 06/29/16   Chelle Jeffery, PA-C  loratadine (CLARITIN) 10 MG tablet Take 10 mg by mouth daily.    Historical Provider, MD  Omeprazole 20 MG TBEC Take 1 tablet (20 mg total) by mouth daily before breakfast. For six weeks 07/25/16   Forrest Moron, MD     Allergies  Allergen Reactions  . Penicillins Other (See Comments)    Get yeast infection  . Latex Rash      Objective:   Physical Exam BP (!) 144/85 (BP Location: Right Arm, Patient Position: Sitting, Cuff Size:  Large)   Pulse 87   Temp 98.1 F (36.7 C) (Oral)   Resp 16   Ht 5\' 8"  (1.727 m)   Wt 265 lb (120.2 kg)   SpO2 99%   BMI 40.29 kg/m  Gen: NAD, alert, cooperative with exam, well-appearing HEENT: NCAT, EOMI, clear conjunctiva,  CV: RRR, good S1/S2, no murmur, no edema, capillary refill brisk  Resp: CTABL, no wheezes, non-labored Skin: no rashes, normal turgor  Neuro: no gross deficits.  Psych: alert and oriented      Assessment & Plan:   Chest pain Appears to be related to her reflux or possible costochondritis. Has a low risk of it being cardiac in nature.  - EKG today. Similar to previous  - omeprazole sent  - f/u if continues. May refer to cardiology in the future.

## 2016-11-22 ENCOUNTER — Telehealth: Payer: Self-pay | Admitting: General Practice

## 2016-11-22 NOTE — Telephone Encounter (Signed)
Pt needs to talk with someone about her office visit from yesterday she states that she thought of more questions since the visit   Best number [phone]
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
## 2016-11-28 NOTE — Telephone Encounter (Signed)
Spoke with patient about her questions.    Rosemarie Ax, MD PGY-4, [facility] 11/28/2016, 8:48 AM

## 2017-03-29 DIAGNOSIS — Z1231 Encounter for screening mammogram for malignant neoplasm of breast: Secondary | ICD-10-CM | POA: Diagnosis not present

## 2017-05-13 DIAGNOSIS — K449 Diaphragmatic hernia without obstruction or gangrene: Secondary | ICD-10-CM

## 2017-05-13 DIAGNOSIS — K219 Gastro-esophageal reflux disease without esophagitis: Secondary | ICD-10-CM | POA: Insufficient documentation

## 2017-05-13 DIAGNOSIS — M25521 Pain in right elbow: Secondary | ICD-10-CM | POA: Diagnosis not present

## 2017-05-13 DIAGNOSIS — R030 Elevated blood-pressure reading, without diagnosis of hypertension: Secondary | ICD-10-CM | POA: Diagnosis not present

## 2017-05-24 ENCOUNTER — Encounter: Payer: Self-pay | Admitting: Gastroenterology

## 2017-05-24 ENCOUNTER — Encounter: Payer: Self-pay | Admitting: Physician Assistant

## 2017-05-24 ENCOUNTER — Ambulatory Visit (INDEPENDENT_AMBULATORY_CARE_PROVIDER_SITE_OTHER): Payer: 59 | Admitting: Physician Assistant

## 2017-05-24 VITALS — BP 130/89 | HR 98 | Temp 98.7°F | Resp 16 | Ht 64.0 in | Wt 268.6 lb

## 2017-05-24 DIAGNOSIS — K219 Gastro-esophageal reflux disease without esophagitis: Secondary | ICD-10-CM

## 2017-05-24 DIAGNOSIS — R079 Chest pain, unspecified: Secondary | ICD-10-CM | POA: Diagnosis not present

## 2017-05-24 NOTE — Progress Notes (Signed)
Carol Parks  MRN: [id number] DOB: 10/14/1970  PCP: Patient, No Pcp Per  Subjective:  Pt is a pleasant 46 year old female who presents to clinic for heartburn x 3 days.    Heartburn x 3 days ago. Resolved with antiacids.  Last night "it hit so bad". She woke up about 4 am to use the restroom, while she was sitting on the toilet, the pain started. Pain is on right side of upper chest and went across to left side. Pain is described as sharp. Did not get better with antiacid. Felt better with lifting her breasts and walking around.  Worse when she laid down. Episode lasted about 45 minutes. Not associated with activity. She denies orthopnea, shob with activity, shob at rest, nausea, diaphoresis, abdominal pain, neck pain, arm pain.  Omeprazole qd x 4 months. She has not recently missed dose of her PPI. No increase in physical activity. No change in diet. Denies increased stressors. No tobacco or alcohol use  She was here for the similar problem about 6 months ago. EKG negative. Negative H Pylori. Assessment at that OV "Appears to be related to her reflux or possible costochondritis. Has a low risk of it being cardiac in nature"  Father and mother both have GERD.   Cardiac Fhx:  Father - MIx2. MI in his 23's Mother - HTN Multiple aunts and uncles had CABG MGM - MI PGM - MI PGF - MI  Review of Systems  Constitutional: Negative for diaphoresis.  Respiratory: Negative for cough and chest tightness.   Cardiovascular: Positive for chest pain. Negative for palpitations and leg swelling.  Gastrointestinal: Negative for abdominal pain, diarrhea, nausea and vomiting.  Musculoskeletal: Negative for arthralgias, back pain and neck pain.  Neurological: Negative for dizziness, syncope, light-headedness and headaches.    Patient Active Problem List   Diagnosis Date Noted  . Chest pain 11/21/2016  . Latex allergy 09/03/2013  . Hx of reduction mammoplasty 09/03/2013    Current Outpatient  Prescriptions on File Prior to Visit  Medication Sig Dispense Refill  . fluticasone (FLONASE) 50 MCG/ACT nasal spray Place 2 sprays into both nostrils daily. 16 g 0  . Omeprazole 20 MG TBEC Take 1 tablet (20 mg total) by mouth daily before breakfast. For six weeks 30 each 1  . loratadine (CLARITIN) 10 MG tablet Take 10 mg by mouth daily.     No current facility-administered medications on file prior to visit.     Allergies  Allergen Reactions  . Penicillins Other (See Comments)    Get yeast infection  . Latex Rash     Objective:  BP 130/89 (BP Location: Left Arm, Cuff Size: Large)   Pulse 98   Temp 98.7 F (37.1 C) (Oral)   Resp 16   Ht 5\' 4"  (1.626 m)   Wt 268 lb 9.6 oz (121.8 kg)   LMP 05/21/2017   SpO2 96%   BMI 46.11 kg/m   Physical Exam  Constitutional: She is oriented to person, place, and time and well-developed, well-nourished, and in no distress. No distress.  Cardiovascular: Normal rate, regular rhythm and normal heart sounds.   Pulmonary/Chest: Effort normal and breath sounds normal. She exhibits tenderness. She exhibits no crepitus.    Musculoskeletal:       Right lower leg: She exhibits edema (+1).       Left lower leg: She exhibits edema (+1).  Neurological: She is alert and oriented to person, place, and time. GCS score is 15.  Skin: Skin is warm and dry.  Psychiatric: Mood, memory, affect and judgment normal.  Vitals reviewed.  EKG shows normal sinus rhythm. No ST elevations or flipped t waves. Unchanged from 11/2016. Assessment and Plan :  1. Chest pain, unspecified type - Lipid panel - EKG 12-Lead - EKG is not concerning for her chest pain as cardiac in origin. Suspect reflux or ulcer. She was here about 6 months ago for the same with similar test results. Negative H Pylori in the past.  Plan to refer to GI for work-up. She has a FHX of CAD, consider cardiology referral in the future.  2. Gastroesophageal reflux disease, esophagitis presence not  specified - Ambulatory referral to Gastroenterology   Mercer Pod, PA-C  [facility] [place] 05/24/2017 12:03 PM

## 2017-05-24 NOTE — Patient Instructions (Addendum)
You will receive a call to schedule an appt with GI.  Your EKG looks great.  I will contact you with the results of today's lab work.  Work on improving your diet to reduce GERD symptoms.   Thank you for coming in today. I hope you feel we met your needs.  Feel free to call PCP if you have any questions or further requests.  Please consider signing up for MyChart if you do not already have it, as this is a great way to communicate with me.  Best,  Whitney McVey, PA-C   Food Choices for Gastroesophageal Reflux Disease, Adult When you have gastroesophageal reflux disease (GERD), the foods you eat and your eating habits are very important. Choosing the right foods can help ease the discomfort of GERD. Consider working with a diet and nutrition specialist (dietitian) to help you make healthy food choices. What general guidelines should I follow? Eating plan  Choose healthy foods low in fat, such as fruits, vegetables, whole grains, low-fat dairy products, and lean meat, fish, and poultry.  Eat frequent, small meals instead of three large meals each day. Eat your meals slowly, in a relaxed setting. Avoid bending over or lying down until 2-3 hours after eating.  Limit high-fat foods such as fatty meats or fried foods.  Limit your intake of oils, butter, and shortening to less than 8 teaspoons each day.  Avoid the following: ? Foods that cause symptoms. These may be different for different people. Keep a food diary to keep track of foods that cause symptoms. ? Alcohol. ? Drinking large amounts of liquid with meals. ? Eating meals during the 2-3 hours before bed.  Cook foods using methods other than frying. This may include baking, grilling, or broiling. Lifestyle   Maintain a healthy weight. Ask your health care provider what weight is healthy for you. If you need to lose weight, work with your health care provider to do so safely.  Exercise for at least 30 minutes on 5 or more days  each week, or as told by your health care provider.  Avoid wearing clothes that fit tightly around your waist and chest.  Do not use any products that contain nicotine or tobacco, such as cigarettes and e-cigarettes. If you need help quitting, ask your health care provider.  Sleep with the head of your bed raised. Use a wedge under the mattress or blocks under the bed frame to raise the head of the bed. What foods are not recommended? The items listed may not be a complete list. Talk with your dietitian about what dietary choices are best for you. Grains Pastries or quick breads with added fat. [place] toast. Vegetables Deep fried vegetables. [place] fries. Any vegetables prepared with added fat. Any vegetables that cause symptoms. For some people this may include tomatoes and tomato products, chili peppers, onions and garlic, and horseradish. Fruits Any fruits prepared with added fat. Any fruits that cause symptoms. For some people this may include citrus fruits, such as oranges, grapefruit, pineapple, and lemons. Meats and other protein foods High-fat meats, such as fatty beef or pork, hot dogs, ribs, ham, sausage, salami and bacon. Fried meat or protein, including fried fish and fried chicken. Nuts and nut butters. Dairy Whole milk and chocolate milk. Sour cream. Cream. Ice cream. Cream cheese. Milk shakes. Beverages Coffee and tea, with or without caffeine. Carbonated beverages. Sodas. Energy drinks. Fruit juice made with acidic fruits (such as orange or grapefruit). Tomato juice. Alcoholic drinks.  Fats and oils Butter. Margarine. Shortening. Ghee. Sweets and desserts Chocolate and cocoa. Donuts. Seasoning and other foods Pepper. Peppermint and spearmint. Any condiments, herbs, or seasonings that cause symptoms. For some people, this may include curry, hot sauce, or vinegar-based salad dressings. Summary  When you have gastroesophageal reflux disease (GERD), food and lifestyle choices  are very important to help ease the discomfort of GERD.  Eat frequent, small meals instead of three large meals each day. Eat your meals slowly, in a relaxed setting. Avoid bending over or lying down until 2-3 hours after eating.  Limit high-fat foods such as fatty meat or fried foods. This information is not intended to replace advice given to you by your health care provider. Make sure you discuss any questions you have with your health care provider. Document Released: 08/22/2005 Document Revised: 08/23/2016 Document Reviewed: 08/23/2016 Elsevier Interactive Patient Education  2017 [employer].   IF you received an x-ray today, you will receive an invoice from [facility]. Please contact [facility] at [phone] with questions or concerns regarding your invoice.   IF you received labwork today, you will receive an invoice from [facility]. Please contact [facility] at [phone] with questions or concerns regarding your invoice.   Our billing staff will not be able to assist you with questions regarding bills from these companies.  You will be contacted with the lab results as soon as they are available. The fastest way to get your results is to activate your My Chart account. Instructions are located on the last page of this paperwork. If you have not heard from [place] regarding the results in 2 weeks, please contact this office.

## 2017-05-25 LAB — LIPID PANEL
Chol/HDL Ratio: 3.6 ratio (ref 0.0–4.4)
Cholesterol, Total: 188 mg/dL (ref 100–199)
HDL: 52 mg/dL (ref >39)
LDL Calculated: 118 mg/dL — ABNORMAL HIGH (ref 0–99)
Triglycerides: 91 mg/dL (ref 0–149)
VLDL Cholesterol Cal: 18 mg/dL (ref 5–40)

## 2017-06-06 ENCOUNTER — Encounter: Payer: Self-pay | Admitting: Gastroenterology

## 2017-06-06 ENCOUNTER — Ambulatory Visit (INDEPENDENT_AMBULATORY_CARE_PROVIDER_SITE_OTHER): Payer: 59 | Admitting: Gastroenterology

## 2017-06-06 VITALS — BP 132/82 | HR 104 | Ht 64.0 in | Wt 273.0 lb

## 2017-06-06 DIAGNOSIS — R1013 Epigastric pain: Secondary | ICD-10-CM | POA: Diagnosis not present

## 2017-06-06 DIAGNOSIS — R079 Chest pain, unspecified: Secondary | ICD-10-CM | POA: Diagnosis not present

## 2017-06-06 DIAGNOSIS — K219 Gastro-esophageal reflux disease without esophagitis: Secondary | ICD-10-CM | POA: Diagnosis not present

## 2017-06-06 NOTE — Patient Instructions (Signed)
You have been scheduled for an endoscopy. Please follow written instructions given to you at your visit today. If you use inhalers (even only as needed), please bring them with you on the day of your procedure. Your physician has requested that you go to www.startemmi.com and enter the access code given to you at your visit today. This web site gives a general overview about your procedure. However, you should still follow specific instructions given to you by our office regarding your preparation for the procedure.  If you are age 31 or older, your body mass index should be between 23-30. Your Body mass index is 46.86 kg/m. If this is out of the aforementioned range listed, please consider follow up with your Primary Care Provider.  If you are age 2 or younger, your body mass index should be between 19-25. Your Body mass index is 46.86 kg/m. If this is out of the aformentioned range listed, please consider follow up with your Primary Care Provider.   Thank you.

## 2017-06-06 NOTE — Addendum Note (Signed)
Addended by: Yetta Flock on: 06/06/2017 03:54 PM   Modules accepted: Level of Service

## 2017-06-06 NOTE — Progress Notes (Signed)
HPI :  46 y/o female with a history of obesity, GERD, otherwise healthy, here for a new patient referral for GERD / chest pain / epigastric pain  From [facility].    Patient referred for chest discomfort, ongoing about a year. She would feel it about once per month when it started.  She had an EKG which looked okay.   Chest / epigastric discomfort - she has had a history of heartburn in the past, and has had some other chest pains on top of this.  She has felt it on the right side of her chest and left side of her chest - 2 discrete episodes which were severe chest pain, not doing anything in particular which brought it out. Last episode occurred about 2 weeks ago. She went to urgent care and had an EKG done, told it was normal. She has had a few episodes since it began. This pain was different from her heartburn. She otherwise would have heartburn often she feels it after eating something if she does not take any antacids.. She has had some dysphagia in the past - previously this was frequent but not bothering her much any more. No vomiting. She has had some episodes of sporadic epigastric pain as well. No exertional symptoms of chest pain or shortness of breath. She has been on the omeprazole since last June - she is on 20mg  once daily. She states it works well for her in regards to her regular heartburn symptoms. She states she is compliant with it.   Father had MI x 2 prior to age 58. Grandmother and grandfather with CAD. No stress test in the past. No tobacco use. No h/o HLD. No FH of esophageal cancer  H pylori breath test 07/25/2016 - negative  Past Medical History:  Diagnosis Date  . Allergy   . GERD (gastroesophageal reflux disease)   . Medical history non-contributory      Past Surgical History:  Procedure Laterality Date  . BREAST REDUCTION SURGERY Bilateral   . BREAST SURGERY    . TUBAL LIGATION Bilateral 09/05/2013   Procedure: POST PARTUM TUBAL LIGATION;  Surgeon:  Alwyn Pea, MD;  Location: [facility];  Service: Gynecology;  Laterality: Bilateral;   Family History  Problem Relation Age of Onset  . Hypertension Mother   . Cancer Mother   . Heart attack Father   . Aneurysm Father   . Heart attack Maternal Grandmother   . Heart attack Paternal Grandmother   . Heart attack Paternal Grandfather   . Asthma Daughter   . Mental retardation Paternal Aunt    Social History  Substance Use Topics  . Smoking status: Never Smoker  . Smokeless tobacco: Never Used  . Alcohol use No   Current Outpatient Prescriptions  Medication Sig Dispense Refill  . fluticasone (FLONASE) 50 MCG/ACT nasal spray Place 2 sprays into both nostrils daily. 16 g 0  . loratadine (CLARITIN) 10 MG tablet Take 10 mg by mouth daily.    . Omeprazole 20 MG TBEC Take 1 tablet (20 mg total) by mouth daily before breakfast. For six weeks 30 each 1   No current facility-administered medications for this visit.    Allergies  Allergen Reactions  . Penicillins Other (See Comments)    Get yeast infection  . Latex Rash     Review of Systems: All systems reviewed and negative except where noted in HPI.   Lab Results  Component Value Date   WBC 9.1  07/25/2016   HGB 13.5 07/25/2016   HCT 40.9 07/25/2016   MCV 89.5 07/25/2016   PLT 323 07/25/2016    Lab Results  Component Value Date   CREATININE 0.63 07/25/2016   BUN 11 07/25/2016   NA 138 07/25/2016   K 4.1 07/25/2016   CL 105 07/25/2016   CO2 26 07/25/2016    Lab Results  Component Value Date   ALT 9 07/25/2016   AST 11 07/25/2016   ALKPHOS 69 07/25/2016   BILITOT 0.3 07/25/2016     Physical Exam: BP 132/82   Pulse (!) 104   Ht 5\' 4"  (1.626 m)   Wt 273 lb (123.8 kg)   LMP 05/21/2017   BMI 46.86 kg/m  Constitutional: Pleasant,well-developed, female in no acute distress. HEENT: Normocephalic and atraumatic. Conjunctivae are normal. No scleral icterus. Neck supple.  Cardiovascular: Normal rate, regular  rhythm.  Pulmonary/chest: Effort normal and breath sounds normal. No wheezing, rales or rhonchi. Abdominal: Soft, nontender, protuberant. There are no masses palpable. No hepatomegaly. Extremities: no edema Lymphadenopathy: No cervical adenopathy noted. Neurological: Alert and oriented to person place and time. Skin: Skin is warm and dry. No rashes noted. Psychiatric: Normal mood and affect. Behavior is normal.   ASSESSMENT AND PLAN: 46 year old female with history as outlined above here for new patient visit.  She has a history of reflux which appears fairly well controlled on current dosing of omeprazole, with a few discrete episodes of chest / epigastric pain despite compliance with PPI. It is possible that the symptoms are due to reflux, although her baseline reflux symptoms again seemed very well controlled. An upper endoscopy is reasonable to evaluate for active esophagitis, rule out H. Pylori/PUD in regards to epigastric pain (unclear breath test was done on PPI). I discussed risks and benefits of endoscopy with the patient and she wanted to proceed. Otherwise, she has a strong family history of cardiovascular disease in her family, her father had 2 MIs prior to the age of 3. She is most concerned about the possibility of atypical ischemic symptoms. While she has no exertional symptoms, with a normal prior EKG, I think a stress test may be reasonable and provide reassurance to her. She wants to discuss this issue further with her primary care  Patient agreed with the plan as outlined. All questions answered.   Lahoma Cellar, MD [facility] Pager [phone]  CC: McVey, Letta Median*

## 2017-06-07 ENCOUNTER — Ambulatory Visit (AMBULATORY_SURGERY_CENTER): Payer: 59 | Admitting: Gastroenterology

## 2017-06-07 ENCOUNTER — Encounter: Payer: Self-pay | Admitting: Gastroenterology

## 2017-06-07 VITALS — BP 123/74 | HR 84 | Temp 99.1°F | Resp 11 | Ht 64.0 in | Wt 273.0 lb

## 2017-06-07 DIAGNOSIS — R1013 Epigastric pain: Secondary | ICD-10-CM

## 2017-06-07 DIAGNOSIS — K2950 Unspecified chronic gastritis without bleeding: Secondary | ICD-10-CM | POA: Diagnosis not present

## 2017-06-07 DIAGNOSIS — K219 Gastro-esophageal reflux disease without esophagitis: Secondary | ICD-10-CM

## 2017-06-07 MED ORDER — SODIUM CHLORIDE 0.9 % IV SOLN: 500.0000 mL | INTRAVENOUS | Status: DC

## 2017-06-07 NOTE — Progress Notes (Signed)
Called to room to assist during endoscopic procedure.  Patient ID and intended procedure confirmed with present staff. Received instructions for my participation in the procedure from the performing physician.  

## 2017-06-07 NOTE — Op Note (Signed)
[place] Patient Name: Lelani Garnett Procedure Date: 06/07/2017 9:46 AM MRN: [id number] Endoscopist: Remo Lipps P. Emanual Lamountain MD, MD Age: 46 Referring MD:  Date of Birth: 11-23-1970 Gender: Female Account #: [id number] Procedure:                Upper GI endoscopy Indications:              Epigastric abdominal pain, history of heartburn,                            Unexplained intermittent chest pain Medicines:                Monitored Anesthesia Care Procedure:                Pre-Anesthesia Assessment:                           - Prior to the procedure, a History and Physical                            was performed, and patient medications and                            allergies were reviewed. The patient's tolerance of                            previous anesthesia was also reviewed. The risks                            and benefits of the procedure and the sedation                            options and risks were discussed with the patient.                            All questions were answered, and informed consent                            was obtained. Prior Anticoagulants: The patient has                            taken no previous anticoagulant or antiplatelet                            agents. ASA Grade Assessment: III - A patient with                            severe systemic disease. After reviewing the risks                            and benefits, the patient was deemed in                            satisfactory condition to undergo the procedure.  After obtaining informed consent, the endoscope was                            passed under direct vision. Throughout the                            procedure, the patient's blood pressure, pulse, and                            oxygen saturations were monitored continuously. The                            Endoscope was introduced through the mouth, and                            advanced to  the second part of duodenum. The upper                            GI endoscopy was accomplished without difficulty.                            The patient tolerated the procedure well. Scope In: Scope Out: Findings:                 Esophagogastric landmarks were identified: the                            Z-line was found at 33 cm, the gastroesophageal                            junction was found at 33 cm and the upper extent of                            the gastric folds was found at 38 cm from the                            incisors.                           A 5 cm hiatal hernia was present.                           A single small area of ectopic gastric mucosa was                            found in the upper third of the esophagus, 23 cm                            from the incisors.                           The exam of the esophagus was otherwise normal. No  esophagitis or inflammatory changes appreciated.                           The entire examined stomach was normal. Biopsies                            were taken from the antrum, body, and incisura with                            a cold forceps biopsy for H pylori testing.                           The duodenal bulb and second portion of the                            duodenum were normal. Complications:            No immediate complications. Estimated blood loss:                            Minimal. Estimated Blood Loss:     Estimated blood loss was minimal. Impression:               - Esophagogastric landmarks identified.                           - 5 cm hiatal hernia.                           - Ectopic gastric mucosa in the upper third of the                            esophagus.                           - Normal esophagus otherwise - no inflammatory                            changes                           - Normal stomach - biopsies obtained to rule out H                             pylori                           - Normal duodenal bulb and second portion of the                            duodenum. Recommendation:           - Patient has a contact number available for                            emergencies. The signs and symptoms of potential  delayed complications were discussed with the                            patient. Return to normal activities tomorrow.                            Written discharge instructions were provided to the                            patient.                           - Resume previous diet.                           - Continue present medications.                           - Await pathology results                           - Follow up with primary care to discuss                            possibility for cardiac stress testing Sharelle Burditt P. Elka Satterfield MD, MD 06/07/2017 10:03:59 AM This report has been signed electronically.

## 2017-06-07 NOTE — Progress Notes (Signed)
Report given to PACU, vss 

## 2017-06-07 NOTE — Patient Instructions (Signed)
YOU HAD AN ENDOSCOPIC PROCEDURE TODAY AT THE [facility]:   Refer to the procedure report that was given to you for any specific questions about what was found during the examination.  If the procedure report does not answer your questions, please call your gastroenterologist to clarify.  If you requested that your care partner not be given the details of your procedure findings, then the procedure report has been included in a sealed envelope for you to review at your convenience later.  YOU SHOULD EXPECT: Some feelings of bloating in the abdomen. Passage of more gas than usual.  Walking can help get rid of the air that was put into your GI tract during the procedure and reduce the bloating. If you had a lower endoscopy (such as a colonoscopy or flexible sigmoidoscopy) you may notice spotting of blood in your stool or on the toilet paper. If you underwent a bowel prep for your procedure, you may not have a normal bowel movement for a few days.  Please Note:  You might notice some irritation and congestion in your nose or some drainage.  This is from the oxygen used during your procedure.  There is no need for concern and it should clear up in a day or so.  SYMPTOMS TO REPORT IMMEDIATELY:    Following upper endoscopy (EGD)  Vomiting of blood or coffee ground material  New chest pain or pain under the shoulder blades  Painful or persistently difficult swallowing  New shortness of breath  Fever of 100F or higher  Black, tarry-looking stools  For urgent or emergent issues, a gastroenterologist can be reached at any hour by calling [phone].    DIET:  We do recommend a small meal at first, but then you may proceed to your regular diet.  Drink plenty of fluids but you should avoid alcoholic beverages for 24 hours.  ACTIVITY:  You should plan to take it easy for the rest of today and you should NOT DRIVE or use heavy machinery until tomorrow (because of the sedation medicines  used during the test).    FOLLOW UP: Our staff will call the number listed on your records the next business day following your procedure to check on you and address any questions or concerns that you may have regarding the information given to you following your procedure. If we do not reach you, we will leave a message.  However, if you are feeling well and you are not experiencing any problems, there is no need to return our call.  We will assume that you have returned to your regular daily activities without incident.  If any biopsies were taken you will be contacted by phone or by letter within the next 1-3 weeks.  Please call us at [phone] if you have not heard about the biopsies in 3 weeks.    SIGNATURES/CONFIDENTIALITY: You and/or your care partner have signed paperwork which will be entered into your electronic medical record.  These signatures attest to the fact that that the information above on your After Visit Summary has been reviewed and is understood.  Full responsibility of the confidentiality of this discharge information lies with you and/or your care-partner.  Thank you for letting us take care of your healthcare needs today. 

## 2017-06-08 ENCOUNTER — Telehealth: Payer: Self-pay

## 2017-06-08 NOTE — Telephone Encounter (Signed)
  Follow up Call-  Call back number 06/07/2017  Post procedure Call Back phone  # [phone]  Permission to leave phone message Yes  Some recent data might be hidden     Patient questions:  Do you have a fever, pain , or abdominal swelling? Yes.   Pain Score  5 *  Have you tolerated food without any problems? Yes.    Have you been able to return to your normal activities? Yes.    Do you have any questions about your discharge instructions: Diet   No. Medications  No. Follow up visit  No.  Do you have questions or concerns about your Care? No.  Actions: * If pain score is 4 or above: No action needed, pain <4.  Patient states around jaw area swollen all around. No bruising only when touch is sore. Instructed to apply ice pack and may take pain medication for discomfort and to call if worsen.

## 2017-06-13 ENCOUNTER — Encounter: Payer: Self-pay | Admitting: Gastroenterology

## 2017-08-02 ENCOUNTER — Other Ambulatory Visit: Payer: Self-pay

## 2017-08-02 ENCOUNTER — Encounter: Payer: Self-pay | Admitting: Emergency Medicine

## 2017-08-02 ENCOUNTER — Ambulatory Visit: Payer: 59 | Admitting: Emergency Medicine

## 2017-08-02 VITALS — BP 138/88 | HR 98 | Temp 98.9°F | Resp 16 | Ht 65.0 in | Wt 272.4 lb

## 2017-08-02 DIAGNOSIS — M25521 Pain in right elbow: Secondary | ICD-10-CM

## 2017-08-02 DIAGNOSIS — M778 Other enthesopathies, not elsewhere classified: Secondary | ICD-10-CM

## 2017-08-02 MED ORDER — METHYLPREDNISOLONE ACETATE 80 MG/ML IJ SUSP
80.0000 mg | Freq: Once | INTRAMUSCULAR | Status: AC
  Administered 2017-08-02: 80 mg via INTRAMUSCULAR

## 2017-08-02 MED ORDER — PREDNISONE 20 MG PO TABS: 40.0000 mg | ORAL_TABLET | Freq: Every day | ORAL | 0 refills | Status: AC

## 2017-08-02 NOTE — Progress Notes (Signed)
Carol Parks 46 y.o.   Chief Complaint  Patient presents with  . Elbow Pain    RIGHT x 2weeks    HISTORY OF PRESENT ILLNESS: This is a 46 y.o. female complaining of right elbow pain x 2 weeks.   Arm Pain   The incident occurred more than 1 week ago. The injury mechanism was repetitive motion. The pain is present in the right elbow. The quality of the pain is described as aching. The pain does not radiate. The pain is at a severity of 5/10. The pain is moderate. The pain has been constant since the incident. Pertinent negatives include no chest pain, muscle weakness, numbness or tingling. The symptoms are aggravated by movement. She has tried NSAIDs (tried corticosteroids 2 weeks ago with improvement) for the symptoms.     Prior to Admission medications   Medication Sig Start Date End Date Taking? Authorizing Provider  fluticasone (FLONASE) 50 MCG/ACT nasal spray Place 2 sprays into both nostrils daily. 06/29/16  Yes Jeffery, Chelle, PA-C  loratadine (CLARITIN) 10 MG tablet Take 10 mg by mouth daily.   Yes [provider]  Omeprazole 20 MG TBEC Take 1 tablet (20 mg total) by mouth daily before breakfast. For six weeks 11/21/16  Yes Rosemarie Ax, MD    Allergies  Allergen Reactions  . Penicillins Other (See Comments)    Get yeast infection  . Latex Rash    Patient Active Problem List   Diagnosis Date Noted  . Chest pain 11/21/2016  . Latex allergy 09/03/2013  . Hx of reduction mammoplasty 09/03/2013    Past Medical History:  Diagnosis Date  . Allergy   . GERD (gastroesophageal reflux disease)   . Medical history non-contributory     Past Surgical History:  Procedure Laterality Date  . BREAST REDUCTION SURGERY Bilateral   . BREAST SURGERY    . TUBAL LIGATION Bilateral 09/05/2013   Procedure: POST PARTUM TUBAL LIGATION;  Surgeon: Alwyn Pea, MD;  Location: [facility];  Service: Gynecology;  Laterality: Bilateral;    Social History   Socioeconomic  History  . Marital status: Married    Spouse name: Not on file  . Number of children: Not on file  . Years of education: Not on file  . Highest education level: Not on file  Social Needs  . Financial resource strain: Not on file  . Food insecurity - worry: Not on file  . Food insecurity - inability: Not on file  . Transportation needs - medical: Not on file  . Transportation needs - non-medical: Not on file  Occupational History  . Not on file  Tobacco Use  . Smoking status: Never Smoker  . Smokeless tobacco: Never Used  Substance and Sexual Activity  . Alcohol use: No    Alcohol/week: 0.0 oz  . Drug use: No  . Sexual activity: Yes    Birth control/protection: None  Other Topics Concern  . Not on file  Social History Narrative  . Not on file    Family History  Problem Relation Age of Onset  . Hypertension Mother   . Cancer Mother   . Heart attack Father   . Aneurysm Father   . Heart attack Maternal Grandmother   . Heart attack Paternal Grandmother   . Heart attack Paternal Grandfather   . Asthma Daughter   . Mental retardation Paternal Aunt      Review of Systems  Constitutional: Negative.  Negative for chills and fever.  Respiratory: Negative  for cough and shortness of breath.   Cardiovascular: Negative for chest pain.  Gastrointestinal: Negative for nausea and vomiting.  Musculoskeletal: Positive for joint pain (right elbow).  Skin: Negative for rash.  Neurological: Negative for tingling, speech change, focal weakness and numbness.  All other systems reviewed and are negative.  Vitals:   08/02/17 1210 08/02/17 1215  BP: (!) 160/94 138/88  Pulse: 98   Resp: 16   Temp: 98.9 F (37.2 C)   SpO2: 100%      Physical Exam  Constitutional: She is oriented to person, place, and time. She appears well-developed and well-nourished.  HENT:  Head: Normocephalic and atraumatic.  Eyes: Pupils are equal, round, and reactive to light.  Neck: Normal range of  motion.  Cardiovascular: Normal rate.  Pulmonary/Chest: Effort normal.  Musculoskeletal:  Right elbow: FROM; no erythema or selling; +tenderness radial side on tendon  Neurological: She is alert and oriented to person, place, and time. No sensory deficit. She exhibits normal muscle tone.  Skin: Skin is warm and dry. Capillary refill takes less than 2 seconds.  Psychiatric: She has a normal mood and affect. Her behavior is normal.  Vitals reviewed.    ASSESSMENT & PLAN: Carol Parks was seen today for elbow pain.  Diagnoses and all orders for this visit:  Right elbow pain -     methylPREDNISolone acetate (DEPO-MEDROL) injection 80 mg  Right elbow tendonitis  Other orders -     predniSONE (DELTASONE) 20 MG tablet; Take 2 tablets (40 mg total) by mouth daily with breakfast for 5 days.    Patient Instructions       IF you received an x-ray today, you will receive an invoice from [facility]. Please contact [facility] at [phone] with questions or concerns regarding your invoice.   IF you received labwork today, you will receive an invoice from [facility]. Please contact [facility] at [phone] with questions or concerns regarding your invoice.   Our billing staff will not be able to assist you with questions regarding bills from these companies.  You will be contacted with the lab results as soon as they are available. The fastest way to get your results is to activate your My Chart account. Instructions are located on the last page of this paperwork. If you have not heard from [place] regarding the results in 2 weeks, please contact this office.     Tendinitis Tendinitis is inflammation of a tendon. A tendon is a strong cord of tissue that connects muscle to bone. Tendinitis can affect any tendon, but it most commonly affects the shoulder tendon (rotator cuff), ankle tendon (Achilles tendon), elbow tendon (triceps tendon), or one of the tendons in the wrist. What  are the causes? This condition may be caused by:  Overusing a tendon or muscle. This is common.  Age-related wear and tear.  Injury.  Inflammatory conditions, such as arthritis.  Certain medicines.  What increases the risk? This condition is more likely to develop in people who do activities that involve repetitive motions. What are the signs or symptoms? Symptoms of this condition may include:  Pain.  Tenderness.  Mild swelling.  How is this diagnosed? This condition is diagnosed with a physical exam. You may also have tests, such as:  Ultrasound. This uses sound waves to make an image of your affected area.  MRI.  How is this treated? This condition may be treated by resting, icing, applying pressure (compression), and raising (elevating) the area above the level of  your heart. This is known as RICE therapy. Treatment may also include:  Medicines to help reduce inflammation or to help reduce pain.  Exercises or physical therapy to strengthen and stretch the tendon.  A brace or splint.  Surgery (rare).  Follow these instructions at home:  If you have a splint or brace:  Wear the splint or brace as told by your health care provider. Remove it only as told by your health care provider.  Loosen the splint or brace if your fingers or toes tingle, become numb, or turn cold and blue.  Do not take baths, swim, or use a hot tub until your health care provider approves. Ask your health care provider if you can take showers. You may only be allowed to take sponge baths for bathing.  Do not let your splint or brace get wet if it is not waterproof. ? If your splint or brace is not waterproof, cover it with a watertight plastic bag when you take a bath or a shower.  Keep the splint or brace clean. Managing pain, stiffness, and swelling  If directed, apply ice to the affected area. ? Put ice in a plastic bag. ? Place a towel between your skin and the bag. ? Leave the  ice on for 20 minutes, 2-3 times a day.  If directed, apply heat to the affected area as often as told by your health care provider. Use the heat source that your health care provider recommends, such as a moist heat pack or a heating pad. ? Place a towel between your skin and the heat source. ? Leave the heat on for 20-30 minutes. ? Remove the heat if your skin turns bright red. This is especially important if you are unable to feel pain, heat, or cold. You may have a greater risk of getting burned.  Move the fingers or toes of the affected limb often, if this applies. This can help to prevent stiffness and lessen swelling.  If directed, elevate the affected area above the level of your heart while you are sitting or lying down. Driving  Do not drive or operate heavy machinery while taking prescription pain medicine.  Ask your health care provider when it is safe to drive if you have a splint or brace on any part of your arm or leg. Activity  Return to your normal activities as told by your health care provider. Ask your health care provider what activities are safe for you.  Rest the affected area as told by your health care provider.  Avoid using the affected area while you are experiencing symptoms of tendinitis.  Do exercises as told by your health care provider. General instructions  If you have a splint, do not put pressure on any part of the splint until it is fully hardened. This may take several hours.  Wear an elastic bandage or compression wrap only as told by your health care provider.  Take over-the-counter and prescription medicines only as told by your health care provider.  Keep all follow-up visits as told by your health care provider. This is important. Contact a health care provider if:  Your symptoms do not improve.  You develop new, unexplained problems, such as numbness in your hands. This information is not intended to replace advice given to you by your  health care provider. Make sure you discuss any questions you have with your health care provider. Document Released: 08/19/2000 Document Revised: 04/21/2016 Document Reviewed: 05/25/2015 Elsevier Interactive Patient Education  2018 [employer]vier [employer].       Agustina Caroli, MD [facility]

## 2017-08-02 NOTE — Patient Instructions (Addendum)
   IF you received an x-ray today, you will receive an invoice from [facility]. Please contact [facility] at [phone] with questions or concerns regarding your invoice.   IF you received labwork today, you will receive an invoice from [facility]. Please contact [facility] at [phone] with questions or concerns regarding your invoice.   Our billing staff will not be able to assist you with questions regarding bills from these companies.  You will be contacted with the lab results as soon as they are available. The fastest way to get your results is to activate your My Chart account. Instructions are located on the last page of this paperwork. If you have not heard from us regarding the results in 2 weeks, please contact this office.      Tendinitis Tendinitis is inflammation of a tendon. A tendon is a strong cord of tissue that connects muscle to bone. Tendinitis can affect any tendon, but it most commonly affects the shoulder tendon (rotator cuff), ankle tendon (Achilles tendon), elbow tendon (triceps tendon), or one of the tendons in the wrist. What are the causes? This condition may be caused by:  Overusing a tendon or muscle. This is common.  Age-related wear and tear.  Injury.  Inflammatory conditions, such as arthritis.  Certain medicines.  What increases the risk? This condition is more likely to develop in people who do activities that involve repetitive motions. What are the signs or symptoms? Symptoms of this condition may include:  Pain.  Tenderness.  Mild swelling.  How is this diagnosed? This condition is diagnosed with a physical exam. You may also have tests, such as:  Ultrasound. This uses sound waves to make an image of your affected area.  MRI.  How is this treated? This condition may be treated by resting, icing, applying pressure (compression), and raising (elevating) the area above the level of your heart. This is known as  RICE therapy. Treatment may also include:  Medicines to help reduce inflammation or to help reduce pain.  Exercises or physical therapy to strengthen and stretch the tendon.  A brace or splint.  Surgery (rare).  Follow these instructions at home:  If you have a splint or brace:  Wear the splint or brace as told by your health care provider. Remove it only as told by your health care provider.  Loosen the splint or brace if your fingers or toes tingle, become numb, or turn cold and blue.  Do not take baths, swim, or use a hot tub until your health care provider approves. Ask your health care provider if you can take showers. You may only be allowed to take sponge baths for bathing.  Do not let your splint or brace get wet if it is not waterproof. ? If your splint or brace is not waterproof, cover it with a watertight plastic bag when you take a bath or a shower.  Keep the splint or brace clean. Managing pain, stiffness, and swelling  If directed, apply ice to the affected area. ? Put ice in a plastic bag. ? Place a towel between your skin and the bag. ? Leave the ice on for 20 minutes, 2-3 times a day.  If directed, apply heat to the affected area as often as told by your health care provider. Use the heat source that your health care provider recommends, such as a moist heat pack or a heating pad. ? Place a towel between your skin and the heat source. ? Leave   the heat on for 20-30 minutes. ? Remove the heat if your skin turns bright red. This is especially important if you are unable to feel pain, heat, or cold. You may have a greater risk of getting burned.  Move the fingers or toes of the affected limb often, if this applies. This can help to prevent stiffness and lessen swelling.  If directed, elevate the affected area above the level of your heart while you are sitting or lying down. Driving  Do not drive or operate heavy machinery while taking prescription pain  medicine.  Ask your health care provider when it is safe to drive if you have a splint or brace on any part of your arm or leg. Activity  Return to your normal activities as told by your health care provider. Ask your health care provider what activities are safe for you.  Rest the affected area as told by your health care provider.  Avoid using the affected area while you are experiencing symptoms of tendinitis.  Do exercises as told by your health care provider. General instructions  If you have a splint, do not put pressure on any part of the splint until it is fully hardened. This may take several hours.  Wear an elastic bandage or compression wrap only as told by your health care provider.  Take over-the-counter and prescription medicines only as told by your health care provider.  Keep all follow-up visits as told by your health care provider. This is important. Contact a health care provider if:  Your symptoms do not improve.  You develop new, unexplained problems, such as numbness in your hands. This information is not intended to replace advice given to you by your health care provider. Make sure you discuss any questions you have with your health care provider. Document Released: 08/19/2000 Document Revised: 04/21/2016 Document Reviewed: 05/25/2015 Elsevier Interactive Patient Education  2018 [employer]  

## 2017-12-07 ENCOUNTER — Encounter: Payer: Self-pay | Admitting: Physician Assistant

## 2018-04-03 DIAGNOSIS — Z1231 Encounter for screening mammogram for malignant neoplasm of breast: Secondary | ICD-10-CM | POA: Diagnosis not present

## 2018-07-19 DIAGNOSIS — M62838 Other muscle spasm: Secondary | ICD-10-CM | POA: Diagnosis not present

## 2018-07-19 DIAGNOSIS — R51 Headache: Secondary | ICD-10-CM | POA: Diagnosis not present

## 2018-07-24 ENCOUNTER — Encounter: Payer: Self-pay | Admitting: Physician Assistant

## 2018-07-24 ENCOUNTER — Other Ambulatory Visit: Payer: Self-pay

## 2018-07-24 ENCOUNTER — Ambulatory Visit (INDEPENDENT_AMBULATORY_CARE_PROVIDER_SITE_OTHER): Payer: 59 | Admitting: Physician Assistant

## 2018-07-24 VITALS — BP 138/95 | HR 91 | Temp 99.0°F | Resp 18 | Ht 64.0 in | Wt 279.6 lb

## 2018-07-24 DIAGNOSIS — Z1389 Encounter for screening for other disorder: Secondary | ICD-10-CM

## 2018-07-24 DIAGNOSIS — Z23 Encounter for immunization: Secondary | ICD-10-CM | POA: Diagnosis not present

## 2018-07-24 DIAGNOSIS — Z1329 Encounter for screening for other suspected endocrine disorder: Secondary | ICD-10-CM

## 2018-07-24 DIAGNOSIS — E785 Hyperlipidemia, unspecified: Secondary | ICD-10-CM | POA: Diagnosis not present

## 2018-07-24 DIAGNOSIS — K219 Gastro-esophageal reflux disease without esophagitis: Secondary | ICD-10-CM

## 2018-07-24 DIAGNOSIS — Z130 Encounter for screening for diseases of the blood and blood-forming organs and certain disorders involving the immune mechanism: Secondary | ICD-10-CM

## 2018-07-24 DIAGNOSIS — Z8632 Personal history of gestational diabetes: Secondary | ICD-10-CM

## 2018-07-24 DIAGNOSIS — Z0000 Encounter for general adult medical examination without abnormal findings: Secondary | ICD-10-CM

## 2018-07-24 LAB — POCT URINALYSIS DIP (MANUAL ENTRY)
Bilirubin, UA: NEGATIVE
Blood, UA: NEGATIVE
Glucose, UA: NEGATIVE mg/dL
Ketones, POC UA: NEGATIVE mg/dL
LEUKOCYTES UA: NEGATIVE
NITRITE UA: NEGATIVE
PH UA: 6.5 (ref 5.0–8.0)
PROTEIN UA: NEGATIVE mg/dL
Spec Grav, UA: 1.025 (ref 1.010–1.025)
Urobilinogen, UA: 0.2 E.U./dL

## 2018-07-24 NOTE — Patient Instructions (Addendum)
 If you have lab work done today you will be contacted with your lab results within the next 2 weeks.  If you have not heard from us then please contact us. The fastest way to get your results is to register for My Chart.   Health Maintenance, Female Adopting a healthy lifestyle and getting preventive care can go a long way to promote health and wellness. Talk with your health care provider about what schedule of regular examinations is right for you. This is a good chance for you to check in with your provider about disease prevention and staying healthy. In between checkups, there are plenty of things you can do on your own. Experts have done a lot of research about which lifestyle changes and preventive measures are most likely to keep you healthy. Ask your health care provider for more information. Weight and diet Eat a healthy diet  Be sure to include plenty of vegetables, fruits, low-fat dairy products, and lean protein.  Do not eat a lot of foods high in solid fats, added sugars, or salt.  Get regular exercise. This is one of the most important things you can do for your health. ? Most adults should exercise for at least 150 minutes each week. The exercise should increase your heart rate and make you sweat (moderate-intensity exercise). ? Most adults should also do strengthening exercises at least twice a week. This is in addition to the moderate-intensity exercise.  Maintain a healthy weight  Body mass index (BMI) is a measurement that can be used to identify possible weight problems. It estimates body fat based on height and weight. Your health care provider can help determine your BMI and help you achieve or maintain a healthy weight.  For females 20 years of age and older: ? A BMI below 18.5 is considered underweight. ? A BMI of 18.5 to 24.9 is normal. ? A BMI of 25 to 29.9 is considered overweight. ? A BMI of 30 and above is considered obese.  Watch levels of cholesterol and  blood lipids  You should start having your blood tested for lipids and cholesterol at 45 years of age, then have this test every 5 years.  You may need to have your cholesterol levels checked more often if: ? Your lipid or cholesterol levels are high. ? You are older than 45 years of age. ? You are at high risk for heart disease.  Cancer screening Lung Cancer  Lung cancer screening is recommended for adults 55-80 years old who are at high risk for lung cancer because of a history of smoking.  A yearly low-dose CT scan of the lungs is recommended for people who: ? Currently smoke. ? Have quit within the past 15 years. ? Have at least a 30-pack-year history of smoking. A pack year is smoking an average of one pack of cigarettes a day for 1 year.  Yearly screening should continue until it has been 15 years since you quit.  Yearly screening should stop if you develop a health problem that would prevent you from having lung cancer treatment.  Breast Cancer  Practice breast self-awareness. This means understanding how your breasts normally appear and feel.  It also means doing regular breast self-exams. Let your health care provider know about any changes, no matter how small.  If you are in your 20s or 30s, you should have a clinical breast exam (CBE) by a health care provider every 1-3 years as part of a regular health   exam.  If you are 40 or older, have a CBE every year. Also consider having a breast X-ray (mammogram) every year.  If you have a family history of breast cancer, talk to your health care provider about genetic screening.  If you are at high risk for breast cancer, talk to your health care provider about having an MRI and a mammogram every year.  Breast cancer gene (BRCA) assessment is recommended for women who have family members with BRCA-related cancers. BRCA-related cancers include: ? Breast. ? Ovarian. ? Tubal. ? Peritoneal cancers.  Results of the assessment  will determine the need for genetic counseling and BRCA1 and BRCA2 testing.  Cervical Cancer Your health care provider may recommend that you be screened regularly for cancer of the pelvic organs (ovaries, uterus, and vagina). This screening involves a pelvic examination, including checking for microscopic changes to the surface of your cervix (Pap test). You may be encouraged to have this screening done every 3 years, beginning at age 21.  For women ages 30-65, health care providers may recommend pelvic exams and Pap testing every 3 years, or they may recommend the Pap and pelvic exam, combined with testing for human papilloma virus (HPV), every 5 years. Some types of HPV increase your risk of cervical cancer. Testing for HPV may also be done on women of any age with unclear Pap test results.  Other health care providers may not recommend any screening for nonpregnant women who are considered low risk for pelvic cancer and who do not have symptoms. Ask your health care provider if a screening pelvic exam is right for you.  If you have had past treatment for cervical cancer or a condition that could lead to cancer, you need Pap tests and screening for cancer for at least 20 years after your treatment. If Pap tests have been discontinued, your risk factors (such as having a new sexual partner) need to be reassessed to determine if screening should resume. Some women have medical problems that increase the chance of getting cervical cancer. In these cases, your health care provider may recommend more frequent screening and Pap tests.  Colorectal Cancer  This type of cancer can be detected and often prevented.  Routine colorectal cancer screening usually begins at 45 years of age and continues through 45 years of age.  Your health care provider may recommend screening at an earlier age if you have risk factors for colon cancer.  Your health care provider may also recommend using home test kits to  check for hidden blood in the stool.  A small camera at the end of a tube can be used to examine your colon directly (sigmoidoscopy or colonoscopy). This is done to check for the earliest forms of colorectal cancer.  Routine screening usually begins at age 50.  Direct examination of the colon should be repeated every 5-10 years through 45 years of age. However, you may need to be screened more often if early forms of precancerous polyps or small growths are found.  Skin Cancer  Check your skin from head to toe regularly.  Tell your health care provider about any new moles or changes in moles, especially if there is a change in a mole's shape or color.  Also tell your health care provider if you have a mole that is larger than the size of a pencil eraser.  Always use sunscreen. Apply sunscreen liberally and repeatedly throughout the day.  Protect yourself by wearing long sleeves, pants, a wide-brimmed   sunglasses whenever you are outside.  Heart disease, diabetes, and high blood pressure  High blood pressure causes heart disease and increases the risk of stroke. High blood pressure is more likely to develop in: ? People who have blood pressure in the high end of the normal range (130-139/85-89 mm Hg). ? People who are overweight or obese. ? People who are African American.  If you are 67-53 years of age, have your blood pressure checked every 3-5 years. If you are 45 years of age or older, have your blood pressure checked every year. You should have your blood pressure measured twice-once when you are at a hospital or clinic, and once when you are not at a hospital or clinic. Record the average of the two measurements. To check your blood pressure when you are not at a hospital or clinic, you can use: ? An automated blood pressure machine at a pharmacy. ? A home blood pressure monitor.  If you are between 47 years and 65 years old, ask your health care provider if you should  take aspirin to prevent strokes.  Have regular diabetes screenings. This involves taking a blood sample to check your fasting blood sugar level. ? If you are at a normal weight and have a low risk for diabetes, have this test once every three years after 45 years of age. ? If you are overweight and have a high risk for diabetes, consider being tested at a younger age or more often. Preventing infection Hepatitis B  If you have a higher risk for hepatitis B, you should be screened for this virus. You are considered at high risk for hepatitis B if: ? You were born in a country where hepatitis B is common. Ask your health care provider which countries are considered high risk. ? Your parents were born in a high-risk country, and you have not been immunized against hepatitis B (hepatitis B vaccine). ? You have HIV or AIDS. ? You use needles to inject street drugs. ? You live with someone who has hepatitis B. ? You have had sex with someone who has hepatitis B. ? You get hemodialysis treatment. ? You take certain medicines for conditions, including cancer, organ transplantation, and autoimmune conditions.  Hepatitis C  Blood testing is recommended for: ? Everyone born from [birth year] through 1963. ? Anyone with known risk factors for hepatitis C.  Sexually transmitted infections (STIs)  You should be screened for sexually transmitted infections (STIs) including gonorrhea and chlamydia if: ? You are sexually active and are younger than 45 years of age. ? You are older than 45 years of age and your health care provider tells you that you are at risk for this type of infection. ? Your sexual activity has changed since you were last screened and you are at an increased risk for chlamydia or gonorrhea. Ask your health care provider if you are at risk.  If you do not have HIV, but are at risk, it may be recommended that you take a prescription medicine daily to prevent HIV infection. This is called  pre-exposure prophylaxis (PrEP). You are considered at risk if: ? You are sexually active and do not regularly use condoms or know the HIV status of your partner(s). ? You take drugs by injection. ? You are sexually active with a partner who has HIV.  Talk with your health care provider about whether you are at high risk of being infected with HIV. If you choose to begin PrEP, you  PrEP, you should first be tested for HIV. You should then be tested every 3 months for as long as you are taking PrEP. Pregnancy  If you are premenopausal and you may become pregnant, ask your health care provider about preconception counseling.  If you may become pregnant, take 400 to 800 micrograms (mcg) of folic acid every day.  If you want to prevent pregnancy, talk to your health care provider about birth control (contraception). Osteoporosis and menopause  Osteoporosis is a disease in which the bones lose minerals and strength with aging. This can result in serious bone fractures. Your risk for osteoporosis can be identified using a bone density scan.  If you are 65 years of age or older, or if you are at risk for osteoporosis and fractures, ask your health care provider if you should be screened.  Ask your health care provider whether you should take a calcium or vitamin D supplement to lower your risk for osteoporosis.  Menopause may have certain physical symptoms and risks.  Hormone replacement therapy may reduce some of these symptoms and risks. Talk to your health care provider about whether hormone replacement therapy is right for you. Follow these instructions at home:  Schedule regular health, dental, and eye exams.  Stay current with your immunizations.  Do not use any tobacco products including cigarettes, chewing tobacco, or electronic cigarettes.  If you are pregnant, do not drink alcohol.  If you are breastfeeding, limit how much and how often you drink alcohol.  Limit alcohol intake to no more  than 1 drink per day for nonpregnant women. One drink equals 12 ounces of beer, 5 ounces of wine, or 1 ounces of hard liquor.  Do not use street drugs.  Do not share needles.  Ask your health care provider for help if you need support or information about quitting drugs.  Tell your health care provider if you often feel depressed.  Tell your health care provider if you have ever been abused or do not feel safe at home. This information is not intended to replace advice given to you by your health care provider. Make sure you discuss any questions you have with your health care provider. Document Released: 03/07/2011 Document Revised: 01/28/2016 Document Reviewed: 05/26/2015 Elsevier Interactive Patient Education  2018 [employer]vier Inc.   IF you received an x-ray today, you will receive an invoice from [facility]. Please contact [facility] at [phone] with questions or concerns regarding your invoice.   IF you received labwork today, you will receive an invoice from [facility]. Please contact [facility] at [phone] with questions or concerns regarding your invoice.   Our billing staff will not be able to assist you with questions regarding bills from these companies.  You will be contacted with the lab results as soon as they are available. The fastest way to get your results is to activate your My Chart account. Instructions are located on the last page of this paperwork. If you have not heard from us regarding the results in 2 weeks, please contact this office.     

## 2018-07-24 NOTE — Progress Notes (Signed)
Carol Parks  MRN: [id number] DOB: 02/20/1971  Subjective:  Pt is a 47 y.o. female who presents for annual physical exam. She is fasting today.    Primary Preventative Screenings: Cervical Cancer: 2017, normal Family Planning: 3 children, no plan to have more, tubal ligation 2015 STI screening: No concern for STDs, sexually active with monogamous husband.  Breast Cancer: 03/2018, normal. FH of breast cancer at age 23 Colorectal Cancer: No FH of colon cancer.  Weight/Blood sugar/Diet/Exercise: OTC/Vit/Supp/Herbal: multivitamin Dentist/Optho: dentists every 6 months, brushes once daily, flosses daily; eye exam annually.  Immunizations: Tetanus: >10 years ago Flu: Last year  GERD: takes omeprazole '20mg'$  daily. Had endoscopy last year, has hiatal hernia. Was told she will likely be on it forever.   Seasonal allergies: Takes flonase and zyrtec daily.  Patient Active Problem List   Diagnosis Date Noted  . Gastroesophageal reflux disease 05/13/2017  . Latex allergy 09/03/2013  . Hx of reduction mammoplasty 09/03/2013    Current Outpatient Medications on File Prior to Visit  Medication Sig Dispense Refill  . cetirizine (ZYRTEC) 10 MG chewable tablet Chew 10 mg by mouth daily.    . fluticasone (FLONASE) 50 MCG/ACT nasal spray Place 2 sprays into both nostrils daily. 16 g 0  . Omeprazole 20 MG TBEC Take 1 tablet (20 mg total) by mouth daily before breakfast. For six weeks 30 each 1   No current facility-administered medications on file prior to visit.     Allergies  Allergen Reactions  . Penicillins Other (See Comments)    Get yeast infection  . Latex Rash    Social History   Socioeconomic History  . Marital status: Married    Spouse name: Not on file  . Number of children: Not on file  . Years of education: Not on file  . Highest education level: Not on file  Occupational History  . Not on file  Social Needs  . Financial resource strain: Not hard at all  . Food  insecurity:    Worry: Never true    Inability: Never true  . Transportation needs:    Medical: No    Non-medical: No  Tobacco Use  . Smoking status: Never Smoker  . Smokeless tobacco: Never Used  Substance and Sexual Activity  . Alcohol use: No    Alcohol/week: 0.0 standard drinks  . Drug use: No  . Sexual activity: Yes    Birth control/protection: None  Lifestyle  . Physical activity:    Days per week: 0 days    Minutes per session: 0 min  . Stress: Not at all  Relationships  . Social connections:    Talks on phone: Not on file    Gets together: Not on file    Attends religious service: Not on file    Active member of club or organization: Not on file    Attends meetings of clubs or organizations: Not on file    Relationship status: Not on file  Other Topics Concern  . Not on file  Social History Narrative  . Not on file    Past Surgical History:  Procedure Laterality Date  . BREAST REDUCTION SURGERY Bilateral   . BREAST SURGERY    . TUBAL LIGATION Bilateral 09/05/2013   Procedure: POST PARTUM TUBAL LIGATION;  Surgeon: Alwyn Pea, MD;  Location: [facility];  Service: Gynecology;  Laterality: Bilateral;    Family History  Problem Relation Age of Onset  . Hypertension Mother   . Cancer Mother   .  Heart attack Father   . Aneurysm Father   . Heart attack Maternal Grandmother   . Heart attack Paternal Grandmother   . Heart attack Paternal Grandfather   . Asthma Daughter   . Mental retardation Paternal Aunt     Review of Systems  Constitutional: Negative for activity change, appetite change, chills, diaphoresis, fatigue, fever and unexpected weight change.  HENT: Negative for congestion, dental problem, drooling, ear discharge, ear pain, facial swelling, hearing loss, mouth sores, nosebleeds, postnasal drip, rhinorrhea, sinus pressure, sinus pain, sneezing, sore throat, tinnitus, trouble swallowing and voice change.   Eyes: Negative for photophobia, pain,  discharge, redness, itching and visual disturbance.  Respiratory: Negative for apnea, cough, choking, chest tightness, shortness of breath, wheezing and stridor.   Cardiovascular: Negative for chest pain, palpitations and leg swelling.  Gastrointestinal: Negative for abdominal distention, abdominal pain, anal bleeding, blood in stool, constipation, diarrhea, nausea, rectal pain and vomiting.  Endocrine: Negative for cold intolerance, heat intolerance, polydipsia, polyphagia and polyuria.  Genitourinary: Negative for decreased urine volume, difficulty urinating, dyspareunia, dysuria, enuresis, flank pain, frequency, genital sores, hematuria, menstrual problem, pelvic pain, urgency, vaginal bleeding, vaginal discharge and vaginal pain.  Musculoskeletal: Negative for arthralgias, back pain, gait problem, joint swelling, myalgias, neck pain and neck stiffness.  Skin: Negative for color change, pallor, rash and wound.  Allergic/Immunologic: Negative for environmental allergies, food allergies and immunocompromised state.  Neurological: Negative for dizziness, tremors, seizures, syncope, facial asymmetry, speech difficulty, weakness, light-headedness, numbness and headaches.  Hematological: Negative for adenopathy. Does not bruise/bleed easily.  Psychiatric/Behavioral: Negative for agitation, behavioral problems, confusion, decreased concentration, dysphoric mood, hallucinations, self-injury, sleep disturbance and suicidal ideas. The patient is not nervous/anxious and is not hyperactive.     Objective:  BP (!) 138/95   Pulse 91   Temp 99 F (37.2 C) (Oral)   Resp 18   Ht '5\' 4"'$  (1.626 m)   Wt 279 lb 9.6 oz (126.8 kg)   LMP 07/14/2018   SpO2 99%   BMI 47.99 kg/m   Physical Exam  Constitutional: She is oriented to person, place, and time. She appears well-developed and well-nourished. No distress.  HENT:  Head: Normocephalic and atraumatic.  Right Ear: Hearing, tympanic membrane, external ear  and ear canal normal.  Left Ear: Hearing, tympanic membrane, external ear and ear canal normal.  Nose: Nose normal.  Mouth/Throat: Uvula is midline, oropharynx is clear and moist and mucous membranes are normal. No oropharyngeal exudate.  Eyes: Pupils are equal, round, and reactive to light. Conjunctivae, EOM and lids are normal. No scleral icterus.  Neck: Trachea normal and normal range of motion. No thyroid mass and no thyromegaly present.  Cardiovascular: Normal rate, regular rhythm, normal heart sounds and intact distal pulses.  Pulmonary/Chest: Effort normal and breath sounds normal. Right breast exhibits no inverted nipple, no mass, no nipple discharge, no skin change and no tenderness. Left breast exhibits no inverted nipple, no mass, no nipple discharge, no skin change and no tenderness.  CMA chaperone present for breast exam.   Abdominal: Soft. Normal appearance and bowel sounds are normal. There is no tenderness.  Lymphadenopathy:       Head (right side): No tonsillar, no preauricular, no posterior auricular and no occipital adenopathy present.       Head (left side): No tonsillar, no preauricular, no posterior auricular and no occipital adenopathy present.    She has no cervical adenopathy.       Right: No supraclavicular adenopathy present.  Left: No supraclavicular adenopathy present.  Neurological: She is alert and oriented to person, place, and time. She has normal strength and normal reflexes.  Skin: Skin is warm and dry.    Visual Acuity Screening   Right eye Left eye Both eyes  Without correction: '20/20 20/20 20/20 '$  With correction:      Results for orders placed or performed in visit on 07/24/18 (from the past 24 hour(s))  POCT urinalysis dipstick     Status: None   Collection Time: 07/24/18  8:43 AM  Result Value Ref Range   Color, UA yellow yellow   Clarity, UA clear clear   Glucose, UA negative negative mg/dL   Bilirubin, UA negative negative   Ketones, POC  UA negative negative mg/dL   Spec Grav, UA 1.025 1.010 - 1.025   Blood, UA negative negative   pH, UA 6.5 5.0 - 8.0   Protein Ur, POC negative negative mg/dL   Urobilinogen, UA 0.2 0.2 or 1.0 E.U./dL   Nitrite, UA Negative Negative   Leukocytes, UA Negative Negative     Assessment and Plan :  Discussed healthy lifestyle, diet, exercise, preventative care, vaccinations, and addressed patient's concerns. Plan for follow up in one year. Otherwise, plan for specific conditions below.  1. Annual physical exam Await lab results. Declines STD testing.   2. Need for influenza vaccination - Flu Vaccine QUAD 36+ mos IM  3. History of gestational diabetes - Hemoglobin A1c  4. Dyslipidemia - CMP14+EGFR - Lipid panel - TSH  5. Screening for deficiency anemia - CBC with Differential/Platelet  6. Gastroesophageal reflux disease, esophagitis presence not specified - CBC with Differential/Platelet  7. Screening for thyroid disorder - TSH  8. Screening for hematuria or proteinuria - POCT urinalysis dipstick    Tenna Delaine, PA-C  [facility] 07/24/2018 9:38 AM

## 2018-07-25 LAB — CMP14+EGFR
ALT: 12 IU/L (ref 0–32)
AST: 15 IU/L (ref 0–40)
Albumin/Globulin Ratio: 1.4 (ref 1.2–2.2)
Albumin: 4 g/dL (ref 3.5–5.5)
Alkaline Phosphatase: 100 IU/L (ref 39–117)
BUN / CREAT RATIO: 20 (ref 9–23)
BUN: 14 mg/dL (ref 6–24)
Bilirubin Total: 0.3 mg/dL (ref 0.0–1.2)
CALCIUM: 8.5 mg/dL — AB (ref 8.7–10.2)
CO2: 20 mmol/L (ref 20–29)
CREATININE: 0.69 mg/dL (ref 0.57–1.00)
Chloride: 106 mmol/L (ref 96–106)
GFR calc Af Amer: 122 mL/min/{1.73_m2} (ref >59)
GFR, EST NON AFRICAN AMERICAN: 105 mL/min/{1.73_m2} (ref >59)
GLOBULIN, TOTAL: 2.8 g/dL (ref 1.5–4.5)
GLUCOSE: 93 mg/dL (ref 65–99)
Potassium: 5.1 mmol/L (ref 3.5–5.2)
SODIUM: 140 mmol/L (ref 134–144)
Total Protein: 6.8 g/dL (ref 6.0–8.5)

## 2018-07-25 LAB — CBC WITH DIFFERENTIAL/PLATELET
Basophils Absolute: 0.1 10*3/uL (ref 0.0–0.2)
Basos: 1 %
EOS (ABSOLUTE): 0.3 10*3/uL (ref 0.0–0.4)
EOS: 4 %
HEMATOCRIT: 35.7 % (ref 34.0–46.6)
Hemoglobin: 10.7 g/dL — ABNORMAL LOW (ref 11.1–15.9)
Immature Grans (Abs): 0 10*3/uL (ref 0.0–0.1)
Immature Granulocytes: 0 %
Lymphocytes Absolute: 2.3 10*3/uL (ref 0.7–3.1)
Lymphs: 27 %
MCH: 22.3 pg — ABNORMAL LOW (ref 26.6–33.0)
MCHC: 30 g/dL — ABNORMAL LOW (ref 31.5–35.7)
MCV: 75 fL — ABNORMAL LOW (ref 79–97)
MONOCYTES: 7 %
Monocytes Absolute: 0.6 10*3/uL (ref 0.1–0.9)
NEUTROS PCT: 61 %
Neutrophils Absolute: 5.3 10*3/uL (ref 1.4–7.0)
Platelets: 464 10*3/uL — ABNORMAL HIGH (ref 150–450)
RBC: 4.79 x10E6/uL (ref 3.77–5.28)
RDW: 16.6 % — ABNORMAL HIGH (ref 12.3–15.4)
WBC: 8.7 10*3/uL (ref 3.4–10.8)

## 2018-07-25 LAB — LIPID PANEL
CHOL/HDL RATIO: 3.8 ratio (ref 0.0–4.4)
Cholesterol, Total: 176 mg/dL (ref 100–199)
HDL: 46 mg/dL (ref >39)
LDL CALC: 117 mg/dL — AB (ref 0–99)
TRIGLYCERIDES: 66 mg/dL (ref 0–149)
VLDL Cholesterol Cal: 13 mg/dL (ref 5–40)

## 2018-07-25 LAB — HEMOGLOBIN A1C
ESTIMATED AVERAGE GLUCOSE: 114 mg/dL
Hgb A1c MFr Bld: 5.6 % (ref 4.8–5.6)

## 2018-07-25 LAB — TSH: TSH: 1.46 u[IU]/mL (ref 0.450–4.500)

## 2018-07-27 DIAGNOSIS — M542 Cervicalgia: Secondary | ICD-10-CM | POA: Diagnosis not present

## 2018-08-01 ENCOUNTER — Other Ambulatory Visit: Payer: Self-pay

## 2018-08-01 DIAGNOSIS — Z130 Encounter for screening for diseases of the blood and blood-forming organs and certain disorders involving the immune mechanism: Secondary | ICD-10-CM

## 2018-08-21 ENCOUNTER — Ambulatory Visit (INDEPENDENT_AMBULATORY_CARE_PROVIDER_SITE_OTHER): Payer: 59 | Admitting: Family Medicine

## 2018-08-21 DIAGNOSIS — Z130 Encounter for screening for diseases of the blood and blood-forming organs and certain disorders involving the immune mechanism: Secondary | ICD-10-CM | POA: Diagnosis not present

## 2018-08-21 NOTE — Progress Notes (Signed)
Labs only visit

## 2018-08-22 LAB — IRON,TIBC AND FERRITIN PANEL
Ferritin: 10 ng/mL — ABNORMAL LOW (ref 15–150)
Iron Saturation: 6 % — CL (ref 15–55)
Iron: 22 ug/dL — ABNORMAL LOW (ref 27–159)
Total Iron Binding Capacity: 360 ug/dL (ref 250–450)
UIBC: 338 ug/dL (ref 131–425)

## 2018-09-04 MED ORDER — FERROUS GLUCONATE 324 (38 FE) MG PO TABS: 324.0000 mg | ORAL_TABLET | Freq: Every day | ORAL | 0 refills | Status: DC

## 2018-09-04 NOTE — Addendum Note (Signed)
Addended by: Rutherford Guys on: 09/04/2018 01:41 PM   Modules accepted: Orders

## 2018-09-05 ENCOUNTER — Encounter: Payer: Self-pay | Admitting: Family Medicine

## 2018-10-19 ENCOUNTER — Encounter

## 2018-10-19 ENCOUNTER — Encounter: Payer: Self-pay | Admitting: Family Medicine

## 2018-10-19 ENCOUNTER — Other Ambulatory Visit: Payer: Self-pay

## 2018-10-19 ENCOUNTER — Ambulatory Visit: Payer: 59 | Admitting: Family Medicine

## 2018-10-19 VITALS — BP 128/82 | HR 90 | Temp 98.0°F | Resp 16 | Ht 64.0 in | Wt 280.0 lb

## 2018-10-19 DIAGNOSIS — K219 Gastro-esophageal reflux disease without esophagitis: Secondary | ICD-10-CM | POA: Diagnosis not present

## 2018-10-19 DIAGNOSIS — D500 Iron deficiency anemia secondary to blood loss (chronic): Secondary | ICD-10-CM | POA: Diagnosis not present

## 2018-10-19 DIAGNOSIS — J339 Nasal polyp, unspecified: Secondary | ICD-10-CM

## 2018-10-19 DIAGNOSIS — R0683 Snoring: Secondary | ICD-10-CM | POA: Diagnosis not present

## 2018-10-19 DIAGNOSIS — K449 Diaphragmatic hernia without obstruction or gangrene: Secondary | ICD-10-CM

## 2018-10-19 DIAGNOSIS — Z862 Personal history of diseases of the blood and blood-forming organs and certain disorders involving the immune mechanism: Secondary | ICD-10-CM | POA: Diagnosis not present

## 2018-10-19 NOTE — Progress Notes (Signed)
2/14/202010:02 AM  Carol Parks 1971-04-14, 48 y.o. female [id number]  Chief Complaint  Patient presents with  . Deficiency Anemia    2 month follow-up   . Snoring    pt states she has had this issue for years and would like to talk about some options     HPI:   Patient is a 48 y.o. female with past medical history significant for iron deficiency anemia, seasonal allergies, GERD who presents today for followup  Patient reports she used to donate blood every 8 weeks and last year was only able to donate twice Menses are regular, last 3 days, not heavy Has gerd, on omeprazole, mostly well controlled, is she has  epigastric pain triggered by certain foods, later after eating, has had egd in 2018, has a hiatal hernia otherwise unremarkable No black tarry stools or right red blood in stools Last donation was may 2019, she is O negative She is a meat eater Has been on iron supplement for about a month has not had any issues with constipation No fhx colon cancer Famotidine did not work at all  Snoring, long standing, very loud, getting worse Denies reported witnessed apnea Does not wake up refreshed Denies any chronic meds She falls asleep easily during the day Has been told she has many nasal polyps She takes allergy meds every day: zytrec and flonase   Lab Results  Component Value Date   WBC 8.7 07/24/2018   HGB 10.7 (L) 07/24/2018   HCT 35.7 07/24/2018   MCV 75 (L) 07/24/2018   PLT 464 (H) 07/24/2018   Lab Results  Component Value Date   FERRITIN 10 (L) 08/21/2018    Fall Risk  10/19/2018 07/24/2018 08/02/2017 05/24/2017 11/21/2016  Falls in the past year? 0 0 No No No  Injury with Fall? 0 - - - -     Depression screen [facility] 2/9 10/19/2018 07/24/2018 08/02/2017  Decreased Interest 0 0 0  Down, Depressed, Hopeless 0 0 0  PHQ - 2 Score 0 0 0    Allergies  Allergen Reactions  . Penicillins Other (See Comments)    Get yeast infection  . Latex Rash    Prior to  Admission medications   Medication Sig Start Date End Date Taking? Authorizing Provider  cetirizine (ZYRTEC) 10 MG chewable tablet Chew 10 mg by mouth daily.   Yes [provider]  ferrous gluconate (FERGON) 324 MG tablet Take 1 tablet (324 mg total) by mouth daily with breakfast. 09/04/18  Yes Rutherford Guys, MD  fluticasone [facility]) 50 MCG/ACT nasal spray Place 2 sprays into both nostrils daily. 06/29/16  Yes Harrison Mons, PA  Omeprazole 20 MG TBEC Take 1 tablet (20 mg total) by mouth daily before breakfast. For six weeks 11/21/16  Yes Rosemarie Ax, MD    Past Medical History:  Diagnosis Date  . Allergy   . GERD (gastroesophageal reflux disease)   . Medical history non-contributory     Past Surgical History:  Procedure Laterality Date  . BREAST REDUCTION SURGERY Bilateral   . BREAST SURGERY    . TUBAL LIGATION Bilateral 09/05/2013   Procedure: POST PARTUM TUBAL LIGATION;  Surgeon: Alwyn Pea, MD;  Location: [facility];  Service: Gynecology;  Laterality: Bilateral;    Social History   Tobacco Use  . Smoking status: Never Smoker  . Smokeless tobacco: Never Used  Substance Use Topics  . Alcohol use: No    Alcohol/week: 0.0 standard drinks    Family  History  Problem Relation Age of Onset  . Hypertension Mother   . Cancer Mother   . Heart attack Father   . Aneurysm Father   . Heart attack Maternal Grandmother   . Heart attack Paternal Grandmother   . Heart attack Paternal Grandfather   . Asthma Daughter   . Mental retardation Paternal Aunt     Review of Systems  Constitutional: Negative for chills and fever.  Respiratory: Negative for cough and shortness of breath.   Cardiovascular: Negative for chest pain, palpitations and leg swelling.  Gastrointestinal: Negative for abdominal pain, nausea and vomiting.  Neurological: Negative for dizziness.   Per hpi  OBJECTIVE:  Blood pressure 128/82, pulse 90, temperature 98 F (36.7 C), temperature source  Oral, resp. rate 16, height 5\' 4"  (1.626 m), weight 280 lb (127 kg), SpO2 98 %. Body mass index is 48.06 kg/m.   Physical Exam Vitals signs and nursing note reviewed.  Constitutional:      Appearance: She is well-developed.  HENT:     Head: Normocephalic and atraumatic.     Mouth/Throat:     Pharynx: No oropharyngeal exudate.  Eyes:     General: No scleral icterus.    Conjunctiva/sclera: Conjunctivae normal.     Pupils: Pupils are equal, round, and reactive to light.  Neck:     Musculoskeletal: Neck supple.  Cardiovascular:     Rate and Rhythm: Normal rate and regular rhythm.     Heart sounds: Normal heart sounds. No murmur. No friction rub. No gallop.   Pulmonary:     Effort: Pulmonary effort is normal.     Breath sounds: Normal breath sounds. No wheezing or rales.  Skin:    General: Skin is warm and dry.  Neurological:     Mental Status: She is alert and oriented to person, place, and time.     ASSESSMENT and PLAN  1. Iron deficiency anemia due to chronic blood loss No obvious cause other than years of frequent donation of blood. Cont with iron supplements. Has not donated in over 6 months - Iron, TIBC and Ferritin Panel - CBC  2. Hiatal hernia with GERD Cont with PPI and LFM  3. Snoring - Ambulatory referral to Sleep Studies  4. Nasal polyp Increase flonase to max dose - Ambulatory referral to ENT    Return in about 3 months (around 01/17/2019).    Rutherford Guys, MD [facility] [place], [place] [address] Ph.  [phone] Fax [phone]

## 2018-10-19 NOTE — Patient Instructions (Signed)
° ° ° °  If you have lab work done today you will be contacted with your lab results within the next 2 weeks.  If you have not heard from us then please contact us. The fastest way to get your results is to register for My Chart. ° ° °IF you received an x-ray today, you will receive an invoice from [facility]. Please contact [facility] at [phone] with questions or concerns regarding your invoice.  ° °IF you received labwork today, you will receive an invoice from [facility]. Please contact [facility] at [phone] with questions or concerns regarding your invoice.  ° °Our billing staff will not be able to assist you with questions regarding bills from these companies. ° °You will be contacted with the lab results as soon as they are available. The fastest way to get your results is to activate your My Chart account. Instructions are located on the last page of this paperwork. If you have not heard from us regarding the results in 2 weeks, please contact this office. °  ° ° ° °

## 2018-10-20 LAB — CBC
Hematocrit: 37.9 % (ref 34.0–46.6)
Hemoglobin: 11.8 g/dL (ref 11.1–15.9)
MCH: 24.7 pg — ABNORMAL LOW (ref 26.6–33.0)
MCHC: 31.1 g/dL — ABNORMAL LOW (ref 31.5–35.7)
MCV: 80 fL (ref 79–97)
Platelets: 411 10*3/uL (ref 150–450)
RBC: 4.77 x10E6/uL (ref 3.77–5.28)
RDW: 19.8 % — ABNORMAL HIGH (ref 11.7–15.4)
WBC: 8.8 10*3/uL (ref 3.4–10.8)

## 2018-10-20 LAB — IRON,TIBC AND FERRITIN PANEL
Ferritin: 17 ng/mL (ref 15–150)
Iron Saturation: 36 % (ref 15–55)
Iron: 128 ug/dL (ref 27–159)
Total Iron Binding Capacity: 356 ug/dL (ref 250–450)
UIBC: 228 ug/dL (ref 131–425)

## 2018-11-07 ENCOUNTER — Telehealth: Payer: Self-pay | Admitting: Family Medicine

## 2018-11-07 DIAGNOSIS — J310 Chronic rhinitis: Secondary | ICD-10-CM | POA: Diagnosis not present

## 2018-11-07 DIAGNOSIS — G4733 Obstructive sleep apnea (adult) (pediatric): Secondary | ICD-10-CM | POA: Diagnosis not present

## 2018-11-07 DIAGNOSIS — R0683 Snoring: Secondary | ICD-10-CM

## 2018-11-07 NOTE — Telephone Encounter (Signed)
Left VM - stating I am making referral to [facility] for sleep eval I do not want to order just a sleep study as I do not interpret sleep studies nor manage CPAP.  New referral made

## 2018-11-07 NOTE — Telephone Encounter (Signed)
Please Adv  She has a [facility] [address] from [facility]. Does she want to change providers?

## 2018-11-07 NOTE — Telephone Encounter (Signed)
Please advise 

## 2018-11-07 NOTE — Telephone Encounter (Signed)
Copied from [place] [id number]. Topic: General - Other >> Nov 07, 2018 11:32 AM Lennox Solders wrote: Reason for CRM: terry with [facility] said they need an order not a referral for this pt to have sleep study done

## 2018-11-08 DIAGNOSIS — J101 Influenza due to other identified influenza virus with other respiratory manifestations: Secondary | ICD-10-CM | POA: Diagnosis not present

## 2018-11-08 NOTE — Telephone Encounter (Signed)
Just made new referral to [facility]. Please process. thanks

## 2018-11-09 NOTE — Telephone Encounter (Signed)
Please advise 

## 2018-11-12 ENCOUNTER — Other Ambulatory Visit: Payer: Self-pay | Admitting: Otolaryngology

## 2018-11-12 DIAGNOSIS — J329 Chronic sinusitis, unspecified: Secondary | ICD-10-CM

## 2018-11-12 NOTE — Telephone Encounter (Signed)
Spoke to pt this Morning to follow-up about referral, she advise me that she has not heard anything yet so I will speak with Sherron today and give her a call back or they will give her a call to schedule.

## 2018-11-14 ENCOUNTER — Encounter: Payer: Self-pay | Admitting: Neurology

## 2018-11-14 ENCOUNTER — Other Ambulatory Visit: Payer: Self-pay

## 2018-11-14 ENCOUNTER — Ambulatory Visit (INDEPENDENT_AMBULATORY_CARE_PROVIDER_SITE_OTHER): Payer: 59 | Admitting: Neurology

## 2018-11-14 VITALS — BP 200/122 | HR 89 | Ht 64.0 in | Wt 279.0 lb

## 2018-11-14 DIAGNOSIS — R0683 Snoring: Secondary | ICD-10-CM | POA: Diagnosis not present

## 2018-11-14 DIAGNOSIS — G4719 Other hypersomnia: Secondary | ICD-10-CM

## 2018-11-14 DIAGNOSIS — R519 Headache, unspecified: Secondary | ICD-10-CM

## 2018-11-14 DIAGNOSIS — R51 Headache: Secondary | ICD-10-CM

## 2018-11-14 DIAGNOSIS — Z6841 Body Mass Index (BMI) 40.0 and over, adult: Secondary | ICD-10-CM

## 2018-11-14 NOTE — Patient Instructions (Signed)

## 2018-11-14 NOTE — Progress Notes (Signed)
Subjective:    Patient ID: Carol Parks is a 48 y.o. female.  HPI     Carol Age, MD, PhD [facility] [address] [place], [place] [address]  Dear Dr. Pamella Pert,  I saw your patient, Carol Parks, upon your kind request in my sleep clinic today for initial consultation of her sleep disorder, in particular, concern for underlying obstructive sleep apnea. The patient is unaccompanied today. As you know, Carol Parks is a 48 year old right-handed woman with an underlying medical history of allergies, reflux disease, nasal polyps, anemia and obesity, who reports snoring and excessive daytime somnolence. I reviewed your office note from 10/19/2018. She lives at home with her husband and 3 children, ages 39, 35 and 64. Time is generally late, she admits that she tends to stay up late. She is in bed between 11 and 11:30, rise time is 6:30. She has a history of allergies symptoms. She grinds her teeth and has been using a bite guard for years. Her weight has been fluctuating. At one point she was able to lose weight and she followed with weight management but no longer sees a weight management doctor. Her snoring is reportedly loud and disturbing to her husband. She denies any telltale restless leg symptoms. She is a restless sleeper. She has had some morning headaches. She has migrainous headaches infrequently. No formal migraine diagnosis from before however. She denies nocturia. She is not aware of any family history of OSA. She has recently seen ENT for her nasal congestion and concern for nasal polyps. She has a sinus CT pending soon. She is a nonsmoker, does not utilize alcohol and drinks caffeine occasionally in the form of soda, not daily. She works for [name]   Her Past Medical History Is Significant For: Past Medical History:  Diagnosis Date  . Allergy   . GERD (gastroesophageal reflux disease)   . Medical history non-contributory     Her  Past Surgical History Is Significant For: Past Surgical History:  Procedure Laterality Date  . BREAST REDUCTION SURGERY Bilateral   . BREAST SURGERY    . TUBAL LIGATION Bilateral 09/05/2013   Procedure: POST PARTUM TUBAL LIGATION;  Surgeon: Alwyn Pea, MD;  Location: [facility];  Service: Gynecology;  Laterality: Bilateral;    Her Family History Is Significant For: Family History  Problem Relation Parks of Onset  . Hypertension Mother   . Cancer Mother   . Heart attack Father   . Aneurysm Father   . Heart attack Maternal Grandmother   . Heart attack Paternal Grandmother   . Heart attack Paternal Grandfather   . Asthma Daughter   . Mental retardation Paternal Aunt     Her Social History Is Significant For: Social History   Socioeconomic History  . Marital status: Married    Spouse name: Not on file  . Number of children: Not on file  . Years of education: Not on file  . Highest education level: Not on file  Occupational History  . Not on file  Social Needs  . Financial resource strain: Not hard at all  . Food insecurity:    Worry: Never true    Inability: Never true  . Transportation needs:    Medical: No    Non-medical: No  Tobacco Use  . Smoking status: Never Smoker  . Smokeless tobacco: Never Used  Substance and Sexual Activity  . Alcohol use: No    Alcohol/week: 0.0 standard drinks  .  Drug use: No  . Sexual activity: Yes    Birth control/protection: None  Lifestyle  . Physical activity:    Days per week: 0 days    Minutes per session: 0 min  . Stress: Not at all  Relationships  . Social connections:    Talks on phone: Not on file    Gets together: Not on file    Attends religious service: Not on file    Active member of club or organization: Not on file    Attends meetings of clubs or organizations: Not on file    Relationship status: Not on file  Other Topics Concern  . Not on file  Social History Narrative   Lives at home with husband and children.      Her Allergies Are:  Allergies  Allergen Reactions  . Penicillins Other (See Comments)    Get yeast infection  . Latex Rash  :   Her Current Medications Are:  Outpatient Encounter Medications as of 11/14/2018  Medication Sig  . cetirizine (ZYRTEC) 10 MG chewable tablet Chew 10 mg by mouth daily.  . ferrous gluconate (FERGON) 324 MG tablet Take 1 tablet (324 mg total) by mouth daily with breakfast.  . fluticasone (FLONASE) 50 MCG/ACT nasal spray Place 2 sprays into both nostrils daily.  . Omeprazole 20 MG TBEC Take 1 tablet (20 mg total) by mouth daily before breakfast. For six weeks   No facility-administered encounter medications on file as of 11/14/2018.   :  Review of Systems:  Out of a complete 14 point review of systems, all are reviewed and negative with the exception of these symptoms as listed below:   Review of Systems  Neurological:       Pt presents today to discuss her sleep. Pt has never had a sleep study but does endorse snoring.  Epworth Sleepiness Scale 0= would never doze 1= slight chance of dozing 2= moderate chance of dozing 3= high chance of dozing  Sitting and reading: 3 Watching TV: 1 Sitting inactive in a public place (ex. Theater or meeting): 3 As a passenger in a car for an hour without a break: 3 Lying down to rest in the afternoon: 3 Sitting and talking to someone: 0 Sitting quietly after lunch (no alcohol): 2 In a car, while stopped in traffic: 0 Total: 15     Objective:  Neurological Exam  Physical Exam Physical Examination:   Vitals:   11/14/18 1417  BP: (!) 200/122  Pulse: 89    General Examination: The patient is a very pleasant 48 y.o. female in no acute distress. She appears well-developed and well-nourished and well groomed.   HEENT: Normocephalic, atraumatic, pupils are equal, round and reactive to light and accommodation. Extraocular tracking is good without limitation to gaze excursion or nystagmus noted. Normal  smooth pursuit is noted. Hearing is grossly intact. Face is symmetric with normal facial animation and normal facial sensation. Speech is clear with no dysarthria noted. There is no hypophonia. There is no lip, neck/head, jaw or voice tremor. Neck is supple with full range of passive and active motion. There are no carotid bruits on auscultation. Oropharynx exam reveals: mild mouth dryness, good dental hygiene and mild airway crowding, due to Smaller airway entry, tonsils are 1+, slightly redundant soft palate, Mallampati class II. Neck circumference is 15-3/8 inches. She has a minimal overbite. Nasal inspection reveals no significant mucosal bogginess.  Chest: Clear to auscultation without wheezing, rhonchi or crackles noted.  Heart: S1+S2+0,  regular and normal without murmurs, rubs or gallops noted.   Abdomen: Soft, non-tender and non-distended with normal bowel sounds appreciated on auscultation.  Extremities: There is no pitting edema in the distal lower extremities bilaterally.  Skin: Warm and dry without trophic changes noted.  Musculoskeletal: exam reveals no obvious joint deformities, tenderness or joint swelling or erythema.   Neurologically:  Mental status: The patient is awake, alert and oriented in all 4 spheres. Her immediate and remote memory, attention, language skills and fund of knowledge are appropriate. There is no evidence of aphasia, agnosia, apraxia or anomia. Speech is clear with normal prosody and enunciation. Thought process is linear. Mood is normal and affect is normal.  Cranial nerves II - XII are as described above under HEENT exam. In addition: shoulder shrug is normal with equal shoulder height noted. Motor exam: Normal bulk, strength and tone is noted. There is no drift, tremor or rebound. Romberg is negative. Fine motor skills and coordination: intact with normal finger taps, normal hand movements, normal rapid alternating patting, normal foot taps and normal foot  agility.  Cerebellar testing: No dysmetria or intention tremor on finger to nose testing. Heel to shin is unremarkable bilaterally. There is no truncal or gait ataxia.  Sensory exam: intact to light touch in the upper and lower extremities.  Gait, station and balance: She stands easily. No veering to one side is noted. No leaning to one side is noted. Posture is Parks-appropriate and stance is narrow based. Gait shows normal stride length and normal pace. No problems turning are noted. Tandem walk is unremarkable.   Assessment and Plan:  In summary, Carol Parks is a very pleasant 48 y.o.-year old female with an underlying medical history of allergies, reflux disease, nasal polyps, anemia and obesity, whose history and physical exam are concerning for obstructive sleep apnea (OSA). I had a long chat with the patient about my findings and the diagnosis of OSA, its prognosis and treatment options. We talked about medical treatments, surgical interventions and non-pharmacological approaches. I explained in particular the risks and ramifications of untreated moderate to severe OSA, especially with respect to developing cardiovascular disease down the Road, including congestive heart failure, difficult to treat hypertension, cardiac arrhythmias, or stroke. Even type 2 diabetes has, in part, been linked to untreated OSA. Symptoms of untreated OSA include daytime sleepiness, memory problems, mood irritability and mood disorder such as depression and anxiety, lack of energy, as well as recurrent headaches, especially morning headaches. We talked about trying to maintain a healthy lifestyle in general, as well as the importance of weight control. I encouraged the patient to eat healthy, exercise daily and keep well hydrated, to keep a scheduled bedtime and wake time routine, to not skip any meals and eat healthy snacks in between meals. I advised the patient not to drive when feeling sleepy. I recommended the following  at this time: sleep study with potential positive airway pressure titration. (We will score hypopneas at 4%).   I explained the sleep test procedure to the patient and also outlined possible surgical and non-surgical treatment options of OSA, including the use of a custom-made dental device (which would require a referral to a specialist dentist or oral surgeon), upper airway surgical options, such as pillar implants, radiofrequency surgery, tongue base surgery, and UPPP (which would involve a referral to an ENT surgeon). Rarely, jaw surgery such as mandibular advancement may be considered.  I also explained the CPAP treatment option to the patient, who indicated  that she would be willing to try CPAP if the need arises. I explained the importance of being compliant with PAP treatment, not only for insurance purposes but primarily to improve Her symptoms, and for the patient's long term health benefit, including to reduce Her cardiovascular risks. I answered all her questions today and the patient was in agreement. I plan to see her back back after the sleep study is completed and encouraged her to call with any interim questions, concerns, problems or updates.   Thank you very much for allowing me to participate in the care of this nice patient. If I can be of any further assistance to you please do not hesitate to call me at [phone].  Sincerely,   Carol Age, MD, PhD

## 2018-11-26 ENCOUNTER — Ambulatory Visit
Admission: RE | Admit: 2018-11-26 | Discharge: 2018-11-26 | Disposition: A | Discharge status: Home / Self Care | Payer: 59 | Source: Ambulatory Visit | Attending: Otolaryngology | Admitting: Otolaryngology

## 2018-11-26 DIAGNOSIS — J329 Chronic sinusitis, unspecified: Secondary | ICD-10-CM

## 2018-11-29 DIAGNOSIS — J300 Vasomotor rhinitis: Secondary | ICD-10-CM | POA: Diagnosis not present

## 2018-11-29 DIAGNOSIS — J342 Deviated nasal septum: Secondary | ICD-10-CM | POA: Diagnosis not present

## 2018-11-29 DIAGNOSIS — G4733 Obstructive sleep apnea (adult) (pediatric): Secondary | ICD-10-CM | POA: Diagnosis not present

## 2019-01-08 ENCOUNTER — Telehealth: Payer: Self-pay | Admitting: Neurology

## 2019-01-08 NOTE — Telephone Encounter (Signed)
We have attempted to call the patient 2 times to schedule sleep study. Patient has been unavailable at the phone numbers we have on file and has not returned our calls. At this point we will send a letter asking pt to please contact the sleep lab to schedule their sleep study. If patient calls back we will schedule them for their sleep study. ° °

## 2019-01-17 ENCOUNTER — Ambulatory Visit: Payer: 59 | Admitting: Family Medicine

## 2019-01-29 DIAGNOSIS — Z719 Counseling, unspecified: Secondary | ICD-10-CM | POA: Diagnosis not present

## 2019-04-25 ENCOUNTER — Ambulatory Visit: Payer: 59 | Admitting: Family Medicine

## 2019-05-16 IMAGING — CT CT MAXILLOFACIAL WITHOUT CONTRAST
3 of 5 series · 15 of 47 positions shown, 18 images · non-contrast
Comparison: None.

CLINICAL DATA: Nasal swelling, excessive snoring

EXAM:
CT MAXILLOFACIAL WITHOUT CONTRAST
TECHNIQUE: Multidetector CT imaging of the maxillofacial structures was
performed. Multiplanar CT image reconstructions were also generated.

[Series 2: sinus 1.00 hr60 s3 fusion thins · axial · 0.29mm/px · z∈[-622,-505]mm · 9 of 222 slices shown, 12 images]
[im 14/222  brain]
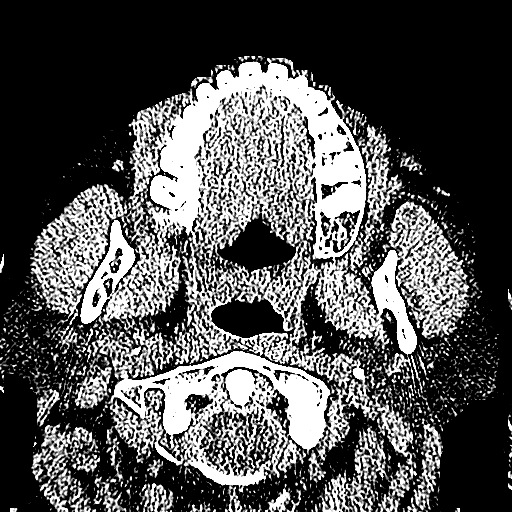
[im 14/222  bone]
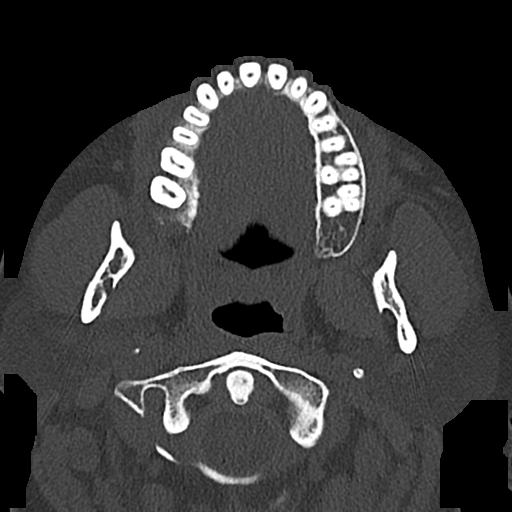
[im 40/222  bone]
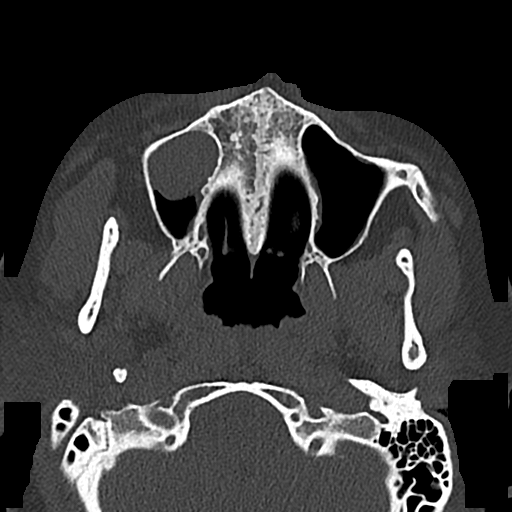
[im 66/222  bone]
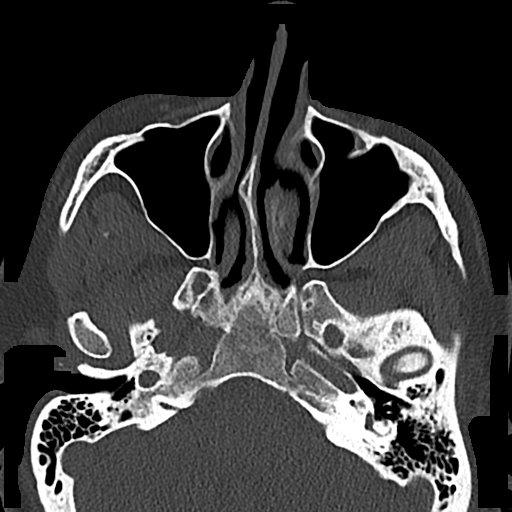
[im 92/222  bone]
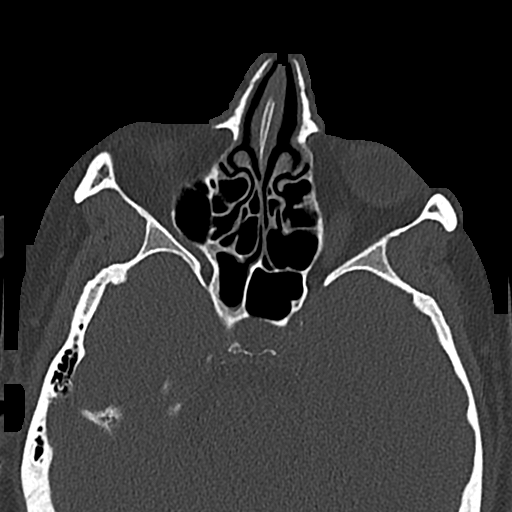
[im 118/222  brain]
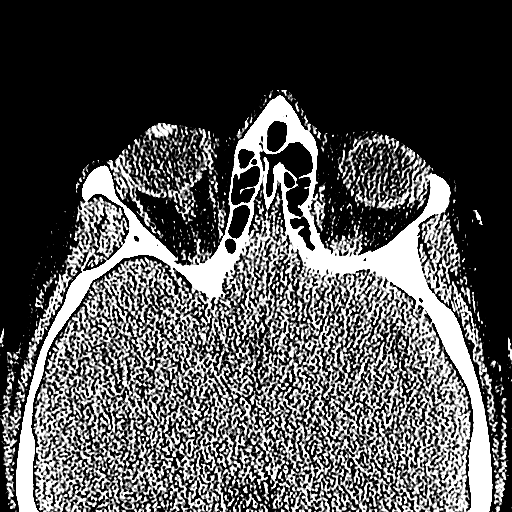
[im 118/222  bone]
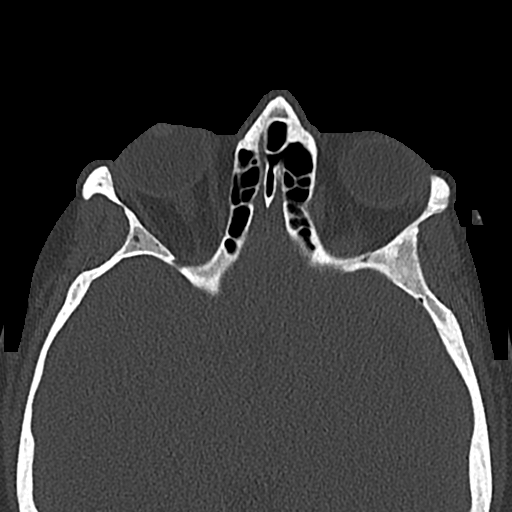
[im 131/222  bone]
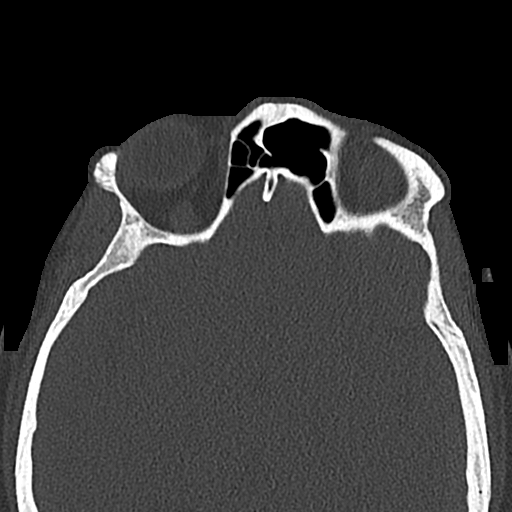
[im 157/222  bone]
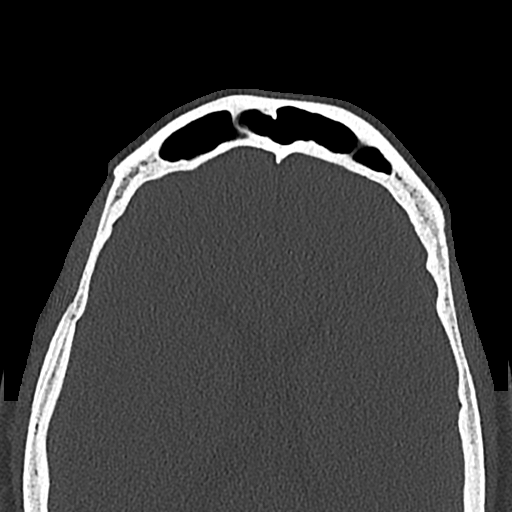
[im 183/222  bone]
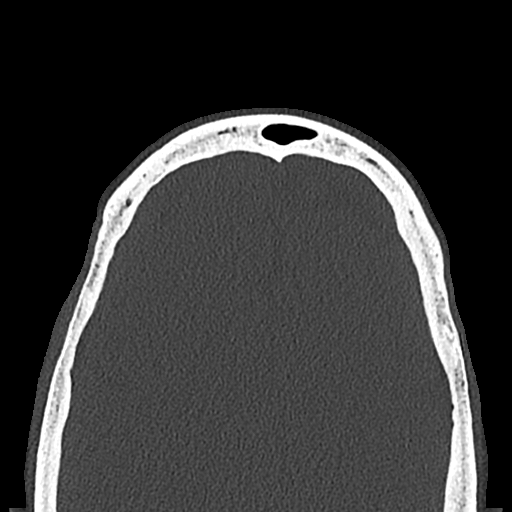
[im 209/222  brain]
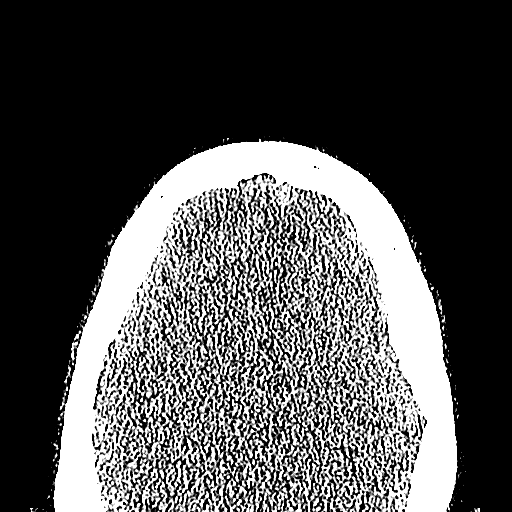
[im 209/222  bone]
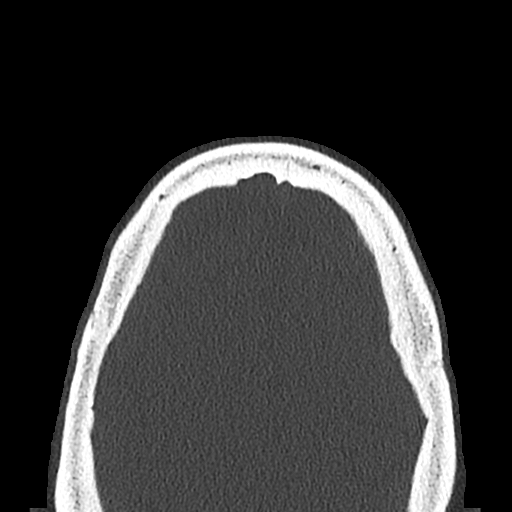

[Series 7: sinus 2.00 hr60 s3 cor · coronal · 0.27mm/px · 3 of 86 slices shown]
[im 29/86  bone]
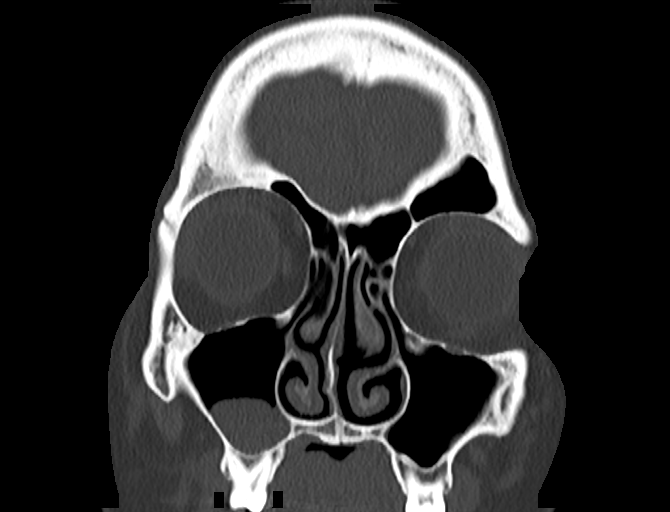
[im 38/86  bone]
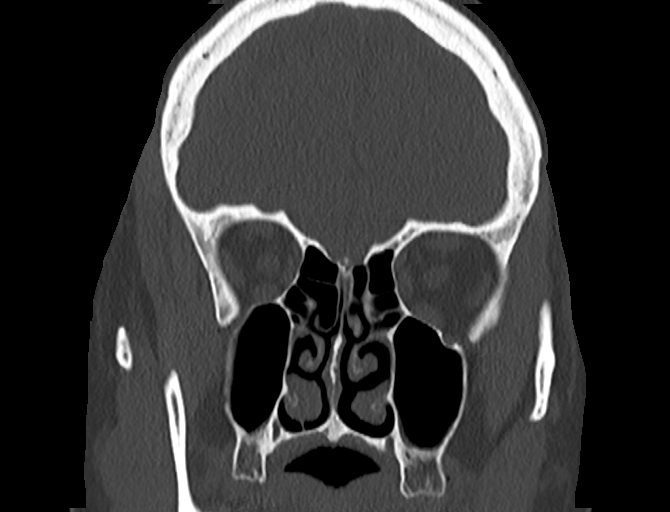
[im 48/86  bone]
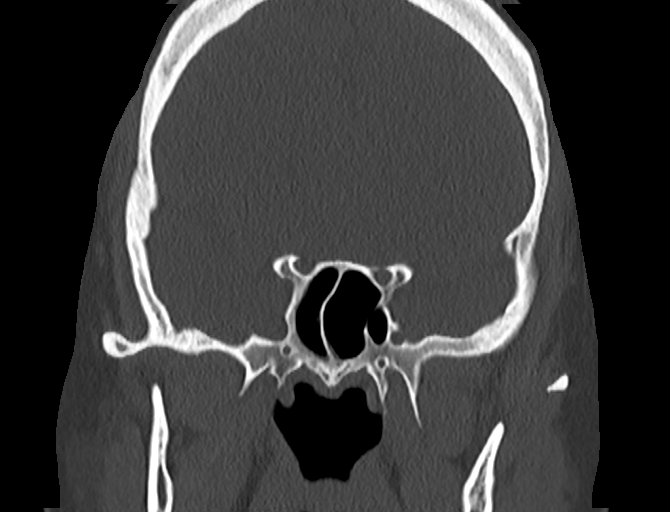

[Series 9: sinus 2.00 hr60 s3 sag · sagittal · 0.27mm/px · 3 of 89 slices shown]
[im 30/89  bone]
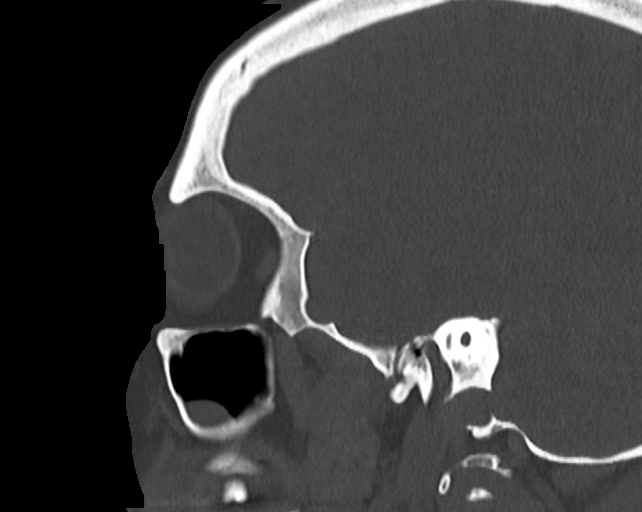
[im 45/89  bone]
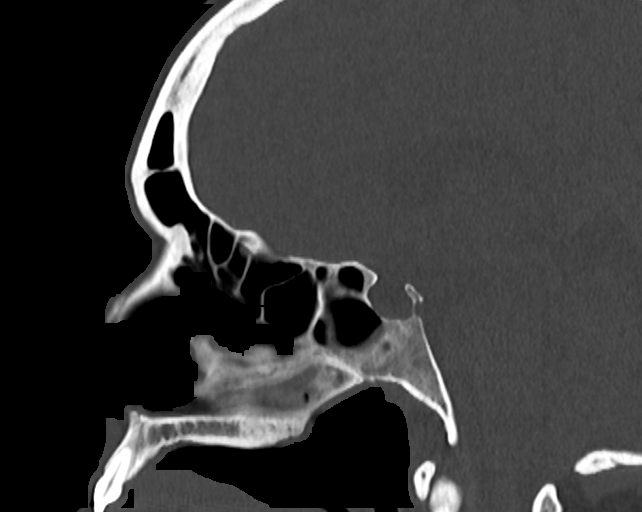
[im 59/89  bone]
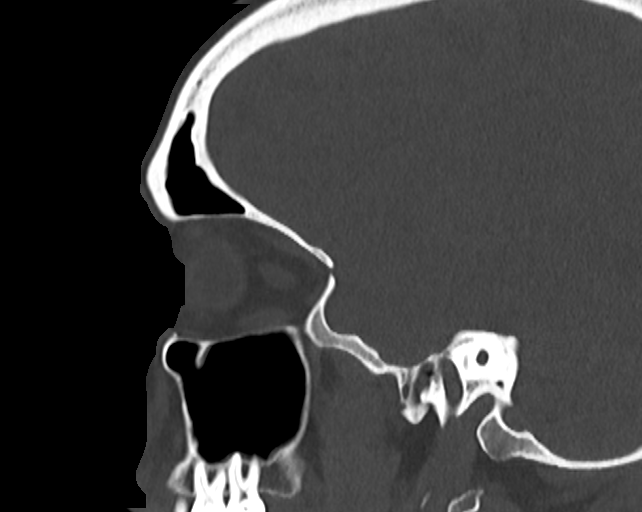

[15 of 47 positions shown; findings below may reference images not displayed]

FINDINGS: Osseous: No fracture or mandibular dislocation. No destructive
process.

Orbits: Negative. No traumatic or inflammatory finding.

Sinuses and nasal cavity: There is a mucoid retention cyst in the
inferior right maxillary sinus. No sinus or nasal cavity mucosal
thickening, air-fluid levels, or bony remodeling. The ostiomeatal
complexes and frontal recesses are patent. Mild rightward deviation
of the nasal septum.

Soft tissues: Negative.

Limited intracranial: No significant or unexpected finding.
IMPRESSION: No sinus or nasal cavity mucosal thickening, air-fluid levels, or
bony remodeling. The ostiomeatal complexes and frontal recesses are
patent. Mild rightward deviation of the nasal septum.

## 2019-06-14 ENCOUNTER — Encounter: Payer: Self-pay | Admitting: Family Medicine

## 2019-06-14 ENCOUNTER — Ambulatory Visit: Payer: 59 | Admitting: Family Medicine

## 2019-06-14 ENCOUNTER — Other Ambulatory Visit: Payer: Self-pay

## 2019-06-14 VITALS — BP 134/84 | HR 80 | Temp 98.2°F | Ht 63.0 in | Wt 265.6 lb

## 2019-06-14 DIAGNOSIS — E785 Hyperlipidemia, unspecified: Secondary | ICD-10-CM

## 2019-06-14 DIAGNOSIS — D500 Iron deficiency anemia secondary to blood loss (chronic): Secondary | ICD-10-CM

## 2019-06-14 DIAGNOSIS — Z23 Encounter for immunization: Secondary | ICD-10-CM | POA: Diagnosis not present

## 2019-06-14 NOTE — Patient Instructions (Signed)
° ° ° °  If you have lab work done today you will be contacted with your lab results within the next 2 weeks.  If you have not heard from us then please contact us. The fastest way to get your results is to register for My Chart. ° ° °IF you received an x-ray today, you will receive an invoice from [facility]. Please contact [facility] at [phone] with questions or concerns regarding your invoice.  ° °IF you received labwork today, you will receive an invoice from [facility]. Please contact [facility] at [phone] with questions or concerns regarding your invoice.  ° °Our billing staff will not be able to assist you with questions regarding bills from these companies. ° °You will be contacted with the lab results as soon as they are available. The fastest way to get your results is to activate your My Chart account. Instructions are located on the last page of this paperwork. If you have not heard from us regarding the results in 2 weeks, please contact this office. °  ° ° ° °

## 2019-06-14 NOTE — Progress Notes (Signed)
10/9/20208:19 AM  Carol Parks 1971/03/20, 48 y.o., female [id number]  Chief Complaint  Patient presents with  . Anemia    follow up or iron deficiency due to chronic iron loss. Says she has not given blood this yr. Has not taken iron in months    HPI:   Patient is a 48 y.o. female with past medical history significant for iron deficiency anemia, seasonal allergies, GERD who presents today for followup  Last OV feb 2020 Has stopped donating blood, she is O - Has stopped iron supplement  She has also been working on weight loss Calorie counting, the real appeal, exercising more  CBC Latest Ref Rng & Units 10/19/2018 07/24/2018 07/25/2016  WBC 3.4 - 10.8 x10E3/uL 8.8 8.7 9.1  Hemoglobin 11.1 - 15.9 g/dL 11.8 10.7(L) 13.5  Hematocrit 34.0 - 46.6 % 37.9 35.7 40.9  Platelets 150 - 450 x10E3/uL 411 464(H) 323   Lab Results  Component Value Date   IRON 128 10/19/2018   TIBC 356 10/19/2018   FERRITIN 17 10/19/2018    Depression screen PHQ 2/9 10/19/2018 07/24/2018 08/02/2017  Decreased Interest 0 0 0  Down, Depressed, Hopeless 0 0 0  PHQ - 2 Score 0 0 0    Fall Risk  10/19/2018 07/24/2018 08/02/2017 05/24/2017 11/21/2016  Falls in the past year? 0 0 No No No  Injury with Fall? 0 - - - -     Allergies  Allergen Reactions  . Penicillins Other (See Comments)    Get yeast infection  . Latex Rash    Prior to Admission medications   Medication Sig Start Date End Date Taking? Authorizing Provider  cetirizine (ZYRTEC) 10 MG chewable tablet Chew 10 mg by mouth daily.   Yes [provider]  ferrous gluconate (FERGON) 324 MG tablet Take 1 tablet (324 mg total) by mouth daily with breakfast. 09/04/18  Yes Rutherford Guys, MD  fluticasone [facility]) 50 MCG/ACT nasal spray Place 2 sprays into both nostrils daily. 06/29/16  Yes Harrison Mons, PA  Omeprazole 20 MG TBEC Take 1 tablet (20 mg total) by mouth daily before breakfast. For six weeks 11/21/16  Yes Rosemarie Ax,  MD    Past Medical History:  Diagnosis Date  . Allergy   . GERD (gastroesophageal reflux disease)   . Medical history non-contributory     Past Surgical History:  Procedure Laterality Date  . BREAST REDUCTION SURGERY Bilateral   . BREAST SURGERY    . TUBAL LIGATION Bilateral 09/05/2013   Procedure: POST PARTUM TUBAL LIGATION;  Surgeon: Alwyn Pea, MD;  Location: [facility];  Service: Gynecology;  Laterality: Bilateral;    Social History   Tobacco Use  . Smoking status: Never Smoker  . Smokeless tobacco: Never Used  Substance Use Topics  . Alcohol use: No    Alcohol/week: 0.0 standard drinks    Family History  Problem Relation Age of Onset  . Hypertension Mother   . Cancer Mother   . Heart attack Father   . Aneurysm Father   . Heart attack Maternal Grandmother   . Heart attack Paternal Grandmother   . Heart attack Paternal Grandfather   . Asthma Daughter   . Mental retardation Paternal Aunt     Review of Systems  Constitutional: Negative for chills, fever and malaise/fatigue.  Respiratory: Negative for cough and shortness of breath.   Cardiovascular: Negative for chest pain, palpitations and leg swelling.  Gastrointestinal: Negative for abdominal pain, nausea and vomiting.  Neurological: Negative  for dizziness.     OBJECTIVE:  Today's Vitals   06/14/19 0812  BP: 134/84  Pulse: 80  Temp: 98.2 F (36.8 C)  SpO2: 98%  Weight: 265 lb 9.6 oz (120.5 kg)  Height: 5\' 3"  (1.6 m)   Body mass index is 47.05 kg/m.  Wt Readings from Last 3 Encounters:  06/14/19 265 lb 9.6 oz (120.5 kg)  11/14/18 279 lb (126.6 kg)  10/19/18 280 lb (127 kg)    Physical Exam Vitals signs and nursing note reviewed.  Constitutional:      Appearance: She is well-developed.  HENT:     Head: Normocephalic and atraumatic.     Mouth/Throat:     Pharynx: No oropharyngeal exudate.  Eyes:     General: No scleral icterus.    Conjunctiva/sclera: Conjunctivae normal.     Pupils:  Pupils are equal, round, and reactive to light.  Neck:     Musculoskeletal: Neck supple.  Cardiovascular:     Rate and Rhythm: Normal rate and regular rhythm.     Heart sounds: Normal heart sounds. No murmur. No friction rub. No gallop.   Pulmonary:     Effort: Pulmonary effort is normal.     Breath sounds: Normal breath sounds. No wheezing or rales.  Skin:    General: Skin is warm and dry.  Neurological:     Mental Status: She is alert and oriented to person, place, and time.     No results found for this or any previous visit (from the past 24 hour(s)).  No results found.   ASSESSMENT and PLAN  1. Iron deficiency anemia due to chronic blood loss Due to high rate of blood donation. Anemia resolved. Discussed return to blood donation q 3 months.  - CBC; Future  2. Dyslipidemia Cont with LFM - Comprehensive metabolic panel; Future - Lipid panel; Future  3. Need for prophylactic vaccination with combined diphtheria-tetanus-pertussis (DTP) vaccine  4. Need for prophylactic vaccination and inoculation against influenza - Flu Vaccine QUAD 36+ mos IM  Return in about 6 weeks (around 07/25/2019) for CPE, fasting labs 3-4 days prior.    Rutherford Guys, MD [facility] [place], [place] [address] Ph.  [phone] Fax [phone]

## 2019-07-10 ENCOUNTER — Ambulatory Visit (INDEPENDENT_AMBULATORY_CARE_PROVIDER_SITE_OTHER): Payer: 59 | Admitting: Neurology

## 2019-07-10 DIAGNOSIS — Z6841 Body Mass Index (BMI) 40.0 and over, adult: Secondary | ICD-10-CM

## 2019-07-10 DIAGNOSIS — G4733 Obstructive sleep apnea (adult) (pediatric): Secondary | ICD-10-CM

## 2019-07-10 DIAGNOSIS — G4719 Other hypersomnia: Secondary | ICD-10-CM

## 2019-07-10 DIAGNOSIS — R519 Headache, unspecified: Secondary | ICD-10-CM

## 2019-07-10 DIAGNOSIS — R0683 Snoring: Secondary | ICD-10-CM

## 2019-07-22 ENCOUNTER — Telehealth: Payer: Self-pay

## 2019-07-22 NOTE — Procedures (Signed)
Patient Information     First Name: Carol Last Name: Parks [place]: [id number]  Birth Date: [birth year] Age: 48 Gender: Female  Referring Provider: Rutherford Guys, MD BMI: 47.8 (W=280 lb, H=5' 4'')  Neck Circ.:  16 '' Epworth:  15/24   Sleep Study Information    Study Date: Jul 10, 2019 S/H/A Version: [id number] / 4.1.1528 / 36  History:    48 year old woman with a history of allergies, reflux disease, nasal polyps, anemia and obesity, who reports snoring and excessive daytime somnolence. Summary & Diagnosis:    OSA  Recommendations:     This home sleep test demonstrates moderate obstructive sleep apnea with a total AHI of 21.6/hour and O2 nadir of 80%. Treatment with positive airway pressure (in the form of CPAP) is recommended. This will require, ideally, a full night CPAP titration study for proper treatment settings, O2 monitoring and mask fitting. Based on the severity of the sleep disordered breathing an attended titration study is indicated. However, patient's insurance has denied an attended sleep study; therefore, the patient will be advised to proceed with an autoPAP titration/trial at home for now. Please note that untreated obstructive sleep apnea may carry additional perioperative morbidity. Patients with significant obstructive sleep apnea should receive perioperative PAP therapy and the surgeons and particularly the anesthesiologist should be informed of the diagnosis and the severity of the sleep disordered breathing. Weight loss is recommended to help alleviate the severity of her sleep disordered breathing. The patient should be cautioned not to drive, work at heights, or operate dangerous or heavy equipment when tired or sleepy. Review and reiteration of good sleep hygiene measures should be pursued with any patient. Other causes of the patient's symptoms, including circadian rhythm disturbances, an underlying mood disorder, medication effect and/or an underlying medical problem  cannot be ruled out based on this test. Clinical correlation is recommended. The patient and her referring provider will be notified of the test results. The patient will be seen in follow up in sleep clinic at [facility].  I certify that I have reviewed the raw data recording prior to the issuance of this report in accordance with the standards of the American Academy of Sleep Medicine (AASM).  Star Age, MD, PhD [facility]) Diplomat, ABPN (Neurology and Sleep)            Sleep Summary  Oxygen Saturation Statistics   Start Study Time: End Study Time: Total Recording Time:     11:07:17 PM 7:33:52 AM      8 h, 26 min  Total Sleep Time % REM of Sleep Time:  6 h, 41 min  26.9    Mean: 95 Minimum: 80 Maximum: 100  Mean of Desaturations Nadirs (%):   92  Oxygen Desaturation. %:   4-9 10-20 >20 Total  Events Number Total    50  1 98.0 2.0  0 0.0  51 100.0  Oxygen Saturation: <90 <=88 <85 <80 <70  Duration (minutes): Sleep % 0.3 0.1  0.2 0.1  0.0 0.0 0.0 0.0 0.0 0.0     Respiratory Indices      Total Events REM NREM All Night  pRDI:  182  pAHI:  134 ODI:  51  pAHIc:  16  % CSR: 0.0 40.8 31.8 11.2 3.4 24.7 17.4 7.0 2.3 29.3 21.6 8.2 2.6       Pulse Rate Statistics during Sleep (BPM)      Mean:  76 Minimum: 57  Maximum:  116    Indices are calculated using technically valid sleep time of  6 hrs, 12 min. pRDI/pAHI are calculated using oxi desaturations ? 3%   Body Position Statistics  Position Supine Prone Right Left Non-Supine  Sleep (min) 400.6 0.0 1.0 0.0 1.0  Sleep % 99.8 0.0 0.2 0.0 0.2  pRDI 29.3 N/A N/A N/A N/A  pAHI 21.6 N/A N/A N/A N/A  ODI 8.2 N/A N/A N/A N/A     Snoring Statistics Snoring Level (dB) >40 >50 >60 >70 >80 >Threshold (45)  Sleep (min) 189.5 63.2 4.9 0.0 0.0 93.6  Sleep % 47.2 15.7 1.2 0.0 0.0 23.3    Mean: 44 dB Sleep Stages Chart                                 pAHI=21.6                                              Mild              Moderate                    Severe                                                 5              15                    30

## 2019-07-22 NOTE — Telephone Encounter (Signed)
-----   Message from Star Age, MD sent at 07/22/2019  8:56 AM EST ----- Patient referred by Dr. Pamella Pert, seen by me on 11/14/18, HST on 07/10/19.    Please call and notify the patient that the recent home sleep test showed obstructive sleep apnea in the moderate range. While I recommend treatment for this in the form CPAP, her insurance will not approve a sleep study for this. They will likely only approve a trial of autoPAP, which means, that we don't have to bring her in for a sleep study with CPAP, but will let her try an autoPAP machine at home, through a DME company (of her choice, or as per insurance requirement). The DME representative will educate her on how to use the machine, how to put the mask on, etc. I have placed an order in the chart. Please send referral, talk to patient, send report to referring MD. We will need a FU in sleep clinic for 10 weeks post-PAP set up, please arrange that with me or one of our NPs. Thanks,   Star Age, MD, PhD [facility])

## 2019-07-22 NOTE — Telephone Encounter (Signed)
I called pt. I advised pt that Dr. Rexene Alberts reviewed their sleep study results and found that pt has moderate osa. Dr. Rexene Alberts recommends that pt start an auto pap at home. I reviewed PAP compliance expectations with the pt. Pt is agreeable to starting an auto-PAP. I advised pt that an order will be sent to a DME, [facility], and [facility] will call the pt within about one week after they file with the pt's insurance. [facility] will show the pt how to use the machine, fit for masks, and troubleshoot the auto-PAP if needed. A follow up appt was made for insurance purposes with Amy, NP on 10/09/19 at 8:00am. Pt verbalized understanding to arrive 15 minutes early and bring their auto-PAP. A letter with all of this information in it will be mailed to the pt as a reminder. I verified with the pt that the address we have on file is correct. Pt verbalized understanding of results. Pt had no questions at this time but was encouraged to call back if questions arise. I have sent the order to [facility] and have received confirmation that they have received the order.

## 2019-07-22 NOTE — Progress Notes (Signed)
Patient referred by Dr. Pamella Pert, seen by me on 11/14/18, HST on 07/10/19.    Please call and notify the patient that the recent home sleep test showed obstructive sleep apnea in the moderate range. While I recommend treatment for this in the form CPAP, her insurance will not approve a sleep study for this. They will likely only approve a trial of autoPAP, which means, that we don't have to bring her in for a sleep study with CPAP, but will let her try an autoPAP machine at home, through a DME company (of her choice, or as per insurance requirement). The DME representative will educate her on how to use the machine, how to put the mask on, etc. I have placed an order in the chart. Please send referral, talk to patient, send report to referring MD. We will need a FU in sleep clinic for 10 weeks post-PAP set up, please arrange that with me or one of our NPs. Thanks,   Star Age, MD, PhD [facility])

## 2019-07-22 NOTE — Addendum Note (Signed)
Addended by: Star Age on: 07/22/2019 08:56 AM   Modules accepted: Orders

## 2019-07-23 ENCOUNTER — Ambulatory Visit (INDEPENDENT_AMBULATORY_CARE_PROVIDER_SITE_OTHER): Payer: 59 | Admitting: Family Medicine

## 2019-07-23 ENCOUNTER — Other Ambulatory Visit: Payer: Self-pay

## 2019-07-23 DIAGNOSIS — E785 Hyperlipidemia, unspecified: Secondary | ICD-10-CM

## 2019-07-23 DIAGNOSIS — D500 Iron deficiency anemia secondary to blood loss (chronic): Secondary | ICD-10-CM

## 2019-07-24 LAB — COMPREHENSIVE METABOLIC PANEL
ALT: 11 IU/L (ref 0–32)
AST: 15 IU/L (ref 0–40)
Albumin/Globulin Ratio: 1.6 (ref 1.2–2.2)
Albumin: 4 g/dL (ref 3.8–4.8)
Alkaline Phosphatase: 94 IU/L (ref 39–117)
BUN/Creatinine Ratio: 17 (ref 9–23)
BUN: 14 mg/dL (ref 6–24)
Bilirubin Total: 0.4 mg/dL (ref 0.0–1.2)
CO2: 23 mmol/L (ref 20–29)
Calcium: 8.7 mg/dL (ref 8.7–10.2)
Chloride: 104 mmol/L (ref 96–106)
Creatinine, Ser: 0.81 mg/dL (ref 0.57–1.00)
GFR calc Af Amer: 101 mL/min/{1.73_m2} (ref >59)
GFR calc non Af Amer: 87 mL/min/{1.73_m2} (ref >59)
Globulin, Total: 2.5 g/dL (ref 1.5–4.5)
Glucose: 87 mg/dL (ref 65–99)
Potassium: 4.7 mmol/L (ref 3.5–5.2)
Sodium: 140 mmol/L (ref 134–144)
Total Protein: 6.5 g/dL (ref 6.0–8.5)

## 2019-07-24 LAB — CBC
Hematocrit: 41.3 % (ref 34.0–46.6)
Hemoglobin: 13.6 g/dL (ref 11.1–15.9)
MCH: 29.7 pg (ref 26.6–33.0)
MCHC: 32.9 g/dL (ref 31.5–35.7)
MCV: 90 fL (ref 79–97)
Platelets: 384 10*3/uL (ref 150–450)
RBC: 4.58 x10E6/uL (ref 3.77–5.28)
RDW: 13.3 % (ref 11.7–15.4)
WBC: 10.4 10*3/uL (ref 3.4–10.8)

## 2019-07-24 LAB — LIPID PANEL
Chol/HDL Ratio: 4.3 ratio (ref 0.0–4.4)
Cholesterol, Total: 196 mg/dL (ref 100–199)
HDL: 46 mg/dL (ref >39)
LDL Chol Calc (NIH): 135 mg/dL — ABNORMAL HIGH (ref 0–99)
Triglycerides: 80 mg/dL (ref 0–149)
VLDL Cholesterol Cal: 15 mg/dL (ref 5–40)

## 2019-07-26 ENCOUNTER — Ambulatory Visit (INDEPENDENT_AMBULATORY_CARE_PROVIDER_SITE_OTHER): Payer: 59 | Admitting: Family Medicine

## 2019-07-26 ENCOUNTER — Encounter: Payer: Self-pay | Admitting: Family Medicine

## 2019-07-26 ENCOUNTER — Other Ambulatory Visit: Payer: Self-pay

## 2019-07-26 ENCOUNTER — Other Ambulatory Visit (HOSPITAL_COMMUNITY)
Admission: RE | Admit: 2019-07-26 | Discharge: 2019-07-26 | Disposition: A | Discharge status: Home / Self Care | Payer: 59 | Source: Ambulatory Visit | Attending: Family Medicine | Admitting: Family Medicine

## 2019-07-26 VITALS — BP 127/83 | HR 86 | Temp 99.2°F | Ht 63.0 in | Wt 262.0 lb

## 2019-07-26 DIAGNOSIS — Z01419 Encounter for gynecological examination (general) (routine) without abnormal findings: Secondary | ICD-10-CM

## 2019-07-26 DIAGNOSIS — Z1151 Encounter for screening for human papillomavirus (HPV): Secondary | ICD-10-CM | POA: Insufficient documentation

## 2019-07-26 NOTE — Patient Instructions (Addendum)
   If you have lab work done today you will be contacted with your lab results within the next 2 weeks.  If you have not heard from us then please contact us. The fastest way to get your results is to register for My Chart.   IF you received an x-ray today, you will receive an invoice from [facility]. Please contact [facility] at [phone] with questions or concerns regarding your invoice.   IF you received labwork today, you will receive an invoice from [facility]. Please contact [facility] at [phone] with questions or concerns regarding your invoice.   Our billing staff will not be able to assist you with questions regarding bills from these companies.  You will be contacted with the lab results as soon as they are available. The fastest way to get your results is to activate your My Chart account. Instructions are located on the last page of this paperwork. If you have not heard from us regarding the results in 2 weeks, please contact this office.     Preventive Care 40-64 Years Old, Female Preventive care refers to visits with your health care provider and lifestyle choices that can promote health and wellness. This includes:  A yearly physical exam. This may also be called an annual well check.  Regular dental visits and eye exams.  Immunizations.  Screening for certain conditions.  Healthy lifestyle choices, such as eating a healthy diet, getting regular exercise, not using drugs or products that contain nicotine and tobacco, and limiting alcohol use. What can I expect for my preventive care visit? Physical exam Your health care provider will check your:  Height and weight. This may be used to calculate body mass index (BMI), which tells if you are at a healthy weight.  Heart rate and blood pressure.  Skin for abnormal spots. Counseling Your health care provider may ask you questions about your:  Alcohol, tobacco, and drug use.  Emotional  well-being.  Home and relationship well-being.  Sexual activity.  Eating habits.  Work and work environment.  Method of birth control.  Menstrual cycle.  Pregnancy history. What immunizations do I need?  Influenza (flu) vaccine  This is recommended every year. Tetanus, diphtheria, and pertussis (Tdap) vaccine  You may need a Td booster every 10 years. Varicella (chickenpox) vaccine  You may need this if you have not been vaccinated. Zoster (shingles) vaccine  You may need this after age 60. Measles, mumps, and rubella (MMR) vaccine  You may need at least one dose of MMR if you were born in 1957 or later. You may also need a second dose. Pneumococcal conjugate (PCV13) vaccine  You may need this if you have certain conditions and were not previously vaccinated. Pneumococcal polysaccharide (PPSV23) vaccine  You may need one or two doses if you smoke cigarettes or if you have certain conditions. Meningococcal conjugate (MenACWY) vaccine  You may need this if you have certain conditions. Hepatitis A vaccine  You may need this if you have certain conditions or if you travel or work in places where you may be exposed to hepatitis A. Hepatitis B vaccine  You may need this if you have certain conditions or if you travel or work in places where you may be exposed to hepatitis B. Haemophilus influenzae type b (Hib) vaccine  You may need this if you have certain conditions. Human papillomavirus (HPV) vaccine  If recommended by your health care provider, you may need three doses over 6 months.   You may receive vaccines as individual doses or as more than one vaccine together in one shot (combination vaccines). Talk with your health care provider about the risks and benefits of combination vaccines. What tests do I need? Blood tests  Lipid and cholesterol levels. These may be checked every 5 years, or more frequently if you are over 50 years old.  Hepatitis C  test.  Hepatitis B test. Screening  Lung cancer screening. You may have this screening every year starting at age 55 if you have a 30-pack-year history of smoking and currently smoke or have quit within the past 15 years.  Colorectal cancer screening. All adults should have this screening starting at age 50 and continuing until age 75. Your health care provider may recommend screening at age 45 if you are at increased risk. You will have tests every 1-10 years, depending on your results and the type of screening test.  Diabetes screening. This is done by checking your blood sugar (glucose) after you have not eaten for a while (fasting). You may have this done every 1-3 years.  Mammogram. This may be done every 1-2 years. Talk with your health care provider about when you should start having regular mammograms. This may depend on whether you have a family history of breast cancer.  BRCA-related cancer screening. This may be done if you have a family history of breast, ovarian, tubal, or peritoneal cancers.  Pelvic exam and Pap test. This may be done every 3 years starting at age 21. Starting at age 30, this may be done every 5 years if you have a Pap test in combination with an HPV test. Other tests  Sexually transmitted disease (STD) testing.  Bone density scan. This is done to screen for osteoporosis. You may have this scan if you are at high risk for osteoporosis. Follow these instructions at home: Eating and drinking  Eat a diet that includes fresh fruits and vegetables, whole grains, lean protein, and low-fat dairy.  Take vitamin and mineral supplements as recommended by your health care provider.  Do not drink alcohol if: ? Your health care provider tells you not to drink. ? You are pregnant, may be pregnant, or are planning to become pregnant.  If you drink alcohol: ? Limit how much you have to 0-1 drink a day. ? Be aware of how much alcohol is in your drink. In the [place] one  drink equals one 12 oz bottle of beer (355 mL), one 5 oz glass of wine (148 mL), or one 1 oz glass of hard liquor (44 mL). Lifestyle  Take daily care of your teeth and gums.  Stay active. Exercise for at least 30 minutes on 5 or more days each week.  Do not use any products that contain nicotine or tobacco, such as cigarettes, e-cigarettes, and chewing tobacco. If you need help quitting, ask your health care provider.  If you are sexually active, practice safe sex. Use a condom or other form of birth control (contraception) in order to prevent pregnancy and STIs (sexually transmitted infections).  If told by your health care provider, take low-dose aspirin daily starting at age 50. What's next?  Visit your health care provider once a year for a well check visit.  Ask your health care provider how often you should have your eyes and teeth checked.  Stay up to date on all vaccines. This information is not intended to replace advice given to you by your health care provider. Make sure   you discuss any questions you have with your health care provider. Document Released: 09/18/2015 Document Revised: 05/03/2018 Document Reviewed: 05/03/2018 Elsevier Patient Education  2020 [employer]  

## 2019-07-26 NOTE — Progress Notes (Signed)
11/20/20208:19 AM  Carol Parks 13-Dec-1970, 48 y.o., female [id number]  Chief Complaint  Patient presents with  . Annual Exam    HPI:   Patient is a 48 y.o. female with past medical history significant for GDM2, GERD, iron deficiency, OSA on cpap who presents today for CPE  G&Ps: 3003 Pap: June 2017, denies h/o abnormal STD: 2017 BC : BTL Menses: regular, getting a bit longer, normal flow Mammogram: 2019, thru mobile van FHX breast/ovarian cancer: mother BRCA, postmeno FHx colon cancer: none Exercise/diet: regularly, doing weight loss program via [facility], has lost 20 lbs Eye/Dentist: has had appts this year  Lab Results  Component Value Date   CREATININE 0.81 07/23/2019   BUN 14 07/23/2019   NA 140 07/23/2019   K 4.7 07/23/2019   CL 104 07/23/2019   CO2 23 07/23/2019   Lab Results  Component Value Date   ALT 11 07/23/2019   AST 15 07/23/2019   ALKPHOS 94 07/23/2019   BILITOT 0.4 07/23/2019   Lab Results  Component Value Date   CHOL 196 07/23/2019   HDL 46 07/23/2019   LDLCALC 135 (H) 07/23/2019   TRIG 80 07/23/2019   CHOLHDL 4.3 07/23/2019   Lab Results  Component Value Date   WBC 10.4 07/23/2019   HGB 13.6 07/23/2019   HCT 41.3 07/23/2019   MCV 90 07/23/2019   PLT 384 07/23/2019   Lab Results  Component Value Date   HGBA1C 5.6 07/24/2018    Most Recent Immunizations  Administered Date(s) Administered  . DTaP 06/05/2013  . Influenza,inj,Quad PF,6+ Mos 06/14/2019  . Rho (D) Immune Globulin 09/05/2013  . Tdap 07/24/2018    Depression screen [facility] 2/9 07/26/2019 06/14/2019 10/19/2018  Decreased Interest 0 0 0  Down, Depressed, Hopeless 0 0 0  PHQ - 2 Score 0 0 0    Fall Risk  07/26/2019 06/14/2019 10/19/2018 07/24/2018 08/02/2017  Falls in the past year? 0 0 0 0 No  Number falls in past yr: 0 0 - - -  Injury with Fall? 0 0 0 - -     Allergies  Allergen Reactions  . Penicillins Other (See Comments)    Get yeast infection  .  Latex Rash    Prior to Admission medications   Medication Sig Start Date End Date Taking? Authorizing Provider  cetirizine (ZYRTEC) 10 MG chewable tablet Chew 10 mg by mouth daily.   Yes [provider]  ferrous gluconate (FERGON) 324 MG tablet Take 1 tablet (324 mg total) by mouth daily with breakfast. 09/04/18  Yes Rutherford Guys, MD  fluticasone [facility]) 50 MCG/ACT nasal spray Place 2 sprays into both nostrils daily. 06/29/16  Yes Harrison Mons, PA  Omeprazole 20 MG TBEC Take 1 tablet (20 mg total) by mouth daily before breakfast. For six weeks 11/21/16  Yes Rosemarie Ax, MD    Past Medical History:  Diagnosis Date  . Allergy   . GERD (gastroesophageal reflux disease)   . Medical history non-contributory     Past Surgical History:  Procedure Laterality Date  . BREAST REDUCTION SURGERY Bilateral   . BREAST SURGERY    . TUBAL LIGATION Bilateral 09/05/2013   Procedure: POST PARTUM TUBAL LIGATION;  Surgeon: Alwyn Pea, MD;  Location: [facility];  Service: Gynecology;  Laterality: Bilateral;    Social History   Tobacco Use  . Smoking status: Never Smoker  . Smokeless tobacco: Never Used  Substance Use Topics  . Alcohol use: No  Alcohol/week: 0.0 standard drinks    Family History  Problem Relation Age of Onset  . Hypertension Mother   . Cancer Mother   . Heart attack Father   . Aneurysm Father   . Heart attack Maternal Grandmother   . Heart attack Paternal Grandmother   . Heart attack Paternal Grandfather   . Asthma Daughter   . Mental retardation Paternal Aunt     Review of Systems  Constitutional: Negative for chills and fever.  Respiratory: Negative for cough and shortness of breath.   Cardiovascular: Negative for chest pain, palpitations and leg swelling.  Gastrointestinal: Negative for abdominal pain, nausea and vomiting.  All other systems reviewed and are negative.    OBJECTIVE:  Today's Vitals   07/26/19 0809  BP: 127/83  Pulse:  86  Temp: 99.2 F (37.3 C)  SpO2: 100%  Weight: 262 lb (118.8 kg)  Height: '5\' 3"'$  (1.6 m)   Body mass index is 46.41 kg/m.   Physical Exam Vitals signs and nursing note reviewed. Exam conducted with a chaperone present.  Constitutional:      Appearance: She is well-developed.  HENT:     Head: Normocephalic and atraumatic.     Right Ear: Hearing, tympanic membrane, ear canal and external ear normal.     Left Ear: Hearing, tympanic membrane, ear canal and external ear normal.  Eyes:     Conjunctiva/sclera: Conjunctivae normal.     Pupils: Pupils are equal, round, and reactive to light.  Neck:     Musculoskeletal: Neck supple.     Thyroid: No thyromegaly.  Cardiovascular:     Rate and Rhythm: Normal rate and regular rhythm.     Pulses: Normal pulses.     Heart sounds: Normal heart sounds. No murmur. No friction rub. No gallop.   Pulmonary:     Effort: Pulmonary effort is normal.     Breath sounds: Normal breath sounds. No wheezing, rhonchi or rales.  Chest:     Breasts: Breasts are symmetrical.        Right: No inverted nipple, mass, nipple discharge, skin change or tenderness.        Left: No inverted nipple, mass, nipple discharge, skin change or tenderness.  Abdominal:     General: Bowel sounds are normal. There is no distension.     Palpations: Abdomen is soft. There is no hepatomegaly, splenomegaly or mass.     Tenderness: There is no abdominal tenderness. There is no guarding.     Hernia: No hernia is present.  Genitourinary:    Labia:        Right: No rash or lesion.        Left: No rash or lesion.      Vagina: No vaginal discharge or erythema.     Cervix: No cervical motion tenderness, discharge or friability.     Uterus: Not enlarged, not fixed and not tender.      Adnexa:        Right: No mass or tenderness.         Left: No mass or tenderness.    Musculoskeletal: Normal range of motion.     Right lower leg: No edema.     Left lower leg: No edema.   Lymphadenopathy:     Cervical: No cervical adenopathy.     Upper Body:     Right upper body: No supraclavicular, axillary or pectoral adenopathy.     Left upper body: No supraclavicular, axillary or pectoral adenopathy.  Skin:  General: Skin is warm and dry.  Neurological:     General: No focal deficit present.     Mental Status: She is alert and oriented to person, place, and time.     Cranial Nerves: No cranial nerve deficit.     Gait: Gait normal.     Deep Tendon Reflexes: Reflexes are normal and symmetric.  Psychiatric:        Mood and Affect: Mood normal.        Behavior: Behavior normal.     No results found for this or any previous visit (from the past 24 hour(s)).  No results found.   ASSESSMENT and PLAN  1. Women's annual routine gynecological examination No concerns per history or exam. HCM reviewed/discussed. Anticipatory guidance regarding healthy weight, lifestyle and choices given.   - Cytology - PAP([place])  Return in about 1 year (around 07/25/2020).    Rutherford Guys, MD Primary Care at [facility] [place], [place] [address] Ph.  [phone] Fax [phone]

## 2019-07-31 LAB — CYTOLOGY - PAP
Comment: NEGATIVE
Diagnosis: NEGATIVE
High risk HPV: NEGATIVE

## 2019-08-26 ENCOUNTER — Encounter: Payer: Self-pay | Admitting: Family Medicine

## 2019-08-27 NOTE — Telephone Encounter (Signed)
Can you please take care of this?  Thanks!

## 2019-09-17 ENCOUNTER — Telehealth: Payer: Self-pay | Admitting: Family Medicine

## 2019-09-17 NOTE — Telephone Encounter (Signed)
Copied from [place] [id number]. Topic: General - Other >> Sep 17, 2019 12:23 PM Sheran Luz wrote: Patient requesting call back from office staff to discuss recent covid diagnosis.

## 2019-09-18 NOTE — Telephone Encounter (Signed)
I have called pt back and she stated that she was Covid Pos since 09/11/2019. He whole family of 5 are positive so they are quarantining together. 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 Pt is mostly concern about the ongoing cough for the last couple of days. It is painful to cough at times.   Pt has been scheduled for virtual visit on 09/20/2019 @ 4:20pm

## 2019-09-20 ENCOUNTER — Telehealth (INDEPENDENT_AMBULATORY_CARE_PROVIDER_SITE_OTHER): Payer: 59 | Admitting: Family Medicine

## 2019-09-20 ENCOUNTER — Other Ambulatory Visit: Payer: Self-pay

## 2019-09-20 DIAGNOSIS — U071 COVID-19: Secondary | ICD-10-CM

## 2019-09-20 NOTE — Progress Notes (Signed)
Virtual Visit Note  I connected with patient on 09/20/19 at 241pm by video doximity and verified that I am speaking with the correct person using two identifiers. Carol Parks is currently located at home and patient is currently with them during visit. The provider, Rutherford Guys, MD is located in their office at time of visit.  I discussed the limitations, risks, security and privacy concerns of performing an evaluation and management service by telephone and the availability of in person appointments. I also discussed with the patient that there may be a patient responsible charge related to this service. The patient expressed understanding and agreed to proceed.   CC: covid positive  HPI ? Patient tested positive on jan 6th at work Symptoms started 2 nights prior Sx: chills, cough, fatigue and body aches Rest of family members have also tested positive Today she reports that she is starting to feel better, fatigue and body aches are still significant No fever or SOB, cough sign improved Has been in quarantine since diagnosis Per patient she was told by [facility] that quarantine can be lifted on jan 20th, 14 days after positive test, if clinically improved  Allergies  Allergen Reactions  . Penicillins Other (See Comments)    Get yeast infection  . Latex Rash    Prior to Admission medications   Medication Sig Start Date End Date Taking? Authorizing Provider  cetirizine (ZYRTEC) 10 MG chewable tablet Chew 10 mg by mouth daily.   Yes [provider]  ferrous gluconate (FERGON) 324 MG tablet Take 1 tablet (324 mg total) by mouth daily with breakfast. 09/04/18  Yes Rutherford Guys, MD  fluticasone [facility]) 50 MCG/ACT nasal spray Place 2 sprays into both nostrils daily. 06/29/16  Yes Harrison Mons, PA  Omeprazole 20 MG TBEC Take 1 tablet (20 mg total) by mouth daily before breakfast. For six weeks 11/21/16  Yes Rosemarie Ax, MD    Past Medical History:  Diagnosis Date   . Allergy   . GERD (gastroesophageal reflux disease)   . H/O gestational diabetes mellitus, not currently pregnant   . H/O iron deficiency anemia   . OSA on CPAP     Past Surgical History:  Procedure Laterality Date  . BREAST REDUCTION SURGERY Bilateral   . BREAST SURGERY    . TUBAL LIGATION Bilateral 09/05/2013   Procedure: POST PARTUM TUBAL LIGATION;  Surgeon: Alwyn Pea, MD;  Location: [facility];  Service: Gynecology;  Laterality: Bilateral;    Social History   Tobacco Use  . Smoking status: Never Smoker  . Smokeless tobacco: Never Used  Substance Use Topics  . Alcohol use: No    Alcohol/week: 0.0 standard drinks    Family History  Problem Relation Age of Onset  . Hypertension Mother   . Cancer Mother   . Heart attack Father   . Aneurysm Father   . Heart attack Maternal Grandmother   . Heart attack Paternal Grandmother   . Heart attack Paternal Grandfather   . Asthma Daughter   . Mental retardation Paternal Aunt     ROS Per hpi  Objective  Vitals as reported by the patient: none Gen: AAOx3, NAD, nontoxic appearing Breathing comfortably, speaking in full sentences  ASSESSMENT and PLAN  1. COVID-19 virus infection patient clinically stable. Continue with symptomatic care. Quarantine quidelines and RTC precautions reviewed.  FOLLOW-UP: prn   The above assessment and management plan was discussed with the patient. The patient verbalized understanding of and has agreed  to the management plan. Patient is aware to call the clinic if symptoms persist or worsen. Patient is aware when to return to the clinic for a follow-up visit. Patient educated on when it is appropriate to go to the emergency department.    I provided 10 minutes of non-face-to-face time during this encounter.  Rutherford Guys, MD Primary Care at [facility] [place], [place] [address] Ph.  [phone] Fax [phone]

## 2019-09-20 NOTE — Progress Notes (Signed)
Pt has covid along with husband and 3 kid. Will be out of quarantine on the 20th. Cough has gotten much better, using chest rub and cough drops. No other covid symptoms that she is concerned about. Taking lots of vitamins right now

## 2019-10-09 ENCOUNTER — Ambulatory Visit: Payer: Self-pay | Admitting: Family Medicine

## 2019-11-20 NOTE — Patient Instructions (Signed)
Please continue using your CPAP regularly. While your insurance requires that you use CPAP at least 4 hours each night on 70% of the nights, I recommend, that you not skip any nights and use it throughout the night if you can. Getting used to CPAP and staying with the treatment long term does take time and patience and discipline. Untreated obstructive sleep apnea when it is moderate to severe can have an adverse impact on cardiovascular health and raise her risk for heart disease, arrhythmias, hypertension, congestive heart failure, stroke and diabetes. Untreated obstructive sleep apnea causes sleep disruption, nonrestorative sleep, and sleep deprivation. This can have an impact on your day to day functioning and cause daytime sleepiness and impairment of cognitive function, memory loss, mood disturbance, and problems focussing. Using CPAP regularly can improve these symptoms.   Follow up in 6 months    Sleep Apnea Sleep apnea affects breathing during sleep. It causes breathing to stop for a short time or to become shallow. It can also increase the risk of:  Heart attack.  Stroke.  Being very overweight (obese).  Diabetes.  Heart failure.  Irregular heartbeat. The goal of treatment is to help you breathe normally again. What are the causes? There are three kinds of sleep apnea:  Obstructive sleep apnea. This is caused by a blocked or collapsed airway.  Central sleep apnea. This happens when the brain does not send the right signals to the muscles that control breathing.  Mixed sleep apnea. This is a combination of obstructive and central sleep apnea. The most common cause of this condition is a collapsed or blocked airway. This can happen if:  Your throat muscles are too relaxed.  Your tongue and tonsils are too large.  You are overweight.  Your airway is too small. What increases the risk?  Being overweight.  Smoking.  Having a small airway.  Being older.  Being  female.  Drinking alcohol.  Taking medicines to calm yourself (sedatives or tranquilizers).  Having family members with the condition. What are the signs or symptoms?  Trouble staying asleep.  Being sleepy or tired during the day.  Getting angry a lot.  Loud snoring.  Headaches in the morning.  Not being able to focus your mind (concentrate).  Forgetting things.  Less interest in sex.  Mood swings.  Personality changes.  Feelings of sadness (depression).  Waking up a lot during the night to pee (urinate).  Dry mouth.  Sore throat. How is this diagnosed?  Your medical history.  A physical exam.  A test that is done when you are sleeping (sleep study). The test is most often done in a sleep lab but may also be done at home. How is this treated?   Sleeping on your side.  Using a medicine to get rid of mucus in your nose (decongestant).  Avoiding the use of alcohol, medicines to help you relax, or certain pain medicines (narcotics).  Losing weight, if needed.  Changing your diet.  Not smoking.  Using a machine to open your airway while you sleep, such as: ? An oral appliance. This is a mouthpiece that shifts your lower jaw forward. ? A CPAP device. This device blows air through a mask when you breathe out (exhale). ? An EPAP device. This has valves that you put in each nostril. ? A BPAP device. This device blows air through a mask when you breathe in (inhale) and breathe out.  Having surgery if other treatments do not work. It   is important to get treatment for sleep apnea. Without treatment, it can lead to:  High blood pressure.  Coronary artery disease.  In men, not being able to have an erection (impotence).  Reduced thinking ability. Follow these instructions at home: Lifestyle  Make changes that your doctor recommends.  Eat a healthy diet.  Lose weight if needed.  Avoid alcohol, medicines to help you relax, and some pain  medicines.  Do not use any products that contain nicotine or tobacco, such as cigarettes, e-cigarettes, and chewing tobacco. If you need help quitting, ask your doctor. General instructions  Take over-the-counter and prescription medicines only as told by your doctor.  If you were given a machine to use while you sleep, use it only as told by your doctor.  If you are having surgery, make sure to tell your doctor you have sleep apnea. You may need to bring your device with you.  Keep all follow-up visits as told by your doctor. This is important. Contact a doctor if:  The machine that you were given to use during sleep bothers you or does not seem to be working.  You do not get better.  You get worse. Get help right away if:  Your chest hurts.  You have trouble breathing in enough air.  You have an uncomfortable feeling in your back, arms, or stomach.  You have trouble talking.  One side of your body feels weak.  A part of your face is hanging down. These symptoms may be an emergency. Do not wait to see if the symptoms will go away. Get medical help right away. Call your local emergency services (911 in the [place] Do not drive yourself to the hospital. Summary  This condition affects breathing during sleep.  The most common cause is a collapsed or blocked airway.  The goal of treatment is to help you breathe normally while you sleep. This information is not intended to replace advice given to you by your health care provider. Make sure you discuss any questions you have with your health care provider. Document Revised: 06/08/2018 Document Reviewed: 04/17/2018 Elsevier Patient Education  2020 [employer]  

## 2019-11-20 NOTE — Progress Notes (Addendum)
PATIENT: Carol Parks DOB: 11-10-1970  REASON FOR VISIT: follow up HISTORY FROM: patient  Chief Complaint  Patient presents with   Follow-up    Rm 1, alone   new cpap user     HISTORY OF PRESENT ILLNESS: Today 11/21/19 Carol Parks is a 49 y.o. female here today for follow up for OSA recently started on CPAP therapy. HST in 07/2019 revealed total AHI of 21.6/hr and O2nadir of 80%. She is doing fairly well with CPAP therapy. She has tried several masks and feels that the FFM is the best fit. She continues to have difficulty sleeping on her side but feels she is adjusting. She Does note that she feels better rested when she uses CPAP.   Compliance report dated 10/20/2019 through 11/18/2019 reveals that she used CPAP 24 of the last 30 days for compliance of 80%.  23 of the last 30 days she used CPAP greater than 4 hours for compliance of 77%.  Average usage on days used was 5 hours and 57 minutes.  Residual AHI was 2.0 on 6 to 12 cm of water and an EPR of 3.  There was no significant leak noted.   HISTORY: (copied from Dr Guadelupe Sabin note on 11/14/2018)  Dear Dr. Pamella Pert,  I saw your patient, Carol Parks, upon your kind request in my sleep clinic today for initial consultation of her sleep disorder, in particular, concern for underlying obstructive sleep apnea. The patient is unaccompanied today. As you know, Carol Parks is a 49 year old right-handed woman with an underlying medical history of allergies, reflux disease, nasal polyps, anemia and obesity, who reports snoring and excessive daytime somnolence. I reviewed your office note from 10/19/2018. She lives at home with her husband and 3 children, ages 33, 30 and 70. Time is generally late, she admits that she tends to stay up late. She is in bed between 11 and 11:30, rise time is 6:30. She has a history of allergies symptoms. She grinds her teeth and has been using a bite guard for years. Her weight has been fluctuating. At one point she was  able to lose weight and she followed with weight management but no longer sees a weight management doctor. Her snoring is reportedly loud and disturbing to her husband. She denies any telltale restless leg symptoms. She is a restless sleeper. She has had some morning headaches. She has migrainous headaches infrequently. No formal migraine diagnosis from before however. She denies nocturia. She is not aware of any family history of OSA. She has recently seen ENT for her nasal congestion and concern for nasal polyps. She has a sinus CT pending soon. She is a nonsmoker, does not utilize alcohol and drinks caffeine occasionally in the form of soda, not daily. She works for [name]    REVIEW OF SYSTEMS: Out of a complete 14 system review of symptoms, the patient complains only of the following symptoms, none and all other reviewed systems are negative.  ESS: 10   ALLERGIES: Allergies  Allergen Reactions   Penicillins Other (See Comments)    Get yeast infection   Latex Rash    HOME MEDICATIONS: Outpatient Medications Prior to Visit  Medication Sig Dispense Refill   cetirizine (ZYRTEC) 10 MG chewable tablet Chew 10 mg by mouth daily.     ferrous gluconate (FERGON) 324 MG tablet Take 1 tablet (324 mg total) by mouth daily with breakfast. 90 tablet 0   fluticasone (FLONASE) 50 MCG/ACT nasal spray Place 2 sprays  into both nostrils daily. 16 g 0   Omeprazole 20 MG TBEC Take 1 tablet (20 mg total) by mouth daily before breakfast. For six weeks 30 each 1   No facility-administered medications prior to visit.    PAST MEDICAL HISTORY: Past Medical History:  Diagnosis Date   Allergy    GERD (gastroesophageal reflux disease)    H/O gestational diabetes mellitus, not currently pregnant    H/O iron deficiency anemia    OSA on CPAP     PAST SURGICAL HISTORY: Past Surgical History:  Procedure Laterality Date   BREAST REDUCTION SURGERY Bilateral    BREAST SURGERY      TUBAL LIGATION Bilateral 09/05/2013   Procedure: POST PARTUM TUBAL LIGATION;  Surgeon: Alwyn Pea, MD;  Location: [facility];  Service: Gynecology;  Laterality: Bilateral;    FAMILY HISTORY: Family History  Problem Relation Age of Onset   Hypertension Mother    Cancer Mother    Heart attack Father    Aneurysm Father    Heart attack Maternal Grandmother    Heart attack Paternal Grandmother    Heart attack Paternal Grandfather    Asthma Daughter    Mental retardation Paternal Aunt     SOCIAL HISTORY: Social History   Socioeconomic History   Marital status: Married    Spouse name: Not on file   Number of children: Not on file   Years of education: Not on file   Highest education level: Not on file  Occupational History   Not on file  Tobacco Use   Smoking status: Never Smoker   Smokeless tobacco: Never Used  Substance and Sexual Activity   Alcohol use: No    Alcohol/week: 0.0 standard drinks   Drug use: No   Sexual activity: Yes    Birth control/protection: None  Other Topics Concern   Not on file  Social History Narrative   Lives at home with husband and children.    Social Determinants of Health   Financial Resource Strain:    Difficulty of Paying Living Expenses:   Food Insecurity:    Worried About Charity fundraiser in the Last Year:    Arboriculturist in the Last Year:   Transportation Needs:    Film/video editor (Medical):    Lack of Transportation (Non-Medical):   Physical Activity:    Days of Exercise per Week:    Minutes of Exercise per Session:   Stress:    Feeling of Stress :   Social Connections:    Frequency of Communication with Friends and Family:    Frequency of Social Gatherings with Friends and Family:    Attends Religious Services:    Active Member of Clubs or Organizations:    Attends Archivist Meetings:    Marital Status:   Intimate Partner Violence:    Fear of Current or Ex-Partner:     Emotionally Abused:    Physically Abused:    Sexually Abused:       PHYSICAL EXAM  Vitals:   11/21/19 0808  BP: (!) 145/87  Pulse: 79  Temp: 97.7 F (36.5 C)  Weight: 258 lb 9.6 oz (117.3 kg)  Height: 5\' 4"  (1.626 m)   Body mass index is 44.39 kg/m.  Generalized: Well developed, in no acute distress  Cardiology: normal rate and rhythm, no murmur noted Respiratory: clear to auscultation bilaterally  Neurological examination  Mentation: Alert oriented to time, place, history taking. Follows all commands speech and language  fluent Cranial nerve II-XII: Pupils were equal round reactive to light. Extraocular movements were full, visual field were full on confrontational test. Facial sensation and strength were normal. Uvula tongue midline. Head turning and shoulder shrug  were normal and symmetric. Motor: The motor testing reveals 5 over 5 strength of all 4 extremities. Good symmetric motor tone is noted throughout.  Gait and station: Gait is normal.   DIAGNOSTIC DATA (LABS, IMAGING, TESTING) - I reviewed patient records, labs, notes, testing and imaging myself where available.  No flowsheet data found.   Lab Results  Component Value Date   WBC 10.4 07/23/2019   HGB 13.6 07/23/2019   HCT 41.3 07/23/2019   MCV 90 07/23/2019   PLT 384 07/23/2019      Component Value Date/Time   NA 140 07/23/2019 0915   K 4.7 07/23/2019 0915   CL 104 07/23/2019 0915   CO2 23 07/23/2019 0915   GLUCOSE 87 07/23/2019 0915   GLUCOSE 88 07/25/2016 1451   BUN 14 07/23/2019 0915   CREATININE 0.81 07/23/2019 0915   CREATININE 0.63 07/25/2016 1451   CALCIUM 8.7 07/23/2019 0915   PROT 6.5 07/23/2019 0915   ALBUMIN 4.0 07/23/2019 0915   AST 15 07/23/2019 0915   ALT 11 07/23/2019 0915   ALKPHOS 94 07/23/2019 0915   BILITOT 0.4 07/23/2019 0915   GFRNONAA 87 07/23/2019 0915   GFRNONAA >89 02/09/2016 0854   GFRAA 101 07/23/2019 0915   GFRAA >89 02/09/2016 0854   Lab Results    Component Value Date   CHOL 196 07/23/2019   HDL 46 07/23/2019   LDLCALC 135 (H) 07/23/2019   TRIG 80 07/23/2019   CHOLHDL 4.3 07/23/2019   Lab Results  Component Value Date   HGBA1C 5.6 07/24/2018   No results found for: [id number] Lab Results  Component Value Date   TSH 1.460 07/24/2018      ASSESSMENT AND PLAN 49 y.o. year old female  has a past medical history of Allergy, GERD (gastroesophageal reflux disease), H/O gestational diabetes mellitus, not currently pregnant, H/O iron deficiency anemia, and OSA on CPAP. here with     ICD-10-CM   1. OSA on CPAP  G47.33    Z99.89     Carol Parks is doing well with CPAP therapy. Compliance report reveals acceptable compliance. She continues to adjust to new sleeping pattern with therapy but feels she is adjusting. She was encouraged to use CPAP every night and at least 4 hours each night. Risks of untreated sleep apnea reviewed. She will follow up with me in 6 months, sooner if needed. She verbalizes understanding and agreement with this plan.    No orders of the defined types were placed in this encounter.    No orders of the defined types were placed in this encounter.     I spent 15 minutes with the patient. 50% of this time was spent counseling and educating patient on plan of care and medications.    Debbora Presto, FNP-C 11/21/2019, 8:14 AM [facility] [address] [place], [place] [address] [phone]  I reviewed the above note and documentation by the Nurse Practitioner and agree with the history, exam, assessment and plan as outlined above. I was available for consultation. Star Age, MD, PhD [facility])

## 2019-11-21 ENCOUNTER — Other Ambulatory Visit: Payer: Self-pay

## 2019-11-21 ENCOUNTER — Ambulatory Visit: Payer: 59 | Admitting: Family Medicine

## 2019-11-21 ENCOUNTER — Encounter: Payer: Self-pay | Admitting: Family Medicine

## 2019-11-21 VITALS — BP 145/87 | HR 79 | Temp 97.7°F | Ht 64.0 in | Wt 258.6 lb

## 2019-11-21 DIAGNOSIS — Z9989 Dependence on other enabling machines and devices: Secondary | ICD-10-CM

## 2019-11-21 DIAGNOSIS — G4733 Obstructive sleep apnea (adult) (pediatric): Secondary | ICD-10-CM

## 2020-01-07 ENCOUNTER — Encounter: Payer: Self-pay | Admitting: Family Medicine

## 2020-01-31 ENCOUNTER — Encounter: Payer: Self-pay | Admitting: Family Medicine

## 2020-02-24 ENCOUNTER — Other Ambulatory Visit: Payer: Self-pay

## 2020-02-24 ENCOUNTER — Ambulatory Visit
Admission: RE | Admit: 2020-02-24 | Discharge: 2020-02-24 | Disposition: A | Discharge status: Home / Self Care | Payer: 59 | Source: Ambulatory Visit | Attending: Physician Assistant | Admitting: Physician Assistant

## 2020-02-24 VITALS — BP 165/99 | HR 81 | Temp 98.9°F | Resp 18

## 2020-02-24 DIAGNOSIS — M545 Low back pain, unspecified: Secondary | ICD-10-CM

## 2020-02-24 MED ORDER — TIZANIDINE HCL 2 MG PO TABS
2.0000 mg | ORAL_TABLET | Freq: Three times a day (TID) | ORAL | 0 refills | Status: DC | PRN
Start: 2020-02-24 — End: 2020-08-20

## 2020-02-24 MED ORDER — PREDNISONE 50 MG PO TABS: 50.0000 mg | ORAL_TABLET | Freq: Every day | ORAL | 0 refills | Status: DC

## 2020-02-24 NOTE — Discharge Instructions (Signed)
Prednisone as directed. Tizanidine as needed, this can make you drowsy, so do not take if you are going to drive, operate heavy machinery, or make important decisions. Ice/heat compresses as needed. This can take up to 3-4 weeks to completely resolve, but you should be feeling better each week. Follow up with PCP/orthopedics if symptoms worsen, changes for reevaluation. If experience numbness/tingling of the inner thighs, loss of bladder or bowel control, go to the emergency department for evaluation.

## 2020-02-24 NOTE — ED Provider Notes (Signed)
[facility]    CSN: [id number] Arrival date & time: 02/24/20  1449      History   Chief Complaint Chief Complaint  Patient presents with  . Back Pain    HPI Carol Parks is a 49 y.o. female.   49 year old female comes in for 3 week history of lower back pain. Pain started after doing a lot of heavy lifting/pulling. Pain is to the midline lumbar, decreased pain at rest, worse with movement. Denies radiation of pain. Denies saddle anesthesia, loss of bladder or bowel control. Ibuprofen 400mg  QHS, heating/ice compress with some relief.      Past Medical History:  Diagnosis Date  . Allergy   . GERD (gastroesophageal reflux disease)   . H/O gestational diabetes mellitus, not currently pregnant   . H/O iron deficiency anemia   . OSA on CPAP     Patient Active Problem List   Diagnosis Date Noted  . Iron deficiency anemia due to chronic blood loss 10/19/2018  . Hiatal hernia with GERD 05/13/2017  . Latex allergy 09/03/2013  . Hx of reduction mammoplasty 09/03/2013    Past Surgical History:  Procedure Laterality Date  . BREAST REDUCTION SURGERY Bilateral   . BREAST SURGERY    . TUBAL LIGATION Bilateral 09/05/2013   Procedure: POST PARTUM TUBAL LIGATION;  Surgeon: Alwyn Pea, MD;  Location: [facility];  Service: Gynecology;  Laterality: Bilateral;    OB History    Gravida  3   Para  3   Term  3   Preterm  0   AB  0   Living  3     SAB  0   TAB  0   Ectopic  0   Multiple  0   Live Births  1            Home Medications    Prior to Admission medications   Medication Sig Start Date End Date Taking? Authorizing Provider  cetirizine (ZYRTEC) 10 MG chewable tablet Chew 10 mg by mouth daily.    [provider]  fluticasone (FLONASE) 50 MCG/ACT nasal spray Place 2 sprays into both nostrils daily. 06/29/16   Harrison Mons, PA  Omeprazole 20 MG TBEC Take 1 tablet (20 mg total) by mouth daily before breakfast. For six weeks 11/21/16    Rosemarie Ax, MD  predniSONE (DELTASONE) 50 MG tablet Take 1 tablet (50 mg total) by mouth daily with breakfast. 02/24/20   Tasia Catchings, Alexa Golebiewski V, PA-C  tiZANidine (ZANAFLEX) 2 MG tablet Take 1 tablet (2 mg total) by mouth every 8 (eight) hours as needed for muscle spasms. 02/24/20   Ok Edwards, PA-C    Family History Family History  Problem Relation Age of Onset  . Hypertension Mother   . Cancer Mother   . Heart attack Father   . Aneurysm Father   . Heart attack Maternal Grandmother   . Heart attack Paternal Grandmother   . Heart attack Paternal Grandfather   . Asthma Daughter   . Mental retardation Paternal Aunt     Social History Social History   Tobacco Use  . Smoking status: Never Smoker  . Smokeless tobacco: Never Used  Vaping Use  . Vaping Use: Never used  Substance Use Topics  . Alcohol use: No    Alcohol/week: 0.0 standard drinks  . Drug use: No     Allergies   Penicillins and Latex   Review of Systems Review of Systems  Reason unable  to perform ROS: See HPI as above.     Physical Exam Triage Vital Signs ED Triage Vitals  Enc Vitals Group     BP 02/24/20 1506 (!) 165/99     Pulse Rate 02/24/20 1506 81     Resp 02/24/20 1506 18     Temp 02/24/20 1506 98.9 F (37.2 C)     Temp Source 02/24/20 1506 Oral     SpO2 02/24/20 1506 97 %     Weight --      Height --      Head Circumference --      Peak Flow --      Pain Score 02/24/20 1507 5     Pain Loc --      Pain Edu? --      Excl. in [place]? --    No data found.  Updated Vital Signs BP (!) 165/99 (BP Location: Left Arm)   Pulse 81   Temp 98.9 F (37.2 C) (Oral)   Resp 18   LMP 01/27/2020   SpO2 97%   Physical Exam Constitutional:      General: She is not in acute distress.    Appearance: She is well-developed. She is not diaphoretic.  HENT:     Head: Normocephalic and atraumatic.  Eyes:     Conjunctiva/sclera: Conjunctivae normal.     Pupils: Pupils are equal, round, and reactive to light.    Cardiovascular:     Rate and Rhythm: Normal rate and regular rhythm.     Heart sounds: Normal heart sounds. No murmur heard.  No friction rub. No gallop.   Pulmonary:     Effort: Pulmonary effort is normal. No accessory muscle usage or respiratory distress.     Breath sounds: Normal breath sounds. No stridor. No decreased breath sounds, wheezing, rhonchi or rales.  Musculoskeletal:     Comments: No tenderness on palpation of the spinous processes. Tenderness to bilateral low lumbar region (L4-sacral region). Full range of motion of back and hips. Strength normal and equal bilaterally. Sensation intact and equal bilaterally. Negative straight leg raise.   Skin:    General: Skin is warm and dry.  Neurological:     Mental Status: She is alert and oriented to person, place, and time.      UC Treatments / Results  Labs (all labs ordered are listed, but only abnormal results are displayed) Labs Reviewed - No data to display  EKG   Radiology No results found.  Procedures Procedures (including critical care time)  Medications Ordered in UC Medications - No data to display  Initial Impression / Assessment and Plan / UC Course  I have reviewed the triage vital signs and the nursing notes.  Pertinent labs & imaging results that were available during my care of the patient were reviewed by me and considered in my medical decision making (see chart for details).    Prednisone, Muscle relaxant as needed. Ice/heat compresses. Expected course of healing discussed. Return precautions given.   Final Clinical Impressions(s) / UC Diagnoses   Final diagnoses:  Acute bilateral low back pain without sciatica   ED Prescriptions    Medication Sig Dispense Auth. Provider   predniSONE (DELTASONE) 50 MG tablet Take 1 tablet (50 mg total) by mouth daily with breakfast. 5 tablet [facility]red Heying V, PA-C   tiZANidine (ZANAFLEX) 2 MG tablet Take 1 tablet (2 mg total) by mouth every 8 (eight) hours as  needed for muscle spasms. 15 tablet Ok Edwards,  PA-C     PDMP not reviewed this encounter.   Ok Edwards, PA-C 02/24/20 1600

## 2020-02-24 NOTE — ED Triage Notes (Signed)
Pt c/o lower back pain after doing heavy lifting and pulling on 02/06/2020. Denies pain radiating. States pain is worse at night.

## 2020-05-26 ENCOUNTER — Telehealth (INDEPENDENT_AMBULATORY_CARE_PROVIDER_SITE_OTHER): Payer: 59 | Admitting: Family Medicine

## 2020-05-26 ENCOUNTER — Encounter: Payer: Self-pay | Admitting: Family Medicine

## 2020-05-26 DIAGNOSIS — Z9989 Dependence on other enabling machines and devices: Secondary | ICD-10-CM | POA: Diagnosis not present

## 2020-05-26 DIAGNOSIS — G4733 Obstructive sleep apnea (adult) (pediatric): Secondary | ICD-10-CM | POA: Diagnosis not present

## 2020-05-26 NOTE — Progress Notes (Signed)
Order for cpap supplies sent to [facility] via community msg. Confirmation received that the order transmitted was successful.  

## 2020-05-26 NOTE — Progress Notes (Addendum)
PATIENT: Carol Parks DOB: 05-12-1971  REASON FOR VISIT: follow up HISTORY FROM: patient  Virtual Visit via Telephone Note  I connected with Carol Parks on 05/26/20 at 10:00 AM EDT by telephone and verified that I am speaking with the correct person using two identifiers.   I discussed the limitations, risks, security and privacy concerns of performing an evaluation and management service by telephone and the availability of in person appointments. I also discussed with the patient that there may be a patient responsible charge related to this service. The patient expressed understanding and agreed to proceed.   History of Present Illness:  05/26/20 Carol Parks is a 49 y.o. female here today for follow up for OSA on CPAP.  She continues to do well with CPAP therapy.  She admits that she usually takes 1 day off during the week.  She does note significant improvement in daytime energy and improved sleep quality.  She denies any concerns today.  Compliance report dated 04/24/2020 through 05/23/2020 reveals that she used CPAP 24 of the past 30 days for compliance of 80%.  She used CPAP greater than 4 hours 23 of the past 30 days for compliance of 77%.  Average usage was 6 hours and 9 minutes.  Residual AHI was 1.4 on 6 to 12 cm of water and an EPR of 3.  There was no significant leak noted.   History (copied from my note on 11/21/2019)  Carol Parks is a 49 y.o. female here today for follow up for OSA recently started on CPAP therapy. HST in 07/2019 revealed total AHI of 21.6/hr and O2nadir of 80%. She is doing fairly well with CPAP therapy. She has tried several masks and feels that the FFM is the best fit. She continues to have difficulty sleeping on her side but feels she is adjusting. She Does note that she feels better rested when she uses CPAP.   Compliance report dated 10/20/2019 through 11/18/2019 reveals that she used CPAP 24 of the last 30 days for compliance of 80%.  23 of the last 30  days she used CPAP greater than 4 hours for compliance of 77%.  Average usage on days used was 5 hours and 57 minutes.  Residual AHI was 2.0 on 6 to 12 cm of water and an EPR of 3.  There was no significant leak noted.   HISTORY: (copied from Dr Guadelupe Sabin note on 11/14/2018)  Dear Dr. Pamella Parks,  I saw your patient, Carol Parks, upon your kind request in my sleep clinic today for initial consultation of her sleep disorder, in particular, concern for underlying obstructive sleep apnea. The patient is unaccompanied today. As you know, Carol Parks is a 49 year old right-handed woman with an underlying medical history of allergies, reflux disease, nasal polyps, anemia and obesity, who reports snoring and excessive daytime somnolence. I reviewed your office note from 10/19/2018. She lives at home with her husband and 3 children, ages 14, 24 and 44. Time is generally late, she admits that she tends to stay up late. She is in bed between 11 and 11:30, rise time is 6:30. She has a history of allergies symptoms. She grinds her teeth and has been using a bite guard for years. Her weight has been fluctuating. At one point she was able to lose weight and she followed with weight management but no longer sees a weight management doctor. Her snoring is reportedly loud and disturbing to her husband. She denies any telltale restless leg symptoms. She  is a restless sleeper. She has had some morning headaches. She has migrainous headaches infrequently. No formal migraine diagnosis from before however. She denies nocturia. She is not aware of any family history of OSA. She has recently seen ENT for her nasal congestion and concern for nasal polyps. She has a sinus CT pending soon. She is a nonsmoker, does not utilize alcohol and drinks caffeine occasionally in the form of soda, not daily. She works for [name]    Observations/Objective:  Generalized: Well developed, in no acute distress  Mentation: Alert  oriented to time, place, history taking. Follows all commands speech and language fluent   Assessment and Plan:  49 y.o. year old female  has a past medical history of Allergy, GERD (gastroesophageal reflux disease), H/O gestational diabetes mellitus, not currently pregnant, H/O iron deficiency anemia, and OSA on CPAP. here with    ICD-10-CM   1. OSA on CPAP  G47.33 For home use only DME continuous positive airway pressure (CPAP)   Z99.89      Carol Parks is doing well with CPAP therapy.  Compliance report reveals optimal compliance.  I have encouraged her to use CPAP every night for greater than 4 hours every night.  Healthy lifestyle habits encouraged.  She will follow-up with [place] in 1 year, sooner if needed.  She verbalizes understanding and agreement with this plan.  Orders Placed This Encounter  Procedures  . For home use only DME continuous positive airway pressure (CPAP)    Supplies    Order Specific Question:   Length of Need    Answer:   Lif[employer]    Order Specific Question:   Patient has OSA or probable OSA    Answer:   Yes    Order Specific Question:   Is the patient currently using CPAP in the home    Answer:   Yes    Order Specific Question:   Settings    Answer:   Other see comments    Order Specific Question:   CPAP supplies needed    Answer:   Mask, headgear, cushions, filters, heated tubing and water chamber    No orders of the defined types were placed in this encounter.    Follow Up Instructions:  I discussed the assessment and treatment plan with the patient. The patient was provided an opportunity to ask questions and all were answered. The patient agreed with the plan and demonstrated an understanding of the instructions.   The patient was advised to call back or seek an in-person evaluation if the symptoms worsen or if the condition fails to improve as anticipated.  I provided 15 minutes of non-face-to-face time during this encounter.  Patient is located at her  place of residence during my chart visit.  Providers in the office.   Debbora Presto, NP   I reviewed the above note and documentation by the Nurse Practitioner and agree with the history, exam, assessment and plan as outlined above. I was available for consultation. Star Age, MD, PhD [facility] [employer])

## 2020-08-20 ENCOUNTER — Other Ambulatory Visit: Payer: Self-pay

## 2020-08-20 ENCOUNTER — Ambulatory Visit (INDEPENDENT_AMBULATORY_CARE_PROVIDER_SITE_OTHER): Payer: 59 | Admitting: Podiatry

## 2020-08-20 ENCOUNTER — Encounter: Payer: Self-pay | Admitting: Podiatry

## 2020-08-20 DIAGNOSIS — M2141 Flat foot [pes planus] (acquired), right foot: Secondary | ICD-10-CM

## 2020-08-20 DIAGNOSIS — M722 Plantar fascial fibromatosis: Secondary | ICD-10-CM | POA: Diagnosis not present

## 2020-08-20 DIAGNOSIS — M2142 Flat foot [pes planus] (acquired), left foot: Secondary | ICD-10-CM

## 2020-08-22 NOTE — Progress Notes (Signed)
  Subjective:  Patient ID: Carol Parks, female    DOB: Jul 08, 1971,  MRN: [id number]  Chief Complaint  Patient presents with  . Foot Orthotics     consult for orthotics    49 y.o. female presents with the above complaint. History confirmed with patient.  She has a Parks of orthotics that she has had for about 20 years are made by a chiropractor in [place].  Has become worn down and she would like new ones made.  She is starting to get plantar fasciitis again which is what she originally had been made.  Objective:  Physical Exam: warm, good capillary refill, no trophic changes or ulcerative lesions, normal DP and PT pulses and normal sensory exam.  Bilaterally she has mild pes planovalgus with collapse of the medial arch upon weightbearing.  Minimal tenderness at the plantar medial heel bilaterally.  Mild gastrocnemius equinus.  Assessment:   1. Plantar fasciitis of right foot   2. Plantar fasciitis of left foot   3. Pes planus of both feet      Plan:  Patient was evaluated and treated and all questions answered.   Discussed treatment options for plantar fasciitis.  Orthotics have worked very well for her previously and I think we should get her new ones made to that this does not become worse for her.  She was seen by her pedorthist Velora Heckler and scanned for the orthotics today.  She will pick them up when they are ready.  We discussed the break-in.  She will continue her own stretching exercises at home for plantar fasciitis.  No follow-ups on file.

## 2020-09-21 ENCOUNTER — Other Ambulatory Visit: Payer: 59 | Admitting: Orthotics

## 2020-10-01 ENCOUNTER — Ambulatory Visit (INDEPENDENT_AMBULATORY_CARE_PROVIDER_SITE_OTHER): Payer: 59 | Admitting: Orthotics

## 2020-10-01 ENCOUNTER — Other Ambulatory Visit: Payer: Self-pay

## 2020-10-01 DIAGNOSIS — M722 Plantar fascial fibromatosis: Secondary | ICD-10-CM

## 2020-10-01 NOTE — Progress Notes (Signed)
Patient came in today to pick up custom made foot orthotics.  The goals were accomplished and the patient reported no dissatisfaction with said orthotics.  Patient was advised of breakin period and how to report any issues. 

## 2020-10-22 ENCOUNTER — Ambulatory Visit: Payer: 59 | Admitting: Physician Assistant

## 2020-11-26 ENCOUNTER — Other Ambulatory Visit: Payer: Self-pay

## 2020-11-26 ENCOUNTER — Encounter: Payer: Self-pay | Admitting: Physician Assistant

## 2020-11-26 ENCOUNTER — Ambulatory Visit: Payer: 59 | Admitting: Physician Assistant

## 2020-11-26 VITALS — BP 140/87 | HR 81 | Temp 99.0°F | Ht 63.0 in | Wt 272.1 lb

## 2020-11-26 DIAGNOSIS — K449 Diaphragmatic hernia without obstruction or gangrene: Secondary | ICD-10-CM

## 2020-11-26 DIAGNOSIS — J302 Other seasonal allergic rhinitis: Secondary | ICD-10-CM

## 2020-11-26 DIAGNOSIS — E785 Hyperlipidemia, unspecified: Secondary | ICD-10-CM

## 2020-11-26 DIAGNOSIS — K219 Gastro-esophageal reflux disease without esophagitis: Secondary | ICD-10-CM | POA: Diagnosis not present

## 2020-11-26 DIAGNOSIS — Z7689 Persons encountering health services in other specified circumstances: Secondary | ICD-10-CM | POA: Diagnosis not present

## 2020-11-26 DIAGNOSIS — D500 Iron deficiency anemia secondary to blood loss (chronic): Secondary | ICD-10-CM

## 2020-11-26 DIAGNOSIS — Z0000 Encounter for general adult medical examination without abnormal findings: Secondary | ICD-10-CM

## 2020-11-26 DIAGNOSIS — R030 Elevated blood-pressure reading, without diagnosis of hypertension: Secondary | ICD-10-CM

## 2020-11-26 NOTE — Progress Notes (Signed)
New Patient Office Visit  Subjective:  Patient ID: Carol Parks, female    DOB: 09/03/1971  Age: 50 y.o. MRN: [id number]  CC:  Chief Complaint  Patient presents with   New Patient (Initial Visit)    HPI Carol Parks presents to establish care. Patient has a past medical history of hiatal hernia with GERD, dyslipidemia, seasonal allergies and iron deficiency anemia. Surgical history is pertinent for breast reduction and tubal ligation.  Patient currently takes omeprazole 20 mg once daily for acid reflux, fluticasone nasal spray for allergies, fish oil and red yeast rice for cholesterol and multivitamin.  Patient reports she donates blood every 12 weeks and her blood pressure is checked which is usually 120s/80s, likely has whitecoat syndrome.  Denies headache, chest pain, shortness of breath, palpitations or dizziness.  Takes an iron supplement for about a week after donating blood.  Reports prior history of Covid infection January 2021.  Family history is pertinent for breast cancer (mother, diagnosed at the age of 45), heart attack, diabetes, hyperlipidemia, hypertension and stroke.  Patient has never been a smoker, denies alcohol or substance use.  Denies caffeine intake, feels like diet is fair and walks for 20 minutes twice a week.  Reports good water hydration. Has been struggling with losing weight.  Past Medical History:  Diagnosis Date   Allergy    GERD (gastroesophageal reflux disease)    H/O gestational diabetes mellitus, not currently pregnant    H/O iron deficiency anemia    OSA on CPAP     Past Surgical History:  Procedure Laterality Date   BREAST REDUCTION SURGERY Bilateral    BREAST SURGERY     TUBAL LIGATION Bilateral 09/05/2013   Procedure: POST PARTUM TUBAL LIGATION;  Surgeon: Alwyn Pea, MD;  Location: [facility];  Service: Gynecology;  Laterality: Bilateral;    Family History  Problem Relation Age of Onset   Hypertension Mother    Cancer Mother     Heart attack Father    Aneurysm Father    Stroke Father    Heart attack Maternal Grandmother    Heart attack Paternal Grandmother    Heart attack Paternal Grandfather    Asthma Daughter    Mental retardation Paternal Aunt     Social History   Socioeconomic History   Marital status: Married    Spouse name: Not on file   Number of children: Not on file   Years of education: Not on file   Highest education level: Not on file  Occupational History   Occupation: Psychiatric nurse  Tobacco Use   Smoking status: Never Smoker   Smokeless tobacco: Never Used  Scientific laboratory technician Use: Never used  Substance and Sexual Activity   Alcohol use: No    Alcohol/week: 0.0 standard drinks   Drug use: No   Sexual activity: Yes    Birth control/protection: None  Other Topics Concern   Not on file  Social History Narrative   Lives at home with husband and children.    Social Determinants of Health   Financial Resource Strain: Not on file  Food Insecurity: Not on file  Transportation Needs: Not on file  Physical Activity: Not on file  Stress: Not on file  Social Connections: Not on file  Intimate Partner Violence: Not on file    ROS Review of Systems A fourteen system review of systems was performed and found to be positive as per HPI.   Objective:   Today's Vitals: BP  140/87    Pulse 81    Temp 99 F (37.2 C)    Ht 5\' 3"  (1.6 m)    Wt 272 lb 1.6 oz (123.4 kg)    LMP 10/28/2020 (Exact Date)    SpO2 100%    BMI 48.20 kg/m   Physical Exam General:  Well Developed, well nourished, appropriate for stated age.  Neuro:  Alert and oriented,  extra-ocular muscles intact  HEENT:  Normocephalic, atraumatic, neck supple Skin:  no gross rash, warm, pink. Cardiac:  RRR, S1 S2 wnl's Respiratory:  ECTA B/L w/o wheezing, Not using accessory muscles, speaking in full sentences- unlabored. Vascular:  Ext warm, no cyanosis apprec.; cap RF less 2 sec. Psych:  No HI/SI,  judgement and insight good, Euthymic mood. Full Affect.  Assessment & Plan:   Problem List Items Addressed This Visit      Respiratory   Hiatal hernia with GERD    Other Visit Diagnoses    Encounter to establish care    -  Primary   Seasonal allergies       Morbid obesity ([place])       Dyslipidemia       Iron deficiency anemia due to chronic blood loss       White coat syndrome without diagnosis of hypertension         Hiatal hernia with GERD: -Reviewed 06/07/2017 endoscopy report which revealed moderate sized hiatal hernia. -Continue omeprazole 20 mg once daily. -Wiill continue to monitor.  Morbid obesity: -Associated with dyslipidemia. -Encourage to continue with heart healthy diet and gradually increase physical activity to ultimate goal of 150 minutes/week. -Plan to discuss management options including referral to healthy weight and wellness at follow-up visit.  Seasonal allergies: -Continue fluticasone nasal spray as needed. -Will continue to monitor.  Dyslipidemia: -Reviewed last lipid panel from 2020: Total cholesterol 196, triglycerides 80, HDL 46, LDL 135 -Will repeat lipid panel with CPE. -Recommend to continue with a heart healthy diet.  Iron deficiency anemia due to chronic blood loss: -Reviewed last CBC from 2020: Hemoglobin 13.6 hematocrit 41.3 -Continue current medication regimen.  Will repeat CBC with CPE.  White coat syndrome without diagnosis of hypertension: -BP initially elevated, BP recheck mildly improved. Reviewed prior BP readings in chart which fluctuate. Patient asymptomatic. -Recommend ambulatory BP and pulse monitoring. -Will continue to monitor.   Outpatient Encounter Medications as of 11/26/2020  Medication Sig   cetirizine (ZYRTEC) 10 MG chewable tablet Chew 10 mg by mouth daily.   fluticasone (FLONASE) 50 MCG/ACT nasal spray Place 2 sprays into both nostrils daily.   Omeprazole 20 MG TBEC Take 1 tablet (20 mg total) by mouth daily  before breakfast. For six weeks   No facility-administered encounter medications on file as of 11/26/2020.    Follow-up: Return for CPE and FBW few days prior next available .   Lorrene Reid, PA-C

## 2020-12-03 ENCOUNTER — Other Ambulatory Visit: Payer: Self-pay | Admitting: Physician Assistant

## 2020-12-03 DIAGNOSIS — E785 Hyperlipidemia, unspecified: Secondary | ICD-10-CM

## 2020-12-03 DIAGNOSIS — Z0000 Encounter for general adult medical examination without abnormal findings: Secondary | ICD-10-CM

## 2020-12-07 ENCOUNTER — Other Ambulatory Visit: Payer: 59

## 2020-12-07 ENCOUNTER — Other Ambulatory Visit: Payer: Self-pay

## 2020-12-07 DIAGNOSIS — E785 Hyperlipidemia, unspecified: Secondary | ICD-10-CM

## 2020-12-07 DIAGNOSIS — Z0000 Encounter for general adult medical examination without abnormal findings: Secondary | ICD-10-CM

## 2020-12-08 LAB — COMPREHENSIVE METABOLIC PANEL
ALT: 11 IU/L (ref 0–32)
AST: 11 IU/L (ref 0–40)
Albumin/Globulin Ratio: 1.2 (ref 1.2–2.2)
Albumin: 3.7 g/dL — ABNORMAL LOW (ref 3.8–4.8)
Alkaline Phosphatase: 85 IU/L (ref 44–121)
BUN/Creatinine Ratio: 14 (ref 9–23)
BUN: 11 mg/dL (ref 6–24)
Bilirubin Total: 0.4 mg/dL (ref 0.0–1.2)
CO2: 23 mmol/L (ref 20–29)
Calcium: 8.4 mg/dL — ABNORMAL LOW (ref 8.7–10.2)
Chloride: 104 mmol/L (ref 96–106)
Creatinine, Ser: 0.78 mg/dL (ref 0.57–1.00)
Globulin, Total: 3 g/dL (ref 1.5–4.5)
Glucose: 92 mg/dL (ref 65–99)
Potassium: 4.5 mmol/L (ref 3.5–5.2)
Sodium: 139 mmol/L (ref 134–144)
Total Protein: 6.7 g/dL (ref 6.0–8.5)
eGFR: 94 mL/min/{1.73_m2} (ref >59)

## 2020-12-08 LAB — LIPID PANEL
Chol/HDL Ratio: 3.5 ratio (ref 0.0–4.4)
Cholesterol, Total: 175 mg/dL (ref 100–199)
HDL: 50 mg/dL (ref >39)
LDL Chol Calc (NIH): 111 mg/dL — ABNORMAL HIGH (ref 0–99)
Triglycerides: 75 mg/dL (ref 0–149)
VLDL Cholesterol Cal: 14 mg/dL (ref 5–40)

## 2020-12-08 LAB — CBC
Hematocrit: 35.2 % (ref 34.0–46.6)
Hemoglobin: 11.4 g/dL (ref 11.1–15.9)
MCH: 26.3 pg — ABNORMAL LOW (ref 26.6–33.0)
MCHC: 32.4 g/dL (ref 31.5–35.7)
MCV: 81 fL (ref 79–97)
Platelets: 393 10*3/uL (ref 150–450)
RBC: 4.33 x10E6/uL (ref 3.77–5.28)
RDW: 14.4 % (ref 11.7–15.4)
WBC: 8.8 10*3/uL (ref 3.4–10.8)

## 2020-12-08 LAB — HEMOGLOBIN A1C
Est. average glucose Bld gHb Est-mCnc: 111 mg/dL
Hgb A1c MFr Bld: 5.5 % (ref 4.8–5.6)

## 2020-12-08 LAB — TSH: TSH: 1.23 u[IU]/mL (ref 0.450–4.500)

## 2020-12-10 ENCOUNTER — Encounter: Payer: Self-pay | Admitting: Physician Assistant

## 2020-12-10 ENCOUNTER — Other Ambulatory Visit: Payer: Self-pay

## 2020-12-10 ENCOUNTER — Ambulatory Visit (INDEPENDENT_AMBULATORY_CARE_PROVIDER_SITE_OTHER): Payer: 59 | Admitting: Physician Assistant

## 2020-12-10 VITALS — BP 136/82 | HR 90 | Temp 98.7°F | Ht 64.0 in | Wt 273.1 lb

## 2020-12-10 DIAGNOSIS — Z0000 Encounter for general adult medical examination without abnormal findings: Secondary | ICD-10-CM | POA: Diagnosis not present

## 2020-12-10 DIAGNOSIS — L02818 Cutaneous abscess of other sites: Secondary | ICD-10-CM

## 2020-12-10 DIAGNOSIS — Z1211 Encounter for screening for malignant neoplasm of colon: Secondary | ICD-10-CM

## 2020-12-10 NOTE — Patient Instructions (Signed)
Preventive Care 84-48 Years Old, Female Preventive care refers to lifestyle choices and visits with your health care provider that can promote health and wellness. This includes:  A yearly physical exam. This is also called an annual wellness visit.  Regular dental and eye exams.  Immunizations.  Screening for certain conditions.  Healthy lifestyle choices, such as: ? Eating a healthy diet. ? Getting regular exercise. ? Not using drugs or products that contain nicotine and tobacco. ? Limiting alcohol use. What can I expect for my preventive care visit? Physical exam Your health care provider will check your:  Height and weight. These may be used to calculate your BMI (body mass index). BMI is a measurement that tells if you are at a healthy weight.  Heart rate and blood pressure.  Body temperature.  Skin for abnormal spots. Counseling Your health care provider may ask you questions about your:  Past medical problems.  Family's medical history.  Alcohol, tobacco, and drug use.  Emotional well-being.  Home life and relationship well-being.  Sexual activity.  Diet, exercise, and sleep habits.  Work and work Statistician.  Access to firearms.  Method of birth control.  Menstrual cycle.  Pregnancy history. What immunizations do I need? Vaccines are usually given at various ages, according to a schedule. Your health care provider will recommend vaccines for you based on your age, medical history, and lifestyle or other factors, such as travel or where you work.   What tests do I need? Blood tests  Lipid and cholesterol levels. These may be checked every 5 years, or more often if you are over 3 years old.  Hepatitis C test.  Hepatitis B test. Screening  Lung cancer screening. You may have this screening every year starting at age 73 if you have a 30-pack-year history of smoking and currently smoke or have quit within the past 15 years.  Colorectal cancer  screening. ? All adults should have this screening starting at age 52 and continuing until age 17. ? Your health care provider may recommend screening at age 49 if you are at increased risk. ? You will have tests every 1-10 years, depending on your results and the type of screening test.  Diabetes screening. ? This is done by checking your blood sugar (glucose) after you have not eaten for a while (fasting). ? You may have this done every 1-3 years.  Mammogram. ? This may be done every 1-2 years. ? Talk with your health care provider about when you should start having regular mammograms. This may depend on whether you have a family history of breast cancer.  BRCA-related cancer screening. This may be done if you have a family history of breast, ovarian, tubal, or peritoneal cancers.  Pelvic exam and Pap test. ? This may be done every 3 years starting at age 10. ? Starting at age 11, this may be done every 5 years if you have a Pap test in combination with an HPV test. Other tests  STD (sexually transmitted disease) testing, if you are at risk.  Bone density scan. This is done to screen for osteoporosis. You may have this scan if you are at high risk for osteoporosis. Talk with your health care provider about your test results, treatment options, and if necessary, the need for more tests. Follow these instructions at home: Eating and drinking  Eat a diet that includes fresh fruits and vegetables, whole grains, lean protein, and low-fat dairy products.  Take vitamin and mineral supplements  as recommended by your health care provider.  Do not drink alcohol if: ? Your health care provider tells you not to drink. ? You are pregnant, may be pregnant, or are planning to become pregnant.  If you drink alcohol: ? Limit how much you have to 0-1 drink a day. ? Be aware of how much alcohol is in your drink. In the [place] one drink equals one 12 oz bottle of beer (355 mL), one 5 oz glass of  wine (148 mL), or one 1 oz glass of hard liquor (44 mL).   Lifestyle  Take daily care of your teeth and gums. Brush your teeth every morning and night with fluoride toothpaste. Floss one time each day.  Stay active. Exercise for at least 30 minutes 5 or more days each week.  Do not use any products that contain nicotine or tobacco, such as cigarettes, e-cigarettes, and chewing tobacco. If you need help quitting, ask your health care provider.  Do not use drugs.  If you are sexually active, practice safe sex. Use a condom or other form of protection to prevent STIs (sexually transmitted infections).  If you do not wish to become pregnant, use a form of birth control. If you plan to become pregnant, see your health care provider for a prepregnancy visit.  If told by your health care provider, take low-dose aspirin daily starting at age 50.  Find healthy ways to cope with stress, such as: ? Meditation, yoga, or listening to music. ? Journaling. ? Talking to a trusted person. ? Spending time with friends and family. Safety  Always wear your seat belt while driving or riding in a vehicle.  Do not drive: ? If you have been drinking alcohol. Do not ride with someone who has been drinking. ? When you are tired or distracted. ? While texting.  Wear a helmet and other protective equipment during sports activities.  If you have firearms in your house, make sure you follow all gun safety procedures. What's next?  Visit your health care provider once a year for an annual wellness visit.  Ask your health care provider how often you should have your eyes and teeth checked.  Stay up to date on all vaccines. This information is not intended to replace advice given to you by your health care provider. Make sure you discuss any questions you have with your health care provider. Document Revised: 05/26/2020 Document Reviewed: 05/03/2018 Elsevier Patient Education  2021 Elsevier Inc.  

## 2020-12-10 NOTE — Progress Notes (Signed)
Subjective:     Carol Parks is a 50 y.o. female and is here for a comprehensive physical exam. The patient reports no problems.  Social History   Socioeconomic History  . Marital status: Married    Spouse name: Not on file  . Number of children: Not on file  . Years of education: Not on file  . Highest education level: Not on file  Occupational History  . Occupation: Psychiatric nurse  Tobacco Use  . Smoking status: Never Smoker  . Smokeless tobacco: Never Used  Vaping Use  . Vaping Use: Never used  Substance and Sexual Activity  . Alcohol use: No    Alcohol/week: 0.0 standard drinks  . Drug use: No  . Sexual activity: Yes    Birth control/protection: None  Other Topics Concern  . Not on file  Social History Narrative   Lives at home with husband and children.    Social Determinants of Health   Financial Resource Strain: Not on file  Food Insecurity: Not on file  Transportation Needs: Not on file  Physical Activity: Not on file  Stress: Not on file  Social Connections: Not on file  Intimate Partner Violence: Not on file   Health Maintenance  Topic Date Due  . Hepatitis C Screening  Never done  . COLONOSCOPY (Pts 45-71yrs Insurance coverage will need to be confirmed)  Never done  . COVID-19 Vaccine (3 - Booster for Moderna series) 12/12/2020 (Originally 06/10/2020)  . INFLUENZA VACCINE  04/05/2021  . PAP SMEAR-Modifier  07/25/2022  . TETANUS/TDAP  07/24/2028  . HIV Screening  Completed  . HPV VACCINES  Aged Out    The following portions of the patient's history were reviewed and updated as appropriate: allergies, current medications, past family history, past medical history, past social history, past surgical history and problem list.  Review of Systems Pertinent items noted in HPI and remainder of comprehensive ROS otherwise negative.   Objective:    BP 136/82   Pulse 90   Temp 98.7 F (37.1 C)   Ht 5\' 4"  (1.626 m)   Wt 273 lb 1.6 oz (123.9 kg)    LMP 11/22/2020   SpO2 100%   BMI 46.88 kg/m  General appearance: alert, cooperative, no distress and morbidly obese Head: Normocephalic, without obvious abnormality, atraumatic Eyes: conjunctivae/corneas clear. PERRL, EOM's intact. Fundi benign. Ears: normal TM's and external ear canals both ears Nose: Nares normal. Septum midline. Mucosa normal. No drainage or sinus tenderness. Throat: lips, mucosa, and tongue normal; teeth and gums normal Neck: no adenopathy, no JVD, supple, symmetrical, trachea midline and thyroid not enlarged, symmetric, no tenderness/mass/nodules Back: symmetric, no curvature. ROM normal. No CVA tenderness. Lungs: clear to auscultation bilaterally Heart: regular rate and rhythm, S1, S2 normal, no murmur, click, rub or gallop Abdomen: soft, non-tender; bowel sounds normal; no masses,  no organomegaly Extremities: edema trace  and no ulcers, gangrene or trophic changes Pulses: 2+ and symmetric Skin: mobility and turgor normal or small cutaneous abscess of left groin Lymph nodes: Cervical adenopathy: normal and Supraclavicular adenopathy: normal Neurologic: Grossly normal    Assessment:    Healthy female exam.     Plan:  -Discussed most recent lab results which are essentially within normal limits or stable from prior with exception of mildly decreased calcium. Will repeat CMP in 2 weeks and additional labs for further work up (PTH, ionized ca, phosphorus, magnesium, vit d). -UTD on pap smear, Tdap, influenza and covid vaccines. Declined Hep C and HIV  screenings. Obtains mammogram through mobile unit at work in July and advised to forward lab results.  -Will place order for screening colonoscopy. -Advised to apply warm compresses of small skin abscess and if no improvement or worsens then recommend trial oral antibiotic or I&D. Patient verbalized understanding.  -Recommend to follow a heart healthy diet and stay well hydrated.  -Follow up in 1 year for CPE and  FBW  See After Visit Summary for Counseling Recommendations

## 2020-12-28 ENCOUNTER — Other Ambulatory Visit: Payer: 59

## 2020-12-28 ENCOUNTER — Other Ambulatory Visit: Payer: Self-pay

## 2020-12-29 ENCOUNTER — Telehealth: Payer: Self-pay | Admitting: Physician Assistant

## 2020-12-29 LAB — COMPREHENSIVE METABOLIC PANEL
ALT: 19 IU/L (ref 0–32)
AST: 15 IU/L (ref 0–40)
Albumin/Globulin Ratio: 1.6 (ref 1.2–2.2)
Albumin: 4 g/dL (ref 3.8–4.8)
Alkaline Phosphatase: 88 IU/L (ref 44–121)
BUN/Creatinine Ratio: 15 (ref 9–23)
BUN: 12 mg/dL (ref 6–24)
Bilirubin Total: 0.3 mg/dL (ref 0.0–1.2)
CO2: 23 mmol/L (ref 20–29)
Calcium: 8.8 mg/dL (ref 8.7–10.2)
Chloride: 105 mmol/L (ref 96–106)
Creatinine, Ser: 0.78 mg/dL (ref 0.57–1.00)
Globulin, Total: 2.5 g/dL (ref 1.5–4.5)
Glucose: 98 mg/dL (ref 65–99)
Potassium: 4.7 mmol/L (ref 3.5–5.2)
Sodium: 140 mmol/L (ref 134–144)
Total Protein: 6.5 g/dL (ref 6.0–8.5)
eGFR: 94 mL/min/{1.73_m2} (ref >59)

## 2020-12-29 LAB — PHOSPHORUS: Phosphorus: 2.8 mg/dL — ABNORMAL LOW (ref 3.0–4.3)

## 2020-12-29 LAB — VITAMIN D 25 HYDROXY (VIT D DEFICIENCY, FRACTURES): Vit D, 25-Hydroxy: 32.8 ng/mL (ref 30.0–100.0)

## 2020-12-29 LAB — CALCIUM, IONIZED: Calcium, Ion: 4.9 mg/dL (ref 4.5–5.6)

## 2020-12-29 LAB — MAGNESIUM: Magnesium: 1.9 mg/dL (ref 1.6–2.3)

## 2020-12-29 NOTE — Telephone Encounter (Signed)
Labs have been added along with new dx codes. AS, CMA

## 2020-12-29 NOTE — Telephone Encounter (Signed)
Per [facility] specimen no longer available. AS, CMA

## 2020-12-29 NOTE — Telephone Encounter (Signed)
-----   Message from Lorrene Reid, [place] sent at 12/29/2020 10:52 AM EDT ----- Please contact [facility] and see if intact parathyroid (PTH) can be added. Dx: hypocalcemia E83.51, hypophosphatemia E83.39  Thank you, Herb Grays

## 2020-12-30 ENCOUNTER — Telehealth: Payer: Self-pay | Admitting: Physician Assistant

## 2020-12-30 NOTE — Telephone Encounter (Signed)
Patient is returning your call, she will not be available on tomorrow, 4/28, asked if you can please call her on Friday. Thank you

## 2021-01-01 ENCOUNTER — Encounter: Payer: Self-pay | Admitting: Internal Medicine

## 2021-01-01 ENCOUNTER — Telehealth: Payer: Self-pay | Admitting: Physician Assistant

## 2021-01-01 NOTE — Telephone Encounter (Signed)
Spoke with the gastro office, they are behind about 30 days for reaching out to patients. Jessica at the office is calling patient today to schedule. Thank you

## 2021-01-01 NOTE — Telephone Encounter (Signed)
Referral to Gastro placed 12/10/20. Pt has not heard from [place] office. Can you please check into the status of this referral? Thank you

## 2021-02-09 ENCOUNTER — Encounter: Payer: 59 | Admitting: Internal Medicine

## 2021-04-07 ENCOUNTER — Other Ambulatory Visit: Payer: Self-pay

## 2021-04-07 ENCOUNTER — Ambulatory Visit (AMBULATORY_SURGERY_CENTER): Payer: 59

## 2021-04-07 VITALS — Ht 64.0 in | Wt 272.0 lb

## 2021-04-07 DIAGNOSIS — Z1211 Encounter for screening for malignant neoplasm of colon: Secondary | ICD-10-CM

## 2021-04-07 MED ORDER — NA SULFATE-K SULFATE-MG SULF 17.5-3.13-1.6 GM/177ML PO SOLN
1.0000 | Freq: Once | ORAL | 0 refills | Status: AC
Start: 2021-04-07 — End: 2021-04-07

## 2021-04-07 NOTE — Progress Notes (Signed)
Pre visit completed via phone call; Patient verified name, DOB, and address;No egg or soy allergy known to patient  No issues with past sedation with any surgeries or procedures Patient denies ever being told they had issues or difficulty with intubation  No FH of Malignant Hyperthermia No diet pills per patient No home 02 use per patient  No blood thinners per patient  Pt denies issues with constipation  No A fib or A flutter  EMMI video via MyChart  COVID 19 guidelines implemented in PV today with Pt and RN  Pt is fully vaccinated for Covid   NO PA's for preps discussed with pt in PV today  Discussed with pt there will be an out-of-pocket cost for prep and that varies from $0 to 70 dollars   Due to the COVID-19 pandemic we are asking patients to follow certain guidelines.  Pt aware of COVID protocols and [facility] guidelines

## 2021-04-22 ENCOUNTER — Ambulatory Visit (AMBULATORY_SURGERY_CENTER): Payer: 59 | Admitting: Gastroenterology

## 2021-04-22 ENCOUNTER — Encounter: Payer: Self-pay | Admitting: Gastroenterology

## 2021-04-22 ENCOUNTER — Other Ambulatory Visit: Payer: Self-pay

## 2021-04-22 VITALS — BP 148/79 | HR 77 | Temp 98.9°F | Resp 15 | Ht 64.0 in | Wt 272.0 lb

## 2021-04-22 DIAGNOSIS — D123 Benign neoplasm of transverse colon: Secondary | ICD-10-CM | POA: Diagnosis not present

## 2021-04-22 DIAGNOSIS — D122 Benign neoplasm of ascending colon: Secondary | ICD-10-CM | POA: Diagnosis not present

## 2021-04-22 DIAGNOSIS — Z1211 Encounter for screening for malignant neoplasm of colon: Secondary | ICD-10-CM | POA: Diagnosis not present

## 2021-04-22 MED ORDER — SODIUM CHLORIDE 0.9 % IV SOLN: 500.0000 mL | Freq: Once | INTRAVENOUS | Status: DC

## 2021-04-22 NOTE — Patient Instructions (Signed)
Handouts given:  polyps, hemorroids Resume previous diet Continue current medications  YOU HAD AN ENDOSCOPIC PROCEDURE TODAY AT [facility]:   Refer to the procedure report that was given to you for any specific questions about what was found during the examination.  If the procedure report does not answer your questions, please call your gastroenterologist to clarify.  If you requested that your care partner not be given the details of your procedure findings, then the procedure report has been included in a sealed envelope for you to review at your convenience later.  YOU SHOULD EXPECT: Some feelings of bloating in the abdomen. Passage of more gas than usual.  Walking can help get rid of the air that was put into your GI tract during the procedure and reduce the bloating. If you had a lower endoscopy (such as a colonoscopy or flexible sigmoidoscopy) you may notice spotting of blood in your stool or on the toilet paper. If you underwent a bowel prep for your procedure, you may not have a normal bowel movement for a few days.  Please Note:  You might notice some irritation and congestion in your nose or some drainage.  This is from the oxygen used during your procedure.  There is no need for concern and it should clear up in a day or so.  SYMPTOMS TO REPORT IMMEDIATELY:  Following lower endoscopy (colonoscopy or flexible sigmoidoscopy):  Excessive amounts of blood in the stool  Significant tenderness or worsening of abdominal pains  Swelling of the abdomen that is new, acute  Fever of 100F or higher  For urgent or emergent issues, a gastroenterologist can be reached at any hour by calling [phone]. Do not use MyChart messaging for urgent concerns.   DIET:  We do recommend a small meal at first, but then you may proceed to your regular diet.  Drink plenty of fluids but you should avoid alcoholic beverages for 24 hours.  ACTIVITY:  You should plan to take it easy for the  rest of today and you should NOT DRIVE or use heavy machinery until tomorrow (because of the sedation medicines used during the test).    FOLLOW UP: Our staff will call the number listed on your records 48-72 hours following your procedure to check on you and address any questions or concerns that you may have regarding the information given to you following your procedure. If we do not reach you, we will leave a message.  We will attempt to reach you two times.  During this call, we will ask if you have developed any symptoms of COVID 19. If you develop any symptoms (ie: fever, flu-like symptoms, shortness of breath, cough etc.) before then, please call [phone].  If you test positive for Covid 19 in the 2 weeks post procedure, please call and report this information to [place].    If any biopsies were taken you will be contacted by phone or by letter within the next 1-3 weeks.  Please call us at [phone] if you have not heard about the biopsies in 3 weeks.   SIGNATURES/CONFIDENTIALITY: You and/or your care partner have signed paperwork which will be entered into your electronic medical record.  These signatures attest to the fact that that the information above on your After Visit Summary has been reviewed and is understood.  Full responsibility of the confidentiality of this discharge information lies with you and/or your care-partner.

## 2021-04-22 NOTE — Progress Notes (Signed)
Pt's states no medical or surgical changes since previsit or office visit. VS assessed by C.W 

## 2021-04-22 NOTE — Progress Notes (Signed)
To pacu, VSS. Report to [email or web address] 

## 2021-04-22 NOTE — Op Note (Signed)
[facility] Patient Name: Carol Parks Procedure Date: 04/22/2021 10:35 AM MRN: [id number] Endoscopist: Remo Lipps P. Havery Moros , MD Age: 50 Referring MD:  Date of Birth: 06-09-1971 Gender: Female Account #: [id number] Procedure:                Colonoscopy Indications:              Screening for colorectal malignant neoplasm, This                            is the patient's first colonoscopy Medicines:                Monitored Anesthesia Care Procedure:                Pre-Anesthesia Assessment:                           - Prior to the procedure, a History and Physical                            was performed, and patient medications and                            allergies were reviewed. The patient's tolerance of                            previous anesthesia was also reviewed. The risks                            and benefits of the procedure and the sedation                            options and risks were discussed with the patient.                            All questions were answered, and informed consent                            was obtained. Prior Anticoagulants: The patient has                            taken no previous anticoagulant or antiplatelet                            agents. ASA Grade Assessment: III - A patient with                            severe systemic disease. After reviewing the risks                            and benefits, the patient was deemed in                            satisfactory condition to undergo the procedure.  After obtaining informed consent, the colonoscope                            was passed under direct vision. Throughout the                            procedure, the patient's blood pressure, pulse, and                            oxygen saturations were monitored continuously. The                            Olympus CF-HQ190L [id number]) Colonoscope was                            introduced through the  anus and advanced to the the                            cecum, identified by appendiceal orifice and                            ileocecal valve. The colonoscopy was performed                            without difficulty. The patient tolerated the                            procedure well. The quality of the bowel                            preparation was good. The ileocecal valve,                            appendiceal orifice, and rectum were photographed. Scope In: 10:41:42 AM Scope Out: 10:57:42 AM Scope Withdrawal Time: 0 hours 13 minutes 30 seconds  Total Procedure Duration: 0 hours 16 minutes 0 seconds  Findings:                 Skin tags were found on perianal exam.                           A 4 mm polyp was found in the ascending colon. The                            polyp was flat. The polyp was removed with a cold                            snare. Resection and retrieval were complete.                           A 3 mm polyp was found in the transverse colon. The                            polyp was  sessile. The polyp was removed with a                            cold snare. Resection and retrieval were complete.                           Internal hemorrhoids were found during retroflexion.                           The exam was otherwise without abnormality. Complications:            No immediate complications. Estimated blood loss:                            Minimal. Estimated Blood Loss:     Estimated blood loss was minimal. Impression:               - Perianal skin tags found on perianal exam.                           - One 4 mm polyp in the ascending colon, removed                            with a cold snare. Resected and retrieved.                           - One 3 mm polyp in the transverse colon, removed                            with a cold snare. Resected and retrieved.                           - Internal hemorrhoids.                           - The examination  was otherwise normal. Recommendation:           - Patient has a contact number available for                            emergencies. The signs and symptoms of potential                            delayed complications were discussed with the                            patient. Return to normal activities tomorrow.                            Written discharge instructions were provided to the                            patient.                           - Resume previous diet.                           -  Continue present medications.                           - Await pathology results. Remo Lipps P. Aydn Ferrara, MD 04/22/2021 11:02:02 AM This report has been signed electronically.

## 2021-04-22 NOTE — Progress Notes (Signed)
[facility] History and Physical   Primary Care Physician:  Lorrene Reid, PA-C   Reason for Procedure:   Colon cancer screening  Plan:    colonoscopy     HPI: Carol Parks is a 50 y.o. female here for first time colon cancer screening. No bowel habit changes. No FH of colon cancer. Otherwise feels well without complaints.   Past Medical History:  Diagnosis Date   GERD (gastroesophageal reflux disease)    H/O gestational diabetes mellitus, not currently pregnant    H/O iron deficiency anemia    due to donating blood   Seasonal allergies    Sleep apnea    uses CPAP    Past Surgical History:  Procedure Laterality Date   REDUCTION MAMMAPLASTY Bilateral 1991   TUBAL LIGATION Bilateral 09/05/2013   Procedure: POST PARTUM TUBAL LIGATION;  Surgeon: Alwyn Pea, MD;  Location: [facility];  Service: Gynecology;  Laterality: Bilateral;   WISDOM TOOTH EXTRACTION      Prior to Admission medications   Medication Sig Start Date End Date Taking? Authorizing Provider  cetirizine (ZYRTEC) 10 MG chewable tablet Chew 10 mg by mouth daily as needed.   Yes [provider]  Cholecalciferol (VITAMIN D3 PO) Take 1 tablet by mouth daily at 6 (six) AM.   Yes [provider]  fluticasone (FLONASE) 50 MCG/ACT nasal spray Place 2 sprays into both nostrils daily. 06/29/16  Yes Harrison Mons, PA  Multiple Vitamin (MULTIVITAMIN PO) Take 1 tablet by mouth daily at 6 (six) AM.   Yes [provider]  Omega-3 Fatty Acids (FISH OIL PO) Take 1 capsule by mouth daily at 6 (six) AM.   Yes [provider]  Omeprazole 20 MG TBEC Take 1 tablet (20 mg total) by mouth daily before breakfast. For six weeks 11/21/16  Yes Rosemarie Ax, MD  Red Yeast Rice Extract (RED YEAST RICE PO) Take 1 capsule by mouth daily at 6 (six) AM.   Yes [provider]    Current Outpatient Medications  Medication Sig Dispense Refill   cetirizine (ZYRTEC) 10 MG chewable  tablet Chew 10 mg by mouth daily as needed.     Cholecalciferol (VITAMIN D3 PO) Take 1 tablet by mouth daily at 6 (six) AM.     fluticasone (FLONASE) 50 MCG/ACT nasal spray Place 2 sprays into both nostrils daily. 16 g 0   Multiple Vitamin (MULTIVITAMIN PO) Take 1 tablet by mouth daily at 6 (six) AM.     Omega-3 Fatty Acids (FISH OIL PO) Take 1 capsule by mouth daily at 6 (six) AM.     Omeprazole 20 MG TBEC Take 1 tablet (20 mg total) by mouth daily before breakfast. For six weeks 30 each 1   Red Yeast Rice Extract (RED YEAST RICE PO) Take 1 capsule by mouth daily at 6 (six) AM.     Current Facility-Administered Medications  Medication Dose Route Frequency Provider Last Rate Last Admin   0.9 %  sodium chloride infusion  500 mL Intravenous Once Abi Shoults, Carlota Raspberry, MD        Allergies as of 04/22/2021 - Review Complete 04/22/2021  Allergen Reaction Noted   Penicillins Other (See Comments) 05/24/2012   Latex Rash 05/24/2012    Family History  Problem Relation Age of Onset   Hypertension Mother    Cancer Mother    Heart attack Father    Aneurysm Father    Stroke Father    Mental retardation Paternal Aunt  Heart attack Maternal Grandmother    Heart attack Paternal Grandmother    Heart attack Paternal Grandfather    Asthma Daughter    Colon polyps Neg Hx    Colon cancer Neg Hx    Esophageal cancer Neg Hx    Rectal cancer Neg Hx    Stomach cancer Neg Hx     Social History   Socioeconomic History   Marital status: Married    Spouse name: Not on file   Number of children: Not on file   Years of education: Not on file   Highest education level: Not on file  Occupational History   Occupation: Psychiatric nurse  Tobacco Use   Smoking status: Never   Smokeless tobacco: Never  Vaping Use   Vaping Use: Never used  Substance and Sexual Activity   Alcohol use: No    Alcohol/week: 0.0 standard drinks   Drug use: No   Sexual activity: Yes    Birth control/protection:  None  Other Topics Concern   Not on file  Social History Narrative   Lives at home with husband and children.    Social Determinants of Health   Financial Resource Strain: Not on file  Food Insecurity: Not on file  Transportation Needs: Not on file  Physical Activity: Not on file  Stress: Not on file  Social Connections: Not on file  Intimate Partner Violence: Not on file    Review of Systems: All other review of systems negative except as mentioned in the HPI.  Physical Exam: Vital signs BP (!) 130/95   Pulse 98   Temp 98.9 F (37.2 C) (Skin)   Ht '5\' 4"'$  (1.626 m)   Wt 272 lb (123.4 kg)   SpO2 100%   BMI 46.69 kg/m   General:   Alert,  Well-developed, well-nourished, pleasant and cooperative in NAD Lungs:  Clear throughout to auscultation.   Heart:  Regular rate and rhythm Abdomen:  Soft, nontender and nondistended.  Neuro/Psych:  Alert and cooperative. Normal mood and affect. A and O x 3  Jolly Mango, MD [facility]

## 2021-04-22 NOTE — Progress Notes (Signed)
Called to room to assist during endoscopic procedure.  Patient ID and intended procedure confirmed with present staff. Received instructions for my participation in the procedure from the performing physician.  

## 2021-04-26 ENCOUNTER — Telehealth: Payer: Self-pay

## 2021-04-26 NOTE — Telephone Encounter (Signed)
  Follow up Call-  Call back number 04/22/2021  Post procedure Call Back phone  # [phone]  Permission to leave phone message Yes  Some recent data might be hidden     Patient questions:  Do you have a fever, pain , or abdominal swelling? No. Pain Score  0 *  Have you tolerated food without any problems? Yes.    Have you been able to return to your normal activities? Yes.    Do you have any questions about your discharge instructions: Diet   No. Medications  No. Follow up visit  No.  Do you have questions or concerns about your Care? No.  Actions: * If pain score is 4 or above: No action needed, pain <4.

## 2021-05-31 DIAGNOSIS — G4733 Obstructive sleep apnea (adult) (pediatric): Secondary | ICD-10-CM | POA: Insufficient documentation

## 2021-05-31 DIAGNOSIS — Z9989 Dependence on other enabling machines and devices: Secondary | ICD-10-CM | POA: Insufficient documentation

## 2021-05-31 NOTE — Patient Instructions (Addendum)
Please continue using your CPAP regularly. While your insurance requires that you use CPAP at least 4 hours each night on 70% of the nights, I recommend, that you not skip any nights and use it throughout the night if you can. Getting used to CPAP and staying with the treatment long term does take time and patience and discipline. Untreated obstructive sleep apnea when it is moderate to severe can have an adverse impact on cardiovascular health and raise her risk for heart disease, arrhythmias, hypertension, congestive heart failure, stroke and diabetes. Untreated obstructive sleep apnea causes sleep disruption, nonrestorative sleep, and sleep deprivation. This can have an impact on your day to day functioning and cause daytime sleepiness and impairment of cognitive function, memory loss, mood disturbance, and problems focussing. Using CPAP regularly can improve these symptoms.   Call DME to inquire about any new mask options if you continue to have difficulty with headgear. Work on improving compliance both daily and four hour to a minimum of 70%.

## 2021-05-31 NOTE — Progress Notes (Signed)
PATIENT: Carol Parks DOB: [birth year]  REASON FOR VISIT: follow up HISTORY FROM: patient  Virtual Visit via Telephone Note  I connected with Carol Parks on 06/01/21 at  7:45 AM EDT by telephone and verified that I am speaking with the correct person using two identifiers.   I discussed the limitations, risks, security and privacy concerns of performing an evaluation and management service by telephone and the availability of in person appointments. I also discussed with the patient that there may be a patient responsible charge related to this service. The patient expressed understanding and agreed to proceed.   History of Present Illness:  06/01/21 ALL: She returns for follow up for OSA on CPAP. She is doing fairly well with CPAP therapy. She had Covid mid September and was unable to use her machine for about a week. She continues to take a therapy holiday on Friday nights. She is using a FFM. She has some intermittent tenderness of the back of her head from the headgear. She has tried multiple masks and feels this is the best option. She has been disappointed about not being able to lose weight.     05/26/2020 ALL:  Carol Parks is a 50 y.o. female here today for follow up for OSA on CPAP.  She continues to do well with CPAP therapy.  She admits that she usually takes 1 day off during the week.  She does note significant improvement in daytime energy and improved sleep quality.  She denies any concerns today.  Compliance report dated 04/24/2020 through 05/23/2020 reveals that she used CPAP 24 of the past 30 days for compliance of 80%.  She used CPAP greater than 4 hours 23 of the past 30 days for compliance of 77%.  Average usage was 6 hours and 9 minutes.  Residual AHI was 1.4 on 6 to 12 cm of water and an EPR of 3.  There was no significant leak noted.  History (copied from my note on 11/21/2019)  Carol Parks is a 50 y.o. female here today for follow up for OSA recently started on CPAP  therapy. HST in 07/2019 revealed total AHI of 21.6/hr and O2nadir of 80%. She is doing fairly well with CPAP therapy. She has tried several masks and feels that the FFM is the best fit. She continues to have difficulty sleeping on her side but feels she is adjusting. She Does note that she feels better rested when she uses CPAP.    Compliance report dated 10/20/2019 through 11/18/2019 reveals that she used CPAP 24 of the last 30 days for compliance of 80%.  23 of the last 30 days she used CPAP greater than 4 hours for compliance of 77%.  Average usage on days used was 5 hours and 57 minutes.  Residual AHI was 2.0 on 6 to 12 cm of water and an EPR of 3.  There was no significant leak noted.     HISTORY: (copied from Dr Guadelupe Sabin note on 11/14/2018)   Dear Dr. Pamella Pert,   I saw your patient, Carol Parks, upon your kind request in my sleep clinic today for initial consultation of her sleep disorder, in particular, concern for underlying obstructive sleep apnea. The patient is unaccompanied today. As you know, Carol Parks is a 50 year old right-handed woman with an underlying medical history of allergies, reflux disease, nasal polyps, anemia and obesity, who reports snoring and excessive daytime somnolence. I reviewed your office note from 10/19/2018. She lives at home with her husband and  3 children, ages 75, 55 and 84. Time is generally late, she admits that she tends to stay up late. She is in bed between 11 and 11:30, rise time is 6:30. She has a history of allergies symptoms. She grinds her teeth and has been using a bite guard for years. Her weight has been fluctuating. At one point she was able to lose weight and she followed with weight management but no longer sees a weight management doctor. Her snoring is reportedly loud and disturbing to her husband. She denies any telltale restless leg symptoms. She is a restless sleeper. She has had some morning headaches. She has migrainous headaches infrequently. No  formal migraine diagnosis from before however. She denies nocturia. She is not aware of any family history of OSA. She has recently seen ENT for her nasal congestion and concern for nasal polyps. She has a sinus CT pending soon. She is a nonsmoker, does not utilize alcohol and drinks caffeine occasionally in the form of soda, not daily. She works for [name]   Observations/Objective:  Generalized: Well developed, in no acute distress  Mentation: Alert oriented to time, place, history taking. Follows all commands speech and language fluent   Assessment and Plan:  50 y.o. year old female  has a past medical history of GERD (gastroesophageal reflux disease), H/O gestational diabetes mellitus, not currently pregnant, H/O iron deficiency anemia, Seasonal allergies, and Sleep apnea. here with    ICD-10-CM   1. OSA on CPAP  G47.33 For home use only DME continuous positive airway pressure (CPAP)   Z99.89        Carol Parks is doing well with CPAP therapy.  Compliance report reveals 72% daily and 69% four hour usage. I have encouraged her to use CPAP every night for greater than 4 hours every night. I will update supply orders. She may continue to work with DME if new mask option becomes available. Healthy lifestyle habits encouraged. Consider low carb diet. She will follow-up with [place] in 4 months, sooner if needed.  She verbalizes understanding and agreement with this plan.  Orders Placed This Encounter  Procedures   For home use only DME continuous positive airway pressure (CPAP)    Supplies    Order Specific Question:   Length of Need    Answer:   Lifetime    Order Specific Question:   Patient has OSA or probable OSA    Answer:   Yes    Order Specific Question:   Is the patient currently using CPAP in the home    Answer:   Yes    Order Specific Question:   Settings    Answer:   Other see comments    Order Specific Question:   CPAP supplies needed    Answer:   Mask, headgear,  cushions, filters, heated tubing and water chamber     No orders of the defined types were placed in this encounter.    Follow Up Instructions:  I discussed the assessment and treatment plan with the patient. The patient was provided an opportunity to ask questions and all were answered. The patient agreed with the plan and demonstrated an understanding of the instructions.   The patient was advised to call back or seek an in-person evaluation if the symptoms worsen or if the condition fails to improve as anticipated.  I provided 15 minutes of non-face-to-face time during this encounter.  Patient is located at her place of residence during my chart visit.  Providers in the office.   Debbora Presto, NP

## 2021-06-01 ENCOUNTER — Telehealth (INDEPENDENT_AMBULATORY_CARE_PROVIDER_SITE_OTHER): Payer: 59 | Admitting: Family Medicine

## 2021-06-01 ENCOUNTER — Encounter: Payer: Self-pay | Admitting: Family Medicine

## 2021-06-01 DIAGNOSIS — G4733 Obstructive sleep apnea (adult) (pediatric): Secondary | ICD-10-CM | POA: Diagnosis not present

## 2021-06-01 DIAGNOSIS — Z9989 Dependence on other enabling machines and devices: Secondary | ICD-10-CM | POA: Diagnosis not present

## 2021-06-09 LAB — HM MAMMOGRAPHY: HM Mammogram: NORMAL (ref 0–4)

## 2021-06-17 ENCOUNTER — Encounter: Payer: Self-pay | Admitting: Physician Assistant

## 2021-06-17 LAB — HM MAMMOGRAPHY: HM Mammogram: NORMAL (ref 0–4)

## 2021-07-06 ENCOUNTER — Ambulatory Visit: Payer: 59 | Admitting: Podiatry

## 2021-07-06 ENCOUNTER — Other Ambulatory Visit: Payer: Self-pay

## 2021-07-06 DIAGNOSIS — M722 Plantar fascial fibromatosis: Secondary | ICD-10-CM | POA: Diagnosis not present

## 2021-07-06 NOTE — Patient Instructions (Signed)

## 2021-07-07 NOTE — Progress Notes (Signed)
  Subjective:  Patient ID: Carol Parks, female    DOB: 06/04/1971,  MRN: [id number]  Chief Complaint  Patient presents with   Plantar Fasciitis     right foot PF flare up    50 y.o. female presents with the above complaint. History confirmed with patient.  Overall doing well her orthotics are very comfortable but she has had a flare of heel pain that she has not been able to get over the last few months  Objective:  Physical Exam: warm, good capillary refill, no trophic changes or ulcerative lesions, normal DP and PT pulses and normal sensory exam.  Bilaterally she has mild pes planovalgus with collapse of the medial arch upon weightbearing.  Today she has sharp pain at the medial calcaneal tubercle at the insertion of plantar fashion on the right foot.  Mild gastrocnemius equinus.  Assessment:   1. Plantar fasciitis of right foot       Plan:  Patient was evaluated and treated and all questions answered.   Continue orthotics.  We discussed injection stretching and therapy exercise.  She will continue OTC NSAIDs, gave her therapeutic home exercise plan and recommended injection today.  This was done as noted below.  Return to see me as needed  After sterile prep with povidone-iodine solution and alcohol, the right heel was injected with 0.5cc 2% xylocaine plain, 0.5cc 0.5% marcaine plain, 5mg  triamcinolone acetonide, and 2mg  dexamethasone was injected along the medial plantar fascia at the insertion on the plantar calcaneus. The patient tolerated the procedure well without complication.   Return if symptoms worsen or fail to improve.

## 2021-07-13 ENCOUNTER — Encounter: Payer: Self-pay | Admitting: Family Medicine

## 2021-10-05 ENCOUNTER — Telehealth: Payer: 59 | Admitting: Family Medicine

## 2021-12-08 ENCOUNTER — Other Ambulatory Visit: Payer: 59

## 2021-12-08 DIAGNOSIS — Z130 Encounter for screening for diseases of the blood and blood-forming organs and certain disorders involving the immune mechanism: Secondary | ICD-10-CM

## 2021-12-08 DIAGNOSIS — E785 Hyperlipidemia, unspecified: Secondary | ICD-10-CM

## 2021-12-09 LAB — CBC WITH DIFFERENTIAL/PLATELET
Basophils Absolute: 0.1 10*3/uL (ref 0.0–0.2)
Basos: 1 %
EOS (ABSOLUTE): 0.3 10*3/uL (ref 0.0–0.4)
Eos: 3 %
Hematocrit: 36.9 % (ref 34.0–46.6)
Hemoglobin: 12.1 g/dL (ref 11.1–15.9)
Immature Grans (Abs): 0 10*3/uL (ref 0.0–0.1)
Immature Granulocytes: 0 %
Lymphocytes Absolute: 2.2 10*3/uL (ref 0.7–3.1)
Lymphs: 26 %
MCH: 26.4 pg — ABNORMAL LOW (ref 26.6–33.0)
MCHC: 32.8 g/dL (ref 31.5–35.7)
MCV: 81 fL (ref 79–97)
Monocytes Absolute: 0.7 10*3/uL (ref 0.1–0.9)
Monocytes: 9 %
Neutrophils Absolute: 5.1 10*3/uL (ref 1.4–7.0)
Neutrophils: 61 %
Platelets: 409 10*3/uL (ref 150–450)
RBC: 4.58 x10E6/uL (ref 3.77–5.28)
RDW: 14.9 % (ref 11.7–15.4)
WBC: 8.4 10*3/uL (ref 3.4–10.8)

## 2021-12-09 LAB — COMPREHENSIVE METABOLIC PANEL
ALT: 13 IU/L (ref 0–32)
AST: 14 IU/L (ref 0–40)
Albumin/Globulin Ratio: 1.3 (ref 1.2–2.2)
Albumin: 3.6 g/dL — ABNORMAL LOW (ref 3.8–4.8)
Alkaline Phosphatase: 84 IU/L (ref 44–121)
BUN/Creatinine Ratio: 15 (ref 9–23)
BUN: 11 mg/dL (ref 6–24)
Bilirubin Total: 0.3 mg/dL (ref 0.0–1.2)
CO2: 21 mmol/L (ref 20–29)
Calcium: 8.6 mg/dL — ABNORMAL LOW (ref 8.7–10.2)
Chloride: 103 mmol/L (ref 96–106)
Creatinine, Ser: 0.72 mg/dL (ref 0.57–1.00)
Globulin, Total: 2.7 g/dL (ref 1.5–4.5)
Glucose: 90 mg/dL (ref 70–99)
Potassium: 4.5 mmol/L (ref 3.5–5.2)
Sodium: 138 mmol/L (ref 134–144)
Total Protein: 6.3 g/dL (ref 6.0–8.5)
eGFR: 103 mL/min/{1.73_m2} (ref >59)

## 2021-12-09 LAB — HEMOGLOBIN A1C
Est. average glucose Bld gHb Est-mCnc: 111 mg/dL
Hgb A1c MFr Bld: 5.5 % (ref 4.8–5.6)

## 2021-12-09 LAB — PHOSPHORUS: Phosphorus: 3.2 mg/dL (ref 3.0–4.3)

## 2021-12-09 LAB — LIPID PANEL
Chol/HDL Ratio: 3.8 ratio (ref 0.0–4.4)
Cholesterol, Total: 167 mg/dL (ref 100–199)
HDL: 44 mg/dL (ref >39)
LDL Chol Calc (NIH): 105 mg/dL — ABNORMAL HIGH (ref 0–99)
Triglycerides: 100 mg/dL (ref 0–149)
VLDL Cholesterol Cal: 18 mg/dL (ref 5–40)

## 2021-12-09 LAB — PARATHYROID HORMONE, INTACT (NO CA): PTH: 43 pg/mL (ref 15–65)

## 2021-12-09 LAB — TSH: TSH: 1.37 u[IU]/mL (ref 0.450–4.500)

## 2021-12-10 ENCOUNTER — Other Ambulatory Visit: Payer: 59

## 2021-12-13 ENCOUNTER — Encounter: Payer: 59 | Admitting: Physician Assistant

## 2021-12-21 ENCOUNTER — Ambulatory Visit (INDEPENDENT_AMBULATORY_CARE_PROVIDER_SITE_OTHER): Payer: 59 | Admitting: Physician Assistant

## 2021-12-21 ENCOUNTER — Encounter: Payer: Self-pay | Admitting: Physician Assistant

## 2021-12-21 VITALS — BP 122/86 | HR 86 | Temp 98.3°F | Ht 64.0 in | Wt 284.0 lb

## 2021-12-21 DIAGNOSIS — Z0001 Encounter for general adult medical examination with abnormal findings: Secondary | ICD-10-CM | POA: Diagnosis not present

## 2021-12-21 DIAGNOSIS — E785 Hyperlipidemia, unspecified: Secondary | ICD-10-CM | POA: Diagnosis not present

## 2021-12-21 DIAGNOSIS — Z6841 Body Mass Index (BMI) 40.0 and over, adult: Secondary | ICD-10-CM

## 2021-12-21 DIAGNOSIS — N393 Stress incontinence (female) (male): Secondary | ICD-10-CM | POA: Diagnosis not present

## 2021-12-21 DIAGNOSIS — Z0000 Encounter for general adult medical examination without abnormal findings: Secondary | ICD-10-CM

## 2021-12-21 DIAGNOSIS — R600 Localized edema: Secondary | ICD-10-CM

## 2021-12-21 NOTE — Patient Instructions (Addendum)
?Preventive Care 49-49 Years Old, Female ?Preventive care refers to lifestyle choices and visits with your health care provider that can promote health and wellness. Preventive care visits are also called wellness exams. ?What can I expect for my preventive care visit? ?Counseling ?Your health care provider may ask you questions about your: ?Medical history, including: ?Past medical problems. ?Family medical history. ?Pregnancy history. ?Current health, including: ?Menstrual cycle. ?Method of birth control. ?Emotional well-being. ?Home life and relationship well-being. ?Sexual activity and sexual health. ?Lifestyle, including: ?Alcohol, nicotine or tobacco, and drug use. ?Access to firearms. ?Diet, exercise, and sleep habits. ?Work and work Statistician. ?Sunscreen use. ?Safety issues such as seatbelt and bike helmet use. ?Physical exam ?Your health care provider will check your: ?Height and weight. These may be used to calculate your BMI (body mass index). BMI is a measurement that tells if you are at a healthy weight. ?Waist circumference. This measures the distance around your waistline. This measurement also tells if you are at a healthy weight and may help predict your risk of certain diseases, such as type 2 diabetes and high blood pressure. ?Heart rate and blood pressure. ?Body temperature. ?Skin for abnormal spots. ?What immunizations do I need? ? ?Vaccines are usually given at various ages, according to a schedule. Your health care provider will recommend vaccines for you based on your age, medical history, and lifestyle or other factors, such as travel or where you work. ?What tests do I need? ?Screening ?Your health care provider may recommend screening tests for certain conditions. This may include: ?Lipid and cholesterol levels. ?Diabetes screening. This is done by checking your blood sugar (glucose) after you have not eaten for a while (fasting). ?Pelvic exam and Pap test. ?Hepatitis B test. ?Hepatitis  C test. ?HIV (human immunodeficiency virus) test. ?STI (sexually transmitted infection) testing, if you are at risk. ?Lung cancer screening. ?Colorectal cancer screening. ?Mammogram. Talk with your health care provider about when you should start having regular mammograms. This may depend on whether you have a family history of breast cancer. ?BRCA-related cancer screening. This may be done if you have a family history of breast, ovarian, tubal, or peritoneal cancers. ?Bone density scan. This is done to screen for osteoporosis. ?Talk with your health care provider about your test results, treatment options, and if necessary, the need for more tests. ?Follow these instructions at home: ?Eating and drinking ? ?Eat a diet that includes fresh fruits and vegetables, whole grains, lean protein, and low-fat dairy products. ?Take vitamin and mineral supplements as recommended by your health care provider. ?Do not drink alcohol if: ?Your health care provider tells you not to drink. ?You are pregnant, may be pregnant, or are planning to become pregnant. ?If you drink alcohol: ?Limit how much you have to 0-1 drink a day. ?Know how much alcohol is in your drink. In the [place] one drink equals one 12 oz bottle of beer (355 mL), one 5 oz glass of wine (148 mL), or one 1? oz glass of hard liquor (44 mL). ?Lifestyle ?Brush your teeth every morning and night with fluoride toothpaste. Floss one time each day. ?Exercise for at least 30 minutes 5 or more days each week. ?Do not use any products that contain nicotine or tobacco. These products include cigarettes, chewing tobacco, and vaping devices, such as e-cigarettes. If you need help quitting, ask your health care provider. ?Do not use drugs. ?If you are sexually active, practice safe sex. Use a condom or other form of protection to  prevent STIs. If you do not wish to become pregnant, use a form of birth control. If you plan to become pregnant, see your health care provider for a  prepregnancy visit. Take aspirin only as told by your health care provider. Make sure that you understand how much to take and what form to take. Work with your health care provider to find out whether it is safe and beneficial for you to take aspirin daily. Find healthy ways to manage stress, such as: Meditation, yoga, or listening to music. Journaling. Talking to a trusted person. Spending time with friends and family. Minimize exposure to UV radiation to reduce your risk of skin cancer. Safety Always wear your seat belt while driving or riding in a vehicle. Do not drive: If you have been drinking alcohol. Do not ride with someone who has been drinking. When you are tired or distracted. While texting. If you have been using any mind-altering substances or drugs. Wear a helmet and other protective equipment during sports activities. If you have firearms in your house, make sure you follow all gun safety procedures. Seek help if you have been physically or sexually abused. What's next? Visit your health care provider once a year for an annual wellness visit. Ask your health care provider how often you should have your eyes and teeth checked. Stay up to date on all vaccines. This information is not intended to replace advice given to you by your health care provider. Make sure you discuss any questions you have with your health care provider. Document Revised: 02/17/2021 Document Reviewed: 02/17/2021 Elsevier Patient Education  2023 Elsevier Inc.  

## 2021-12-21 NOTE — Progress Notes (Signed)
? ?Complete physical exam ? ? ?Patient: Carol Parks   DOB: 1971-08-28   51 y.o. Female  MRN: [id number] ?Visit Date: 12/21/2021 ? ? ?Chief Complaint  ?Patient presents with  ? Annual Exam  ? ?Subjective  ?  ?Carol Parks is a 51 y.o. female who presents today for a complete physical exam.  ?She reports consuming a low fat diet.  Walks or does high intensity videos online.  She generally feels fairly well. She does have additional problems to discuss today- swelling of lower extremity and urinary leakage. States unable to use pads due to irritation. No urinary frequency. ? ? ? ?Past Medical History:  ?Diagnosis Date  ? GERD (gastroesophageal reflux disease)   ? H/O gestational diabetes mellitus, not currently pregnant   ? H/O iron deficiency anemia   ? due to donating blood  ? Seasonal allergies   ? Sleep apnea   ? uses CPAP  ? ?Past Surgical History:  ?Procedure Laterality Date  ? REDUCTION MAMMAPLASTY Bilateral 1991  ? TUBAL LIGATION Bilateral 09/05/2013  ? Procedure: POST PARTUM TUBAL LIGATION;  Surgeon: Alwyn Pea, MD;  Location: [facility];  Service: Gynecology;  Laterality: Bilateral;  ? WISDOM TOOTH EXTRACTION    ? ?Social History  ? ?Socioeconomic History  ? Marital status: Married  ?  Spouse name: Not on file  ? Number of children: Not on file  ? Years of education: Not on file  ? Highest education level: Not on file  ?Occupational History  ? Occupation: Psychiatric nurse  ?Tobacco Use  ? Smoking status: Never  ? Smokeless tobacco: Never  ?Vaping Use  ? Vaping Use: Never used  ?Substance and Sexual Activity  ? Alcohol use: No  ?  Alcohol/week: 0.0 standard drinks  ? Drug use: No  ? Sexual activity: Yes  ?  Birth control/protection: None  ?Other Topics Concern  ? Not on file  ?Social History Narrative  ? Lives at home with husband and children.   ? ?Social Determinants of Health  ? ?Financial Resource Strain: Not on file  ?Food Insecurity: Not on file  ?Transportation Needs: Not on file  ?Physical Activity:  Not on file  ?Stress: Not on file  ?Social Connections: Not on file  ?Intimate Partner Violence: Not on file  ? ? ? ?Medications: ?Outpatient Medications Prior to Visit  ?Medication Sig  ? cetirizine (ZYRTEC) 10 MG chewable tablet Chew 10 mg by mouth daily as needed.  ? Cholecalciferol (VITAMIN D3 PO) Take 1 tablet by mouth daily at 6 (six) AM.  ? fluticasone (FLONASE) 50 MCG/ACT nasal spray Place 2 sprays into both nostrils daily.  ? Multiple Vitamin (MULTIVITAMIN PO) Take 1 tablet by mouth daily at 6 (six) AM.  ? Omega-3 Fatty Acids (FISH OIL PO) Take 1 capsule by mouth daily at 6 (six) AM.  ? Omeprazole 20 MG TBEC Take 1 tablet (20 mg total) by mouth daily before breakfast. For six weeks  ? Red Yeast Rice Extract (RED YEAST RICE PO) Take 1 capsule by mouth daily at 6 (six) AM.  ? ?No facility-administered medications prior to visit.  ? ? ?Review of Systems ?Review of Systems:  ?A fourteen system review of systems was performed and found to be positive as per HPI. ? ?Last CBC ?Lab Results  ?Component Value Date  ? WBC 8.4 12/08/2021  ? HGB 12.1 12/08/2021  ? HCT 36.9 12/08/2021  ? MCV 81 12/08/2021  ? MCH 26.4 (L) 12/08/2021  ? RDW 14.9 12/08/2021  ?  PLT 409 12/08/2021  ? ?Last metabolic panel ?Lab Results  ?Component Value Date  ? GLUCOSE 90 12/08/2021  ? NA 138 12/08/2021  ? K 4.5 12/08/2021  ? CL 103 12/08/2021  ? CO2 21 12/08/2021  ? BUN 11 12/08/2021  ? CREATININE 0.72 12/08/2021  ? EGFR 103 12/08/2021  ? CALCIUM 8.6 (L) 12/08/2021  ? PHOS 3.2 12/08/2021  ? PROT 6.3 12/08/2021  ? ALBUMIN 3.6 (L) 12/08/2021  ? LABGLOB 2.7 12/08/2021  ? AGRATIO 1.3 12/08/2021  ? BILITOT 0.3 12/08/2021  ? ALKPHOS 84 12/08/2021  ? AST 14 12/08/2021  ? ALT 13 12/08/2021  ? ?Last lipids ?Lab Results  ?Component Value Date  ? CHOL 167 12/08/2021  ? HDL 44 12/08/2021  ? LDLCALC 105 (H) 12/08/2021  ? TRIG 100 12/08/2021  ? CHOLHDL 3.8 12/08/2021  ? ?Last hemoglobin A1c ?Lab Results  ?Component Value Date  ? HGBA1C 5.5 12/08/2021   ? ?Last thyroid functions ?Lab Results  ?Component Value Date  ? TSH 1.370 12/08/2021  ? ?Last vitamin D ?Lab Results  ?Component Value Date  ? VD25OH 32.8 12/28/2020  ? ?  ? Objective  ? ?  ?BP 122/86   Pulse 86   Temp 98.3 ?F (36.8 ?C)   Ht 5' 4" (1.626 m)   Wt 284 lb (128.8 kg)   SpO2 98%   BMI 48.75 kg/m?  ?BP Readings from Last 3 Encounters:  ?12/21/21 122/86  ?04/22/21 (!) 148/79  ?12/10/20 136/82  ? ?Wt Readings from Last 3 Encounters:  ?12/21/21 284 lb (128.8 kg)  ?04/22/21 272 lb (123.4 kg)  ?04/07/21 272 lb (123.4 kg)  ? ? ?Physical Exam  ? ?General Appearance:     Alert, cooperative, in no acute distress, appears stated age   ?Head:    Normocephalic, without obvious abnormality, atraumatic  ?Eyes:    PERRL, conjunctiva/corneas clear, EOM's intact, fundi  ?  benign, both eyes  ?Ears:    Normal TM's and external ear canals, both ears  ?Nose:   Nares normal, septum midline, mucosa normal, no drainage  ?  or sinus tenderness  ?Throat:   Lips, mucosa, and tongue normal; teeth and gums normal  ?Neck:   Supple, symmetrical, trachea midline, no adenopathy;  ?  thyroid:  no enlargement/tenderness/nodules; no JVD  ?Back:     Symmetric, no curvature, ROM normal, no CVA tenderness  ?Lungs:     Clear to auscultation bilaterally, respirations unlabored  ?Chest Wall:    No tenderness or deformity  ? Heart:    Normal heart rate. Normal rhythm. No murmurs, rubs, or gallops.  ?  ?Breast Exam:    deferred  ?Abdomen:     Soft, non-tender, bowel sounds active all four quadrants,  ?  no masses, no organomegaly  ?Pelvic:    deferred  ?Extremities:   All extremities are intact. No cyanosis, trace of pitting edema b/l  ?Pulses:   2+ and symmetric all extremities  ?Skin:   Skin color, texture, turgor normal, no rashes or lesions  ?Lymph nodes:   Cervical and supraclavicular nodes normal  ?Neurologic:   CNII-XII grossly intact.  ? ? ? ?Last depression screening scores ? ?  12/21/2021  ?  2:46 PM 12/10/2020  ?  2:06 PM 11/26/2020   ?  3:32 PM  ?PHQ 2/9 Scores  ?PHQ - 2 Score 0 0 0  ?PHQ- 9 Score 0 0 0  ? ?Last fall risk screening ? ?  12/21/2021  ?  2:46 PM  ?  Fall Risk   ?Falls in the past year? 0  ?Number falls in past yr: 0  ?Injury with Fall? 0  ?Risk for fall due to : No Fall Risks  ?Follow up Falls evaluation completed  ? ? ? ?No results found for any visits on 12/21/21. ? Assessment & Plan  ?  ?Routine Health Maintenance and Physical Exam ? ?Exercise Activities and Dietary recommendations ?-Heart healthy diet low in fat and carbohydrates.  ? ?Immunization History  ?Administered Date(s) Administered  ? DTaP 06/05/2013  ? Influenza,inj,Quad PF,6+ Mos 06/28/2016, 07/24/2018, 06/14/2019  ? Influenza-Unspecified 06/03/2020, 07/07/2021  ? Moderna Sars-Covid-2 Vaccination 11/12/2019, 12/10/2019  ? Rho (D) Immune Globulin 09/05/2013  ? Tdap 07/24/2018  ? ? ?Health Maintenance  ?Topic Date Due  ? Hepatitis C Screening  Never done  ? COVID-19 Vaccine (3 - Booster for Moderna series) 02/04/2020  ? INFLUENZA VACCINE  04/05/2022  ? PAP SMEAR-Modifier  07/25/2022  ? COLONOSCOPY (Pts 45-33yr Insurance coverage will need to be confirmed)  04/22/2028  ? TETANUS/TDAP  07/24/2028  ? HIV Screening  Completed  ? HPV VACCINES  Aged Out  ? ? ?Discussed health benefits of physical activity, and encouraged her to engage in regular exercise appropriate for her age and condition. ? ?Problem List Items Addressed This Visit   ?None ?Visit Diagnoses   ? ? Healthcare maintenance    -  Primary  ? Morbid obesity ([place]      ? Hypocalcemia      ? Dyslipidemia      ? Stress incontinence, female      ? Lower extremity edema      ? ?  ? ? ?Discussed with patient most recent lab results which are essentially within normal limits or stable from prior. LDL mildly elevated, has improved from 111 to 105. ?The 10-year ASCVD risk score (Arnett DK, et al., 2019) is: 1% ?UTD colonoscopy, pap, mammogram. ?BP elevated on intake, BP repeated and improved. Will continue to  monitor. ?Discussed with patient to continue weight loss efforts with diet and exercise. Patient has cardiovascular risk factors and would benefit from wt loss to reduce risk. Reports has tried [place]

## 2022-01-28 ENCOUNTER — Ambulatory Visit (INDEPENDENT_AMBULATORY_CARE_PROVIDER_SITE_OTHER): Payer: 59 | Admitting: Nurse Practitioner

## 2022-01-28 ENCOUNTER — Encounter: Payer: Self-pay | Admitting: Nurse Practitioner

## 2022-01-28 VITALS — BP 134/89 | HR 100 | Temp 98.2°F | Ht 64.0 in | Wt 282.0 lb

## 2022-01-28 DIAGNOSIS — J0140 Acute pansinusitis, unspecified: Secondary | ICD-10-CM | POA: Diagnosis not present

## 2022-01-28 DIAGNOSIS — R0981 Nasal congestion: Secondary | ICD-10-CM | POA: Diagnosis not present

## 2022-01-28 LAB — POCT INFLUENZA A/B: Influenza A, POC: NEGATIVE

## 2022-01-28 MED ORDER — AZITHROMYCIN 250 MG PO TABS: ORAL_TABLET | ORAL | 0 refills | Status: DC

## 2022-01-28 NOTE — Progress Notes (Unsigned)
Established patient visit   Patient: Carol Parks   DOB: 26-May-1971   51 y.o. Female  MRN: [id number] Visit Date: 01/28/2022  No chief complaint on file.  Subjective    Sinusitis This is a new problem. The current episode started in the past 7 days. Progression since onset: children have been sick at home . There has been no fever. Associated symptoms include congestion, coughing, headaches, a hoarse voice and sinus pressure. Pertinent negatives include no chills, diaphoresis, shortness of breath, sneezing or sore throat. Treatments tried: ibuprofen. The treatment provided no relief.   ***  Medications: Outpatient Medications Prior to Visit  Medication Sig   cetirizine (ZYRTEC) 10 MG chewable tablet Chew 10 mg by mouth daily as needed.   Cholecalciferol (VITAMIN D3 PO) Take 1 tablet by mouth daily at 6 (six) AM.   fluticasone (FLONASE) 50 MCG/ACT nasal spray Place 2 sprays into both nostrils daily.   Multiple Vitamin (MULTIVITAMIN PO) Take 1 tablet by mouth daily at 6 (six) AM.   Omega-3 Fatty Acids (FISH OIL PO) Take 1 capsule by mouth daily at 6 (six) AM.   Omeprazole 20 MG TBEC Take 1 tablet (20 mg total) by mouth daily before breakfast. For six weeks   Red Yeast Rice Extract (RED YEAST RICE PO) Take 1 capsule by mouth daily at 6 (six) AM.   No facility-administered medications prior to visit.    Review of Systems  Constitutional:  Positive for fatigue. Negative for activity change, appetite change, chills, diaphoresis and fever.  HENT:  Positive for congestion, hoarse voice, postnasal drip, rhinorrhea, sinus pressure and sinus pain. Negative for sneezing and sore throat.        Cough is productive of green sputum.   Eyes: Negative.   Respiratory:  Positive for cough. Negative for chest tightness, shortness of breath and wheezing.   Cardiovascular:  Negative for chest pain and palpitations.  Gastrointestinal:  Negative for abdominal pain, constipation, diarrhea, nausea and  vomiting.  Endocrine: Negative for cold intolerance, heat intolerance, polydipsia and polyuria.  Genitourinary:  Negative for dyspareunia, dysuria, flank pain, frequency and urgency.  Musculoskeletal:  Negative for arthralgias, back pain and myalgias.  Skin:  Negative for rash.  Allergic/Immunologic: Positive for environmental allergies.  Neurological:  Positive for headaches. Negative for dizziness and weakness.  Hematological:  Negative for adenopathy.  Psychiatric/Behavioral:  The patient is not nervous/anxious.     Objective     Today's Vitals   01/28/22 0945 01/28/22 0949  BP: (!) 147/89 134/89  Pulse: 100   Temp: 98.2 F (36.8 C)   SpO2: 100%   Weight: 282 lb (127.9 kg)   Height:  '5\' 4"'$  (1.626 m)   Body mass index is 48.41 kg/m.   Physical Exam Vitals and nursing note reviewed.  Constitutional:      Appearance: Normal appearance. She is well-developed. She is obese. She is ill-appearing.  HENT:     Head: Normocephalic and atraumatic.  Eyes:     Pupils: Pupils are equal, round, and reactive to light.  Cardiovascular:     Rate and Rhythm: Normal rate and regular rhythm.     Pulses: Normal pulses.     Heart sounds: Normal heart sounds.  Pulmonary:     Effort: Pulmonary effort is normal.     Breath sounds: Normal breath sounds.  Abdominal:     Palpations: Abdomen is soft.  Musculoskeletal:        General: Normal range of motion.     Cervical  back: Normal range of motion and neck supple.  Lymphadenopathy:     Cervical: No cervical adenopathy.  Skin:    General: Skin is warm and dry.     Capillary Refill: Capillary refill takes less than 2 seconds.  Neurological:     General: No focal deficit present.     Mental Status: She is alert and oriented to person, place, and time.  Psychiatric:        Mood and Affect: Mood normal.        Behavior: Behavior normal.        Thought Content: Thought content normal.        Judgment: Judgment normal.    ***  No results  found for any visits on 01/28/22.  Assessment & Plan     ***  No follow-ups on file.        Ronnell Freshwater, NP  [facility] [phone] (phone) [phone] (fax)  [place]

## 2022-01-31 DIAGNOSIS — R0981 Nasal congestion: Secondary | ICD-10-CM

## 2022-01-31 DIAGNOSIS — J0140 Acute pansinusitis, unspecified: Secondary | ICD-10-CM | POA: Insufficient documentation

## 2022-01-31 HISTORY — DX: Acute pansinusitis, unspecified: J01.40

## 2022-01-31 HISTORY — DX: Nasal congestion: R09.81

## 2022-02-01 ENCOUNTER — Other Ambulatory Visit: Payer: Self-pay | Admitting: Nurse Practitioner

## 2022-02-01 ENCOUNTER — Telehealth: Payer: Self-pay | Admitting: Physician Assistant

## 2022-02-01 DIAGNOSIS — J0140 Acute pansinusitis, unspecified: Secondary | ICD-10-CM

## 2022-02-01 MED ORDER — METHYLPREDNISOLONE 4 MG PO TBPK: ORAL_TABLET | ORAL | 0 refills | Status: DC

## 2022-02-01 NOTE — Telephone Encounter (Signed)
Please let her know that is sent in medrol taper. This is steroid taper that she should take for 6 days. She should continue to rest and drink plenty of fluids. Take over the counter medication as needed. Thanks so much.   -HB

## 2022-02-01 NOTE — Telephone Encounter (Signed)
Patient has taken most of the zpak but is still coughing terribly and was told to call the office back if NI. Please advise.

## 2022-02-01 NOTE — Progress Notes (Signed)
Sent medrol taper to [facility] [address] for her. Continue over the counter meds as needed.

## 2022-02-02 NOTE — Telephone Encounter (Signed)
Patient is advised of her Rx that was sent

## 2022-05-23 ENCOUNTER — Encounter: Payer: Self-pay | Admitting: Family Medicine

## 2022-06-01 NOTE — Patient Instructions (Signed)

## 2022-06-01 NOTE — Progress Notes (Signed)
**Note Carol Parks-Identified via Obfuscation** PATIENT: Carol Parks DOB: 1971-07-07  REASON FOR VISIT: follow up HISTORY FROM: patient  Virtual Visit via Telephone Note  I connected with Carol Parks on 06/03/22 at  8:00 AM EDT by telephone and verified that I am speaking with the correct person using two identifiers.   I discussed the limitations, risks, security and privacy concerns of performing an evaluation and management service by telephone and the availability of in person appointments. I also discussed with the patient that there may be a patient responsible charge related to this service. The patient expressed understanding and agreed to proceed.   History of Present Illness:  06/03/22 ALL: Carol Parks returns for follow up for OSA on CPAP. She continues to do well on therapy. She does continue CPAP holiday on Fridays. She is using FFM and notes some leaking at times when on her side. She feels mask is comfortable. Apnea well managed.     06/01/2021 ALL: She returns for follow up for OSA on CPAP. She is doing fairly well with CPAP therapy. She had Covid mid September and was unable to use her machine for about a week. She continues to take a therapy holiday on Friday nights. She is using a FFM. She has some intermittent tenderness of the back of her head from the headgear. She has tried multiple masks and feels this is the best option. She has been disappointed about not being able to lose weight.     05/26/2020 ALL:  Carol Parks is a 51 y.o. female here today for follow up for OSA on CPAP.  She continues to do well with CPAP therapy.  She admits that she usually takes 1 day off during the week.  She does note significant improvement in daytime energy and improved sleep quality.  She denies any concerns today.  Compliance report dated 04/24/2020 through 05/23/2020 reveals that she used CPAP 24 of the past 30 days for compliance of 80%.  She used CPAP greater than 4 hours 23 of the past 30 days for compliance of 77%.  Average usage  was 6 hours and 9 minutes.  Residual AHI was 1.4 on 6 to 12 cm of water and an EPR of 3.  There was no significant leak noted.  History (copied from my note on 11/21/2019)  Carol Parks is a 51 y.o. female here today for follow up for OSA recently started on CPAP therapy. HST in 07/2019 revealed total AHI of 21.6/hr and O2nadir of 80%. She is doing fairly well with CPAP therapy. She has tried several masks and feels that the FFM is the best fit. She continues to have difficulty sleeping on her side but feels she is adjusting. She Does note that she feels better rested when she uses CPAP.    Compliance report dated 10/20/2019 through 11/18/2019 reveals that she used CPAP 24 of the last 30 days for compliance of 80%.  23 of the last 30 days she used CPAP greater than 4 hours for compliance of 77%.  Average usage on days used was 5 hours and 57 minutes.  Residual AHI was 2.0 on 6 to 12 cm of water and an EPR of 3.  There was no significant leak noted.     HISTORY: (copied from Dr Guadelupe Sabin note on 11/14/2018)   Dear Dr. Pamella Pert,   I saw your patient, Carol Parks, upon your kind request in my sleep clinic today for initial consultation of her sleep disorder, in particular, concern for underlying obstructive sleep apnea.  The patient is unaccompanied today. As you know, [name] is a 51 year old right-handed woman with an underlying medical history of allergies, reflux disease, nasal polyps, anemia and obesity, who reports snoring and excessive daytime somnolence. I reviewed your office note from 10/19/2018. She lives at home with her husband and 3 children, ages 62, 27 and 62. Time is generally late, she admits that she tends to stay up late. She is in bed between 11 and 11:30, rise time is 6:30. She has a history of allergies symptoms. She grinds her teeth and has been using a bite guard for years. Her weight has been fluctuating. At one point she was able to lose weight and she followed with weight management but  no longer sees a weight management doctor. Her snoring is reportedly loud and disturbing to her husband. She denies any telltale restless leg symptoms. She is a restless sleeper. She has had some morning headaches. She has migrainous headaches infrequently. No formal migraine diagnosis from before however. She denies nocturia. She is not aware of any family history of OSA. She has recently seen ENT for her nasal congestion and concern for nasal polyps. She has a sinus CT pending soon. She is a nonsmoker, does not utilize alcohol and drinks caffeine occasionally in the form of soda, not daily. She works for [name]   Observations/Objective:  Generalized: Well developed, in no acute distress  Mentation: Alert oriented to time, place, history taking. Follows all commands speech and language fluent   Assessment and Plan:  51 y.o. year old female  has a past medical history of GERD (gastroesophageal reflux disease), H/O gestational diabetes mellitus, not currently pregnant, H/O iron deficiency anemia, Seasonal allergies, and Sleep apnea. here with    ICD-10-CM   1. OSA on CPAP  G47.33 For home use only DME continuous positive airway pressure (CPAP)   Z99.89         Carol Parks is doing well with CPAP therapy.  Compliance report reveals acceptable compliance. I have encouraged her to use CPAP every night for greater than 4 hours every night. I will update supply orders. She may continue to work with DME if new mask option becomes available. Healthy lifestyle habits encouraged. Consider low carb diet. She will follow-up with [place] in 1 year, sooner if needed.  She verbalizes understanding and agreement with this plan.  Orders Placed This Encounter  Procedures   For home use only DME continuous positive airway pressure (CPAP)    Supplies    Order Specific Question:   Length of Need    Answer:   Lifetime    Order Specific Question:   Patient has OSA or probable OSA    Answer:   Yes    Order  Specific Question:   Is the patient currently using CPAP in the home    Answer:   Yes    Order Specific Question:   Settings    Answer:   Other see comments    Order Specific Question:   CPAP supplies needed    Answer:   Mask, headgear, cushions, filters, heated tubing and water chamber     No orders of the defined types were placed in this encounter.     Follow Up Instructions:  I discussed the assessment and treatment plan with the patient. The patient was provided an opportunity to ask questions and all were answered. The patient agreed with the plan and demonstrated an understanding of the instructions.   The patient  was advised to call back or seek an in-person evaluation if the symptoms worsen or if the condition fails to improve as anticipated.  I provided 15 minutes of non-face-to-face time during this encounter.  Patient is located at her place of residence during my chart visit.  Providers in the office.   Debbora Presto, NP

## 2022-06-03 ENCOUNTER — Encounter: Payer: Self-pay | Admitting: Family Medicine

## 2022-06-03 ENCOUNTER — Telehealth (INDEPENDENT_AMBULATORY_CARE_PROVIDER_SITE_OTHER): Payer: 59 | Admitting: Family Medicine

## 2022-06-03 DIAGNOSIS — Z9989 Dependence on other enabling machines and devices: Secondary | ICD-10-CM

## 2022-06-03 DIAGNOSIS — G4733 Obstructive sleep apnea (adult) (pediatric): Secondary | ICD-10-CM

## 2022-06-06 ENCOUNTER — Telehealth: Payer: Self-pay

## 2022-06-15 LAB — HM MAMMOGRAPHY: HM Mammogram: NORMAL (ref 0–4)

## 2022-07-06 ENCOUNTER — Encounter: Payer: Self-pay | Admitting: Physician Assistant

## 2022-12-26 ENCOUNTER — Encounter: Payer: Self-pay | Admitting: Nurse Practitioner

## 2023-01-17 ENCOUNTER — Encounter: Payer: Self-pay | Admitting: Nurse Practitioner

## 2023-01-18 ENCOUNTER — Ambulatory Visit: Payer: 59 | Admitting: Nurse Practitioner

## 2023-01-18 ENCOUNTER — Encounter: Payer: Self-pay | Admitting: Nurse Practitioner

## 2023-01-18 DIAGNOSIS — G8929 Other chronic pain: Secondary | ICD-10-CM

## 2023-01-18 DIAGNOSIS — K219 Gastro-esophageal reflux disease without esophagitis: Secondary | ICD-10-CM | POA: Diagnosis not present

## 2023-01-18 DIAGNOSIS — M545 Low back pain, unspecified: Secondary | ICD-10-CM

## 2023-01-18 DIAGNOSIS — Z1159 Encounter for screening for other viral diseases: Secondary | ICD-10-CM

## 2023-01-18 DIAGNOSIS — E782 Mixed hyperlipidemia: Secondary | ICD-10-CM

## 2023-01-18 NOTE — Progress Notes (Signed)
Careteam: Patient Care Team: Carol Seller, NP as PCP - General (Geriatric Medicine)  PLACE OF SERVICE:  [facility]  Advanced Directive information Does Patient Have a Medical Advance Directive?: Yes, Type of Advance Directive: Healthcare Power of [place];Living will, Does patient want to make changes to medical advance directive?: No - Patient declined  Allergies  Allergen Reactions   Penicillins Other (See Comments)    Get yeast infection   Latex Rash    Chief Complaint  Patient presents with   Establish Care    New patient to establish care. Patient c/o memory issues, weight concerns, and lower back pain x couple of weeks.      HPI: Patient is a 52 y.o. female to establish care.  Last physical was 12/21/2021 Had PAP in 2020- was told she was not due until 2025  Seasonal allergies- will use zyrtec 10 mg with flonase nasal spray at bedtime.   Takes a regular MVI and iron supplement - likes to give blood and takes iron to help   Uses fish oil and red yeast rice  to help cholesterol, years ago had elevated cholesterol but generally controlled.   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   She has always had a tender lower back- been to a chiropractor years ago which helped but expensive.  A few weeks ago had severe pain in lower back, uses heating pad and ibuprofen pain is better but hard to bend over.   Has done programs to lose weight and she will stick with it but then once it over loses interest.  She has a 50 year old, 53 and 50 year old.   Review of Systems:  Review of Systems  Constitutional:  Negative for chills, fever and weight loss.  HENT:  Negative for tinnitus.   Respiratory:  Negative for cough, sputum production and shortness of breath.   Cardiovascular:  Negative  for chest pain, palpitations and leg swelling.  Gastrointestinal:  Negative for abdominal pain, constipation, diarrhea and heartburn.  Genitourinary:  Negative for dysuria, frequency and urgency.  Musculoskeletal:  Positive for back pain. Negative for falls, joint pain and myalgias.  Skin: Negative.   Neurological:  Negative for dizziness and headaches.  Psychiatric/Behavioral:  Negative for depression and memory loss. The patient does not have insomnia.     Past Medical History:  Diagnosis Date   GERD (gastroesophageal reflux disease)    H/O gestational diabetes mellitus, not currently pregnant    H/O iron deficiency anemia    due to donating blood   Seasonal allergies    Sleep apnea    uses CPAP   Past Surgical History:  Procedure Laterality Date   REDUCTION MAMMAPLASTY Bilateral 1991   TUBAL LIGATION Bilateral 09/05/2013   Procedure: POST PARTUM TUBAL LIGATION;  Surgeon: Esmeralda Arthur, MD;  Location: [facility];  Service: Gynecology;  Laterality: Bilateral;   WISDOM TOOTH EXTRACTION     Social History:   reports that she has never smoked. She has never used smokeless tobacco. She reports that she does not drink alcohol and does not use drugs.  Family History  Problem Relation Age of Onset   Hypertension Mother    Cancer Mother    Heart attack Father    Aneurysm Father  Stroke Father    Mental retardation Paternal Aunt    Heart attack Maternal Grandmother    Heart attack Paternal Grandmother    Heart attack Paternal Grandfather    Asthma Daughter    Colon polyps Neg Hx    Colon cancer Neg Hx    Esophageal cancer Neg Hx    Rectal cancer Neg Hx    Stomach cancer Neg Hx     Medications: Patient's Medications  New Prescriptions   No medications on file  Previous Medications   CETIRIZINE (ZYRTEC) 10 MG CHEWABLE TABLET    Chew 10 mg by mouth daily as needed.   CHOLECALCIFEROL (VITAMIN D3 PO)    Take 1 tablet by mouth daily.   FLUTICASONE (FLONASE) 50 MCG/ACT NASAL  SPRAY    Place 2 sprays into both nostrils daily.   MULTIPLE VITAMIN (MULTIVITAMIN PO)    Take 1 tablet by mouth daily.   MULTIPLE VITAMINS-IRON (MULTIVITAMIN PLUS IRON ADULT PO)    Take 1 tablet by mouth 2 (two) times a week.   OMEGA-3 FATTY ACIDS (FISH OIL PO)    Take 1 capsule by mouth 2 (two) times a week.   OMEPRAZOLE 20 MG TBEC    Take 1 tablet (20 mg total) by mouth daily before breakfast. For six weeks   RED YEAST RICE EXTRACT (RED YEAST RICE PO)    Take 1 capsule by mouth 2 (two) times a week.  Modified Medications   No medications on file  Discontinued Medications   AZITHROMYCIN (ZITHROMAX) 250 MG TABLET    z-pack - take as directed for 5 days   METHYLPREDNISOLONE (MEDROL) 4 MG TBPK TABLET    Take by mouth as directed for 6 days    Physical Exam:  Vitals:   01/18/23 0816  BP: 134/80  Pulse: 76  Temp: (!) 97.5 F (36.4 C)  TempSrc: Temporal  SpO2: 97%  Weight: 278 lb (126.1 kg)  Height: 5\' 4"  (1.626 m)   Body mass index is 47.72 kg/m. Wt Readings from Last 3 Encounters:  01/18/23 278 lb (126.1 kg)  01/28/22 282 lb (127.9 kg)  12/21/21 284 lb (128.8 kg)    Physical Exam Constitutional:      General: She is not in acute distress.    Appearance: She is well-developed. She is not diaphoretic.  HENT:     Head: Normocephalic and atraumatic.     Mouth/Throat:     Pharynx: No oropharyngeal exudate.  Eyes:     Conjunctiva/sclera: Conjunctivae normal.     Pupils: Pupils are equal, round, and reactive to light.  Cardiovascular:     Rate and Rhythm: Normal rate and regular rhythm.     Heart sounds: Normal heart sounds.  Pulmonary:     Effort: Pulmonary effort is normal.     Breath sounds: Normal breath sounds.  Abdominal:     General: Bowel sounds are normal.     Palpations: Abdomen is soft.  Musculoskeletal:        General: Tenderness (to lumber paraspinal muscles) present.     Cervical back: Normal range of motion and neck supple.     Right lower leg: No edema.      Left lower leg: No edema.  Skin:    General: Skin is warm and dry.  Neurological:     Mental Status: She is alert.  Psychiatric:        Mood and Affect: Mood normal.     Labs reviewed: Basic Metabolic Panel: No results  for input(s): "NA", "K", "CL", "CO2", "GLUCOSE", "BUN", "CREATININE", "CALCIUM", "MG", "PHOS", "TSH" in the last 8760 hours. Liver Function Tests: No results for input(s): "AST", "ALT", "ALKPHOS", "BILITOT", "PROT", "ALBUMIN" in the last 8760 hours. No results for input(s): "LIPASE", "AMYLASE" in the last 8760 hours. No results for input(s): "AMMONIA" in the last 8760 hours. CBC: No results for input(s): "WBC", "NEUTROABS", "HGB", "HCT", "MCV", "PLT" in the last 8760 hours. Lipid Panel: No results for input(s): "CHOL", "HDL", "LDLCALC", "TRIG", "CHOLHDL", "LDLDIRECT" in the last 8760 hours. TSH: No results for input(s): "TSH" in the last 8760 hours. A1C: Lab Results  Component Value Date   HGBA1C 5.5 12/08/2021     Assessment/Plan 1. Morbid obesity (HCC) -BMI 47, education provided on healthy weight loss through increase in physical activity and proper nutrition.  -plan to look into nutrition plan that is sustainable.  - Lipid panel - COMPLETE METABOLIC PANEL WITH GFR - CBC with Differential/Platelet - Hemoglobin A1c - TSH  2. Chronic bilateral low back pain without sciatica Improved at this time but ongoing. Heating pad three times daily ~30 mins Aleve 1 tablet by mouth twice daily for 1 week Physical therapy referral.  - Ambulatory referral to Physical Therapy  3. Gastroesophageal reflux disease without esophagitis Stable on omeprazole  4. Need for hepatitis C screening test - Hepatitis C antibody  5. Mixed hyperlipidemia -improved on omega 3 and red yeast rice. Will follow up lipids  Return in about 6 weeks (around 03/01/2023) for CPE with PAP .  Janene Harvey. Biagio Borg [facility] [phone]

## 2023-01-18 NOTE — Patient Instructions (Addendum)
Heating pad three times daily ~30 mins Aleve 1 tablet by mouth twice daily for 1 week Physical therapy referral.

## 2023-01-19 LAB — HEMOGLOBIN A1C
Hgb A1c MFr Bld: 5.5 % of total Hgb (ref <5.7)
Mean Plasma Glucose: 111 mg/dL
eAG (mmol/L): 6.2 mmol/L

## 2023-01-19 LAB — COMPLETE METABOLIC PANEL WITH GFR
AG Ratio: 1.3 (calc) (ref 1.0–2.5)
ALT: 11 U/L (ref 6–29)
AST: 10 U/L (ref 10–35)
Albumin: 3.6 g/dL (ref 3.6–5.1)
Alkaline phosphatase (APISO): 78 U/L (ref 31–125)
BUN: 13 mg/dL (ref 7–25)
CO2: 26 mmol/L (ref 20–32)
Calcium: 8.9 mg/dL (ref 8.6–10.2)
Chloride: 106 mmol/L (ref 98–110)
Creat: 0.72 mg/dL (ref 0.50–0.99)
Globulin: 2.8 g/dL (calc) (ref 1.9–3.7)
Glucose, Bld: 83 mg/dL (ref 65–99)
Potassium: 4.3 mmol/L (ref 3.5–5.3)
Sodium: 139 mmol/L (ref 135–146)
Total Bilirubin: 0.4 mg/dL (ref 0.2–1.2)
Total Protein: 6.4 g/dL (ref 6.1–8.1)
eGFR: 102 mL/min/{1.73_m2} (ref >=60)

## 2023-01-19 LAB — CBC WITH DIFFERENTIAL/PLATELET
Absolute Monocytes: 608 cells/uL (ref 200–950)
Basophils Absolute: 63 cells/uL (ref 0–200)
Basophils Relative: 0.8 %
Eosinophils Absolute: 221 cells/uL (ref 15–500)
Eosinophils Relative: 2.8 %
HCT: 38.2 % (ref 35.0–45.0)
Hemoglobin: 12.3 g/dL (ref 11.7–15.5)
Lymphs Abs: 1943 cells/uL (ref 850–3900)
MCH: 27.2 pg (ref 27.0–33.0)
MCHC: 32.2 g/dL (ref 32.0–36.0)
MCV: 84.5 fL (ref 80.0–100.0)
MPV: 9.9 fL (ref 7.5–12.5)
Monocytes Relative: 7.7 %
Neutro Abs: 5064 cells/uL (ref 1500–7800)
Neutrophils Relative %: 64.1 %
Platelets: 360 10*3/uL (ref 140–400)
RBC: 4.52 10*6/uL (ref 3.80–5.10)
RDW: 13.9 % (ref 11.0–15.0)
Total Lymphocyte: 24.6 %
WBC: 7.9 10*3/uL (ref 3.8–10.8)

## 2023-01-19 LAB — HEPATITIS C ANTIBODY: Hepatitis C Ab: NONREACTIVE

## 2023-01-19 LAB — LIPID PANEL
Cholesterol: 181 mg/dL (ref <200)
HDL: 42 mg/dL — ABNORMAL LOW (ref >=50)
LDL Cholesterol (Calc): 116 mg/dL (calc) — ABNORMAL HIGH
Non-HDL Cholesterol (Calc): 139 mg/dL (calc) — ABNORMAL HIGH (ref <130)
Total CHOL/HDL Ratio: 4.3 (calc) (ref <5.0)
Triglycerides: 121 mg/dL (ref <150)

## 2023-01-19 LAB — TSH: TSH: 1.25 mIU/L

## 2023-01-23 ENCOUNTER — Other Ambulatory Visit: Payer: Self-pay

## 2023-01-23 DIAGNOSIS — E7800 Pure hypercholesterolemia, unspecified: Secondary | ICD-10-CM

## 2023-01-25 ENCOUNTER — Encounter: Payer: Self-pay | Admitting: Physical Therapy

## 2023-01-25 ENCOUNTER — Other Ambulatory Visit: Payer: Self-pay

## 2023-01-25 ENCOUNTER — Ambulatory Visit: Payer: 59 | Admitting: Physical Therapy

## 2023-01-25 DIAGNOSIS — R293 Abnormal posture: Secondary | ICD-10-CM | POA: Diagnosis not present

## 2023-01-25 DIAGNOSIS — M5459 Other low back pain: Secondary | ICD-10-CM | POA: Diagnosis not present

## 2023-01-25 DIAGNOSIS — M6281 Muscle weakness (generalized): Secondary | ICD-10-CM | POA: Diagnosis not present

## 2023-01-25 DIAGNOSIS — R262 Difficulty in walking, not elsewhere classified: Secondary | ICD-10-CM

## 2023-01-25 NOTE — Therapy (Signed)
OUTPATIENT PHYSICAL THERAPY THORACOLUMBAR EVALUATION   Patient Name: Carol Parks MRN: [id number] DOB:1971-08-26, 52 y.o., female Today's Date: 01/25/2023  END OF SESSION:  PT End of Session - 01/25/23 1158     Visit Number 1    Number of Visits 20    Date for PT Re-Evaluation 04/07/23    PT Start Time 0801    PT Stop Time 0842    PT Time Calculation (min) 41 min    Activity Tolerance Patient tolerated treatment well    Behavior During Therapy WFL for tasks assessed/performed             Past Medical History:  Diagnosis Date   GERD (gastroesophageal reflux disease)    H/O gestational diabetes mellitus, not currently pregnant    H/O iron deficiency anemia    due to donating blood   Seasonal allergies    Sleep apnea    uses CPAP   Past Surgical History:  Procedure Laterality Date   REDUCTION MAMMAPLASTY Bilateral 1991   TUBAL LIGATION Bilateral 09/05/2013   Procedure: POST PARTUM TUBAL LIGATION;  Surgeon: Esmeralda Arthur, MD;  Location: [facility];  Service: Gynecology;  Laterality: Bilateral;   WISDOM TOOTH EXTRACTION     Patient Active Problem List   Diagnosis Date Noted   Acute non-recurrent pansinusitis 01/31/2022   Nasal congestion 01/31/2022   OSA on CPAP 05/31/2021   Hiatal hernia with GERD 05/13/2017   Latex allergy 09/03/2013   Hx of reduction mammoplasty 09/03/2013    PCP: Sharon Seller, NP )  REFERRING PROVIDER: Sharon Seller, NP   REFERRING DIAG: M54.50,G89.29 (ICD-10-CM) - Chronic bilateral low back pain without sciatica  Rationale for Evaluation and Treatment: Rehabilitation  THERAPY DIAG:  Other low back pain  Difficulty in walking, not elsewhere classified  Abnormal posture  Muscle weakness (generalized)  ONSET DATE: about a month ago                                                                                                                                                                                           SUBJECTIVE:  SUBJECTIVE STATEMENT:  She has always had a tender lower back- been to a chiropractor years ago which helped but expensive.  A few weeks ago had severe pain in lower back, uses heating pad and ibuprofen pain is better but hard to bend over.  PERTINENT HISTORY:  GERD, iron deficiency anemia, sleep apnea  PAIN:  NPRS scale: 4/10 Pain location: low back Pain description: achy Aggravating factors: bending, prolonged sitting Relieving factors: heating pad, aleve  PRECAUTIONS: None  WEIGHT BEARING RESTRICTIONS: No  FALLS:  Has patient fallen in last 6 months? 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  PLOF: Independent  PATIENT GOALS: Lose Weight, work without pain  Next MD Visit:    OBJECTIVE:   DIAGNOSTIC FINDINGS:  01/25/23: no images found  PATIENT SURVEYS:  01/25/23: FOTO eval:  58  predicted:  76  SCREENING FOR RED FLAGS: Bowel or bladder incontinence: No Cauda equina syndrome: No  COGNITION: Overall cognitive status: WFL normal      SENSATION: 01/25/23: WFL  MUSCLE LENGTH: Hamstrings: Right 55 deg; Left 60 deg   POSTURE: rounded shoulders, forward head, and increased lumbar lordosis  PALPATION: 01/25/23: TTP, bilateral lumbar paraspinals, bilateral QL, bilateral piriformis  LUMBAR ROM:   AROM 01/25/23  Flexion 65  Extension 22  Right lateral flexion 32  Left lateral flexion 25  Right rotation Limited 50%  Left rotation Limited 50%   (Blank rows = not tested)  LOWER EXTREMITY ROM:       Right 01/25/23 Left 01/25/23  Hip flexion A: 102 A: 100  Hip  extension    Hip abduction    Hip adduction    Hip internal rotation    Hip external rotation    Knee flexion    Knee extension    Ankle dorsiflexion    Ankle plantarflexion    Ankle inversion    Ankle eversion     (Blank rows = not tested)  LOWER EXTREMITY MMT:    MMT Right 01/25/23 Left 01/25/23  Hip flexion 5 4  Hip extension    Hip abduction 5 5  Hip adduction 5 5  Hip internal rotation    Hip external rotation    Knee flexion 5 5  Knee extension 5 5  Ankle dorsiflexion    Ankle plantarflexion    Ankle inversion    Ankle eversion     (Blank rows = not tested)  LUMBAR SPECIAL TESTS:  01/25/23: Slump test: Negative  FUNCTIONAL TESTS:   01/25/23: 5 times sit to stand: 13 seconds no UE support  GAIT: Distance walked: 30 feet  Assistive device utilized: None Level of assistance: Complete Independence Comments: anterior pelvic tilt, wide BOS  TODAY'S TREATMENT:  DATE:  01/25/23:   Therex:    HEP instruction/performance c cues for techniques, handout provided.  Trial set performed of each for comprehension and symptom assessment.  See below for exercise list  PATIENT EDUCATION:  Education details: HEP, POC Person educated: Patient Education method: Explanation, Demonstration, Verbal cues, and Handouts Education comprehension: verbalized understanding, returned demonstration, and verbal cues required  HOME EXERCISE PROGRAM: Access Code: [email or web address] URL: [email or web address] Date: 01/25/2023 Prepared by: Narda Amber  Exercises - Supine Posterior Pelvic Tilt  - 2 x daily - 7 x weekly - 2 sets - 10 reps - 5 seconds hold - Bridge  - 2 x daily - 7 x weekly - 2 sets - 10 reps - 5 seconds hold - Supine Figure 4 Piriformis Stretch  - 2 x daily - 7 x weekly - 3 reps - 30 seconds hold - Supine Piriformis Stretch with Foot on Ground   - 2 x daily - 7 x weekly - 3 reps - 30 seconds hold - Seated Hamstring Stretch  - 2 x daily - 7 x weekly - 3 reps - 30 seconds hold  ASSESSMENT:  CLINICAL IMPRESSION: Patient is a 52 y.o. who comes to clinic with complaints of low back pain which began about 1 month ago. Pt reporting prolonged sitting makes her pain worse. Pt also reporting after walking prolonged distances her pain worsens. Pt presenting with mobility, strength and movement coordination deficits that impair their ability to perform usual daily and recreational functional activities without increase difficulty/symptoms at this time. We discussed possibility of purchasing a physio-ball for pt to sit on while sitting at her desk at work.  Patient to benefit from skilled PT services to address impairments and limitations to improve to previous level of function without restriction secondary to condition.   OBJECTIVE IMPAIRMENTS: decreased activity tolerance, decreased balance, difficulty walking, decreased ROM, decreased strength, obesity, and pain.   ACTIVITY LIMITATIONS: lifting, bending, sitting, standing, squatting, and transfers  PARTICIPATION LIMITATIONS: cleaning, driving, community activity, and occupation  PERSONAL FACTORS: 3+ comorbidities: see pertinent history above  are also affecting patient's functional outcome.   REHAB POTENTIAL: Good  CLINICAL DECISION MAKING: Stable/uncomplicated  EVALUATION COMPLEXITY: Low   GOALS: Goals reviewed with patient? Yes  SHORT TERM GOALS: (target date for Short term goals are 3 weeks 02/17/23)  1. Patient will demonstrate independent use of home exercise program to maintain progress from in clinic treatments.  Goal status: New  LONG TERM GOALS: (target dates for all long term goals are 10 weeks  04/07/23 )   1. Patient will demonstrate/report pain at worst less than or equal to 2/10 to facilitate minimal limitation in daily activity secondary to pain symptoms.  Goal status:  New   2. Patient will demonstrate independent use of home exercise program to facilitate ability to maintain/progress functional gains from skilled physical therapy services.  Goal status: New   3. Patient will demonstrate FOTO outcome > or = 76 % to indicate reduced disability due to condition.  Goal status: New   4. Pt will improve bilateral trunk rotation to WNL for improved functional mobility.   Goal status: New   5.  Pt will be able to report sitting at her desk for 1 hour with pain </= 2/10 in her low back.    Goal status: New   6.  Pt will be able to walk for >/= 20 minutes with no pain noted in her low back.   Goal status: New  PLAN:  PT FREQUENCY: 1-2x/week  PT DURATION: 10 weeks  PLANNED INTERVENTIONS: Therapeutic exercises, Therapeutic activity, Neuro Muscular re-education, Balance training, Gait training, Patient/Family education, Joint mobilization, Stair training, DME instructions, Dry Needling, Electrical stimulation, Cryotherapy, vasopneumatic device, Moist heat, Taping, Traction Ultrasound, Ionotophoresis 4mg /ml Dexamethasone, and aquatic therapy, Manual therapy.  All included unless contraindicated  PLAN FOR NEXT SESSION: Review HEP knowledge/results, manual as needed, core strengthening          Sharmon Leyden, PT, MPT 01/25/2023, 12:00 PM

## 2023-02-06 ENCOUNTER — Encounter: Payer: Self-pay | Admitting: Physical Therapy

## 2023-02-06 ENCOUNTER — Ambulatory Visit: Payer: 59 | Admitting: Physical Therapy

## 2023-02-06 DIAGNOSIS — R293 Abnormal posture: Secondary | ICD-10-CM | POA: Diagnosis not present

## 2023-02-06 DIAGNOSIS — R262 Difficulty in walking, not elsewhere classified: Secondary | ICD-10-CM | POA: Diagnosis not present

## 2023-02-06 DIAGNOSIS — M5459 Other low back pain: Secondary | ICD-10-CM | POA: Diagnosis not present

## 2023-02-06 DIAGNOSIS — M6281 Muscle weakness (generalized): Secondary | ICD-10-CM | POA: Diagnosis not present

## 2023-02-06 NOTE — Therapy (Signed)
OUTPATIENT PHYSICAL THERAPY THORACOLUMBAR  TREATMENT NOTE   Patient Name: Carol Parks MRN: [id number] DOB:22-Jan-1971, 52 y.o., female Today's Date: 02/06/2023  END OF SESSION:  PT End of Session - 02/06/23 0826     Visit Number 2    Number of Visits 20    Date for PT Re-Evaluation 04/07/23    PT Start Time 0804    PT Stop Time 0844    PT Time Calculation (min) 40 min    Activity Tolerance Patient tolerated treatment well    Behavior During Therapy WFL for tasks assessed/performed              Past Medical History:  Diagnosis Date   GERD (gastroesophageal reflux disease)    H/O gestational diabetes mellitus, not currently pregnant    H/O iron deficiency anemia    due to donating blood   Seasonal allergies    Sleep apnea    uses CPAP   Past Surgical History:  Procedure Laterality Date   REDUCTION MAMMAPLASTY Bilateral 1991   TUBAL LIGATION Bilateral 09/05/2013   Procedure: POST PARTUM TUBAL LIGATION;  Surgeon: Esmeralda Arthur, MD;  Location: [facility];  Service: Gynecology;  Laterality: Bilateral;   WISDOM TOOTH EXTRACTION     Patient Active Problem List   Diagnosis Date Noted   Acute non-recurrent pansinusitis 01/31/2022   Nasal congestion 01/31/2022   OSA on CPAP 05/31/2021   Hiatal hernia with GERD 05/13/2017   Latex allergy 09/03/2013   Hx of reduction mammoplasty 09/03/2013    PCP: Sharon Seller, NP )  REFERRING PROVIDER: Sharon Seller, NP   REFERRING DIAG: M54.50,G89.29 (ICD-10-CM) - Chronic bilateral low back pain without sciatica  Rationale for Evaluation and Treatment: Rehabilitation  THERAPY DIAG:  Other low back pain  Difficulty in walking, not elsewhere classified  Abnormal posture  Muscle weakness (generalized)  ONSET DATE: about a month ago                                                                                                                                                                                           SUBJECTIVE:  SUBJECTIVE STATEMENT: Pt arriving today reporting 3/10 pain in her low back. Pt stating she was fairly consistent with her HEP performing at least once a day.   PERTINENT HISTORY:  GERD, iron deficiency anemia, sleep apnea  PAIN:  NPRS scale: 3/10 Pain location: low back Pain description: achy Aggravating factors: bending, prolonged sitting Relieving factors: heating pad, aleve  PRECAUTIONS: None  WEIGHT BEARING RESTRICTIONS: No  FALLS:  Has patient fallen in last 6 months? 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  PLOF: Independent  PATIENT GOALS: Lose Weight, work without pain  Next MD Visit:    OBJECTIVE:   DIAGNOSTIC FINDINGS:  01/25/23: no images found  PATIENT SURVEYS:  01/25/23: FOTO eval:  58  predicted:  76  SCREENING FOR RED FLAGS: Bowel or bladder incontinence: No Cauda equina syndrome: No  COGNITION: Overall cognitive status: WFL normal      SENSATION: 01/25/23: WFL  MUSCLE LENGTH: Hamstrings: Right 55 deg; Left 60 deg   POSTURE: rounded shoulders, forward head, and increased lumbar lordosis  PALPATION: 01/25/23: TTP, bilateral lumbar paraspinals, bilateral QL, bilateral piriformis  LUMBAR ROM:   AROM 01/25/23  Flexion 65  Extension 22  Right lateral flexion 32  Left lateral flexion 25  Right rotation Limited 50%  Left rotation Limited 50%   (Blank rows = not tested)  LOWER EXTREMITY ROM:       Right 01/25/23 Left 01/25/23  Hip flexion A: 102 A: 100  Hip extension    Hip abduction    Hip adduction    Hip internal rotation    Hip  external rotation    Knee flexion    Knee extension    Ankle dorsiflexion    Ankle plantarflexion    Ankle inversion    Ankle eversion     (Blank rows = not tested)  LOWER EXTREMITY MMT:    MMT Right 01/25/23 Left 01/25/23  Hip flexion 5 4  Hip extension    Hip abduction 5 5  Hip adduction 5 5  Hip internal rotation    Hip external rotation    Knee flexion 5 5  Knee extension 5 5  Ankle dorsiflexion    Ankle plantarflexion    Ankle inversion    Ankle eversion     (Blank rows = not tested)  LUMBAR SPECIAL TESTS:  01/25/23: Slump test: Negative  FUNCTIONAL TESTS:   01/25/23: 5 times sit to stand: 13 seconds no UE support  GAIT: Distance walked: 30 feet  Assistive device utilized: None Level of assistance: Complete Independence Comments: anterior pelvic tilt, wide BOS  TODAY'S TREATMENT:  DATE:  02/06/23:  TherEx:  Nustep: Level 3 x 6 minutes UE/LE Standing lumbar extension: x 10 holding 10 seconds Seated hamstring stretch: x 2 each LE holding 20 sec Standing hip flexor stretch in lunge position c UE support x 2 each LE holding 20 sec Supine PPT: x 10 holding 10 sec Piriformis stretch: pushing knee away (figure 4) x 3 on each LE Piriformis stretch: pulling knee to opposite shoulder x 3 on each LE Manual:  Skilled palpation during TPDN of active trigger points Trigger Point Dry-Needling  Treatment instructions: Expect mild to moderate muscle soreness. S/S of pneumothorax if dry needled over a lung field, and to seek immediate medical attention should they occur. Patient verbalized understanding of these instructions and education. Patient Consent Given: Yes Education handout provided: Previously provided Muscles treated: lumbar multifidi Treatment response/outcome: multiple twitch responses noted    01/25/23:   Therex:    HEP  instruction/performance c cues for techniques, handout provided.  Trial set performed of each for comprehension and symptom assessment.  See below for exercise list  PATIENT EDUCATION:  Education details: HEP, POC Person educated: Patient Education method: Explanation, Demonstration, Verbal cues, and Handouts Education comprehension: verbalized understanding, returned demonstration, and verbal cues required  HOME EXERCISE PROGRAM: Access Code: [email or web address] URL: [email or web address] Date: 01/25/2023 Prepared by: Narda Amber  Exercises - Supine Posterior Pelvic Tilt  - 2 x daily - 7 x weekly - 2 sets - 10 reps - 5 seconds hold - Bridge  - 2 x daily - 7 x weekly - 2 sets - 10 reps - 5 seconds hold - Supine Figure 4 Piriformis Stretch  - 2 x daily - 7 x weekly - 3 reps - 30 seconds hold - Supine Piriformis Stretch with Foot on Ground  - 2 x daily - 7 x weekly - 3 reps - 30 seconds hold - Seated Hamstring Stretch  - 2 x daily - 7 x weekly - 3 reps - 30 seconds hold  ASSESSMENT:  CLINICAL IMPRESSION: Pt arriving to therapy reporting 3/10 pain in her low back. Pt stating she has felt less pain since starting her stretching. PT"s HEP was reviewed with compliance noted and good techniques. Pt with good response to TPDN this visit. Recommending continued skilled PT interventions to maximize pt's function.     OBJECTIVE IMPAIRMENTS: decreased activity tolerance, decreased balance, difficulty walking, decreased ROM, decreased strength, obesity, and pain.   ACTIVITY LIMITATIONS: lifting, bending, sitting, standing, squatting, and transfers  PARTICIPATION LIMITATIONS: cleaning, driving, community activity, and occupation  PERSONAL FACTORS: 3+ comorbidities: see pertinent history above  are also affecting patient's functional outcome.   REHAB POTENTIAL: Good  CLINICAL DECISION MAKING: Stable/uncomplicated  EVALUATION COMPLEXITY: Low   GOALS: Goals reviewed with patient?  Yes  SHORT TERM GOALS: (target date for Short term goals are 3 weeks 02/17/23)  1. Patient will demonstrate independent use of home exercise program to maintain progress from in clinic treatments.  Goal status: New  LONG TERM GOALS: (target dates for all long term goals are 10 weeks  04/07/23 )   1. Patient will demonstrate/report pain at worst less than or equal to 2/10 to facilitate minimal limitation in daily activity secondary to pain symptoms.  Goal status: New   2. Patient will demonstrate independent use of home exercise program to facilitate ability to maintain/progress functional gains from skilled physical therapy services.  Goal status: New   3. Patient will demonstrate FOTO outcome > or = 76 % to indicate reduced  disability due to condition.  Goal status: New   4. Pt will improve bilateral trunk rotation to WNL for improved functional mobility.   Goal status: New   5.  Pt will be able to report sitting at her desk for 1 hour with pain </= 2/10 in her low back.    Goal status: New   6.  Pt will be able to walk for >/= 20 minutes with no pain noted in her low back.   Goal status: New     PLAN:  PT FREQUENCY: 1-2x/week  PT DURATION: 10 weeks  PLANNED INTERVENTIONS: Therapeutic exercises, Therapeutic activity, Neuro Muscular re-education, Balance training, Gait training, Patient/Family education, Joint mobilization, Stair training, DME instructions, Dry Needling, Electrical stimulation, Cryotherapy, vasopneumatic device, Moist heat, Taping, Traction Ultrasound, Ionotophoresis 4mg /ml Dexamethasone, and aquatic therapy, Manual therapy.  All included unless contraindicated  PLAN FOR NEXT SESSION: assess response to DN, manual as needed, core strengthening          Sharmon Leyden, PT, MPT 02/06/2023, 8:30 AM

## 2023-02-13 ENCOUNTER — Encounter: Payer: Self-pay | Admitting: Physical Therapy

## 2023-02-13 ENCOUNTER — Ambulatory Visit: Payer: 59 | Admitting: Physical Therapy

## 2023-02-13 DIAGNOSIS — M6281 Muscle weakness (generalized): Secondary | ICD-10-CM | POA: Diagnosis not present

## 2023-02-13 DIAGNOSIS — R293 Abnormal posture: Secondary | ICD-10-CM | POA: Diagnosis not present

## 2023-02-13 DIAGNOSIS — R262 Difficulty in walking, not elsewhere classified: Secondary | ICD-10-CM | POA: Diagnosis not present

## 2023-02-13 DIAGNOSIS — M5459 Other low back pain: Secondary | ICD-10-CM | POA: Diagnosis not present

## 2023-02-13 NOTE — Therapy (Signed)
OUTPATIENT PHYSICAL THERAPY THORACOLUMBAR  TREATMENT NOTE   Patient Name: Carol Parks MRN: [id number] DOB:1970/09/22, 52 y.o., female Today's Date: 02/13/2023  END OF SESSION:  PT End of Session - 02/13/23 0854     Visit Number 3    Number of Visits 20    Date for PT Re-Evaluation 04/07/23    PT Start Time 0805    PT Stop Time 0845    PT Time Calculation (min) 40 min    Activity Tolerance Patient tolerated treatment well    Behavior During Therapy WFL for tasks assessed/performed               Past Medical History:  Diagnosis Date   GERD (gastroesophageal reflux disease)    H/O gestational diabetes mellitus, not currently pregnant    H/O iron deficiency anemia    due to donating blood   Seasonal allergies    Sleep apnea    uses CPAP   Past Surgical History:  Procedure Laterality Date   REDUCTION MAMMAPLASTY Bilateral 1991   TUBAL LIGATION Bilateral 09/05/2013   Procedure: POST PARTUM TUBAL LIGATION;  Surgeon: Esmeralda Arthur, MD;  Location: [facility];  Service: Gynecology;  Laterality: Bilateral;   WISDOM TOOTH EXTRACTION     Patient Active Problem List   Diagnosis Date Noted   Acute non-recurrent pansinusitis 01/31/2022   Nasal congestion 01/31/2022   OSA on CPAP 05/31/2021   Hiatal hernia with GERD 05/13/2017   Latex allergy 09/03/2013   Hx of reduction mammoplasty 09/03/2013    PCP: Sharon Seller, NP )  REFERRING PROVIDER: Sharon Seller, NP   REFERRING DIAG: M54.50,G89.29 (ICD-10-CM) - Chronic bilateral low back pain without sciatica  Rationale for Evaluation and Treatment: Rehabilitation  THERAPY DIAG:  Other low back pain  Difficulty in walking, not elsewhere classified  Abnormal posture  Muscle weakness (generalized)  ONSET DATE: about a month ago                                                                                                                                                                                           SUBJECTIVE:  SUBJECTIVE STATEMENT: Pt reporting good response to DN at her last visit. Pt feels that the stretching has helped.   PERTINENT HISTORY:  GERD, iron deficiency anemia, sleep apnea  PAIN:  NPRS scale: 2/10 today Pain location: low back Pain description: achy Aggravating factors: bending, prolonged sitting Relieving factors: heating pad, aleve  PRECAUTIONS: None  WEIGHT BEARING RESTRICTIONS: No  FALLS:  Has patient fallen in last 6 months? 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  PLOF: Independent  PATIENT GOALS: Lose Weight, work without pain  Next MD Visit:    OBJECTIVE:   DIAGNOSTIC FINDINGS:  01/25/23: no images found  PATIENT SURVEYS:  01/25/23: FOTO eval:  58  predicted:  76  SCREENING FOR RED FLAGS: Bowel or bladder incontinence: No Cauda equina syndrome: No  COGNITION: Overall cognitive status: WFL normal      SENSATION: 01/25/23: WFL  MUSCLE LENGTH: Hamstrings: Right 55 deg; Left 60 deg   POSTURE: rounded shoulders, forward head, and increased lumbar lordosis  PALPATION: 01/25/23: TTP, bilateral lumbar paraspinals, bilateral QL, bilateral piriformis  LUMBAR ROM:   AROM 01/25/23 02/13/23  Flexion 65 76  Extension 22 32  Right lateral flexion 32 36  Left lateral flexion 25 34  Right rotation Limited 50% Limited 25%  Left rotation Limited 50% WFL   (Blank rows = not tested)  LOWER EXTREMITY ROM:       Right 01/25/23 Left 01/25/23  Hip flexion A: 102 A: 100  Hip extension    Hip abduction    Hip adduction    Hip internal rotation    Hip external  rotation    Knee flexion    Knee extension    Ankle dorsiflexion    Ankle plantarflexion    Ankle inversion    Ankle eversion     (Blank rows = not tested)  LOWER EXTREMITY MMT:    MMT Right 01/25/23 Left 01/25/23  Hip flexion 5 4  Hip extension    Hip abduction 5 5  Hip adduction 5 5  Hip internal rotation    Hip external rotation    Knee flexion 5 5  Knee extension 5 5  Ankle dorsiflexion    Ankle plantarflexion    Ankle inversion    Ankle eversion     (Blank rows = not tested)  LUMBAR SPECIAL TESTS:  01/25/23: Slump test: Negative  FUNCTIONAL TESTS:   01/25/23: 5 times sit to stand: 13 seconds no UE support  GAIT: Distance walked: 30 feet  Assistive device utilized: None Level of assistance: Complete Independence Comments: anterior pelvic tilt, wide BOS  TODAY'S TREATMENT:  DATE:  02/13/23:  TherEx:  Nustep: Level 3 x 6 minutes UE/LE Supine hamstring stretch: x 2 each LE holding 30 sec Supine trunk rotation: x 3 to each side x 20 sec Supine PPT c bridge x 10 holding 5 sec Supine SLR: x 10 each LE Manual:  Skilled palpation during TPDN of active trigger points Trigger Point Dry-Needling  Treatment instructions: Expect mild to moderate muscle soreness. S/S of pneumothorax if dry needled over a lung field, and to seek immediate medical attention should they occur. Patient verbalized understanding of these instructions and education. Patient Consent Given: Yes Education handout provided: Previously provided Muscles treated: bil glute medius, and bil piriformis Treatment response/outcome: multiple twitch responses noted     02/06/23:  TherEx:  Nustep: Level 3 x 6 minutes UE/LE Standing lumbar extension: x 10 holding 10 seconds Seated hamstring stretch: x 2 each LE holding 20 sec Standing hip flexor stretch in lunge position c UE support x  2 each LE holding 20 sec Supine PPT: x 10 holding 10 sec Piriformis stretch: pushing knee away (figure 4) x 3 on each LE Piriformis stretch: pulling knee to opposite shoulder x 3 on each LE Manual:  Skilled palpation during TPDN of active trigger points Trigger Point Dry-Needling  Treatment instructions: Expect mild to moderate muscle soreness. S/S of pneumothorax if dry needled over a lung field, and to seek immediate medical attention should they occur. Patient verbalized understanding of these instructions and education. Patient Consent Given: Yes Education handout provided: Previously provided Muscles treated: lumbar multifidi Treatment response/outcome: multiple twitch responses noted    01/25/23:   Therex:    HEP instruction/performance c cues for techniques, handout provided.  Trial set performed of each for comprehension and symptom assessment.  See below for exercise list  PATIENT EDUCATION:  Education details: HEP, POC Person educated: Patient Education method: Explanation, Demonstration, Verbal cues, and Handouts Education comprehension: verbalized understanding, returned demonstration, and verbal cues required  HOME EXERCISE PROGRAM: Access Code: [email or web address] URL: [email or web address] Date: 01/25/2023 Prepared by: Narda Amber  Exercises - Supine Posterior Pelvic Tilt  - 2 x daily - 7 x weekly - 2 sets - 10 reps - 5 seconds hold - Bridge  - 2 x daily - 7 x weekly - 2 sets - 10 reps - 5 seconds hold - Supine Figure 4 Piriformis Stretch  - 2 x daily - 7 x weekly - 3 reps - 30 seconds hold - Supine Piriformis Stretch with Foot on Ground  - 2 x daily - 7 x weekly - 3 reps - 30 seconds hold - Seated Hamstring Stretch  - 2 x daily - 7 x weekly - 3 reps - 30 seconds hold  ASSESSMENT:  CLINICAL IMPRESSION: Pt stating she had a good response to DN at her last visit. Pt tolerating all exercises well.  Pt wishing to try DN again this visit with good response  noted. Pt's lumbar ROM has improved since her initial evaluation.  Recommending continued skilled PT interventions to maximize pt's function.     OBJECTIVE IMPAIRMENTS: decreased activity tolerance, decreased balance, difficulty walking, decreased ROM, decreased strength, obesity, and pain.   ACTIVITY LIMITATIONS: lifting, bending, sitting, standing, squatting, and transfers  PARTICIPATION LIMITATIONS: cleaning, driving, community activity, and occupation  PERSONAL FACTORS: 3+ comorbidities: see pertinent history above  are also affecting patient's functional outcome.   REHAB POTENTIAL: Good  CLINICAL DECISION MAKING: Stable/uncomplicated  EVALUATION COMPLEXITY: Low   GOALS: Goals reviewed with patient? Yes  SHORT TERM  GOALS: (target date for Short term goals are 3 weeks 02/17/23)  1. Patient will demonstrate independent use of home exercise program to maintain progress from in clinic treatments.  Goal status: MET 02/13/23  LONG TERM GOALS: (target dates for all long term goals are 10 weeks  04/07/23 )   1. Patient will demonstrate/report pain at worst less than or equal to 2/10 to facilitate minimal limitation in daily activity secondary to pain symptoms.  Goal status: New   2. Patient will demonstrate independent use of home exercise program to facilitate ability to maintain/progress functional gains from skilled physical therapy services.  Goal status: New   3. Patient will demonstrate FOTO outcome > or = 76 % to indicate reduced disability due to condition.  Goal status: New   4. Pt will improve bilateral trunk rotation to WNL for improved functional mobility.   Goal status: New   5.  Pt will be able to report sitting at her desk for 1 hour with pain </= 2/10 in her low back.    Goal status: New   6.  Pt will be able to walk for >/= 20 minutes with no pain noted in her low back.   Goal status: New     PLAN:  PT FREQUENCY: 1-2x/week  PT DURATION: 10  weeks  PLANNED INTERVENTIONS: Therapeutic exercises, Therapeutic activity, Neuro Muscular re-education, Balance training, Gait training, Patient/Family education, Joint mobilization, Stair training, DME instructions, Dry Needling, Electrical stimulation, Cryotherapy, vasopneumatic device, Moist heat, Taping, Traction Ultrasound, Ionotophoresis 4mg /ml Dexamethasone, and aquatic therapy, Manual therapy.  All included unless contraindicated  PLAN FOR NEXT SESSION: assess response to DN, manual as needed, core strengthening Begin Quadraped, Leg Press          Sharmon Leyden, [place], MPT 02/13/2023, 8:55 AM

## 2023-02-21 ENCOUNTER — Ambulatory Visit: Payer: 59 | Admitting: Physical Therapy

## 2023-02-21 ENCOUNTER — Encounter: Payer: Self-pay | Admitting: Physical Therapy

## 2023-02-21 DIAGNOSIS — M6281 Muscle weakness (generalized): Secondary | ICD-10-CM | POA: Diagnosis not present

## 2023-02-21 DIAGNOSIS — M5459 Other low back pain: Secondary | ICD-10-CM | POA: Diagnosis not present

## 2023-02-21 DIAGNOSIS — R293 Abnormal posture: Secondary | ICD-10-CM | POA: Diagnosis not present

## 2023-02-21 DIAGNOSIS — R262 Difficulty in walking, not elsewhere classified: Secondary | ICD-10-CM | POA: Diagnosis not present

## 2023-02-21 NOTE — Therapy (Signed)
OUTPATIENT PHYSICAL THERAPY THORACOLUMBAR  TREATMENT NOTE   Patient Name: Carol Parks MRN: [id number] DOB:07-Jan-1971, 52 y.o., female Today's Date: 02/21/2023  END OF SESSION:  PT End of Session - 02/21/23 0807     Visit Number 4    Number of Visits 20    Date for PT Re-Evaluation 04/07/23    PT Start Time 0800    PT Stop Time 0845    PT Time Calculation (min) 45 min    Activity Tolerance Patient tolerated treatment well    Behavior During Therapy WFL for tasks assessed/performed               Past Medical History:  Diagnosis Date   GERD (gastroesophageal reflux disease)    H/O gestational diabetes mellitus, not currently pregnant    H/O iron deficiency anemia    due to donating blood   Seasonal allergies    Sleep apnea    uses CPAP   Past Surgical History:  Procedure Laterality Date   REDUCTION MAMMAPLASTY Bilateral 1991   TUBAL LIGATION Bilateral 09/05/2013   Procedure: POST PARTUM TUBAL LIGATION;  Surgeon: Esmeralda Arthur, MD;  Location: [facility];  Service: Gynecology;  Laterality: Bilateral;   WISDOM TOOTH EXTRACTION     Patient Active Problem List   Diagnosis Date Noted   Acute non-recurrent pansinusitis 01/31/2022   Nasal congestion 01/31/2022   OSA on CPAP 05/31/2021   Hiatal hernia with GERD 05/13/2017   Latex allergy 09/03/2013   Hx of reduction mammoplasty 09/03/2013    PCP: Sharon Seller, NP )  REFERRING PROVIDER: Sharon Seller, NP   REFERRING DIAG: M54.50,G89.29 (ICD-10-CM) - Chronic bilateral low back pain without sciatica  Rationale for Evaluation and Treatment: Rehabilitation  THERAPY DIAG:  Other low back pain  Difficulty in walking, not elsewhere classified  Abnormal posture  Muscle weakness (generalized)  ONSET DATE: about a month ago                                                                                                                                                                                           SUBJECTIVE:  SUBJECTIVE STATEMENT: Pt again reporting good response to DN last visit. Pt stating she is now able to lye on the ground without pain. Pt stating overall improvements. Pt still reporting 2/10 in her low back.   PERTINENT HISTORY:  GERD, iron deficiency anemia, sleep apnea  PAIN:  NPRS scale: 2/10  Pain location: low back Pain description: achy Aggravating factors: bending, prolonged sitting Relieving factors: heating pad, aleve  PRECAUTIONS: None  WEIGHT BEARING RESTRICTIONS: No  FALLS:  Has patient fallen in last 6 months? 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  PLOF: Independent  PATIENT GOALS: Lose Weight, work without pain  Next MD Visit:    OBJECTIVE:   DIAGNOSTIC FINDINGS:  01/25/23: no images found  PATIENT SURVEYS:  01/25/23: FOTO eval:  58  predicted:  76  SCREENING FOR RED FLAGS: Bowel or bladder incontinence: No Cauda equina syndrome: No  COGNITION: Overall cognitive status: WFL normal      SENSATION: 01/25/23: WFL  MUSCLE LENGTH: Hamstrings: Right 55 deg; Left 60 deg   POSTURE: rounded shoulders, forward head, and increased lumbar lordosis  PALPATION: 01/25/23: TTP, bilateral lumbar paraspinals, bilateral QL, bilateral piriformis  LUMBAR ROM:   AROM 01/25/23 02/13/23  Flexion 65 76  Extension 22 32  Right lateral flexion 32 36  Left lateral flexion 25 34  Right rotation Limited 50% Limited 25%  Left rotation Limited 50% WFL   (Blank rows = not tested)  LOWER EXTREMITY ROM:       Right 01/25/23 Left 01/25/23  Hip flexion A: 102 A: 100  Hip  extension    Hip abduction    Hip adduction    Hip internal rotation    Hip external rotation    Knee flexion    Knee extension    Ankle dorsiflexion    Ankle plantarflexion    Ankle inversion    Ankle eversion     (Blank rows = not tested)  LOWER EXTREMITY MMT:    MMT Right 01/25/23 Left 01/25/23  Hip flexion 5 4  Hip extension    Hip abduction 5 5  Hip adduction 5 5  Hip internal rotation    Hip external rotation    Knee flexion 5 5  Knee extension 5 5  Ankle dorsiflexion    Ankle plantarflexion    Ankle inversion    Ankle eversion     (Blank rows = not tested)  LUMBAR SPECIAL TESTS:  01/25/23: Slump test: Negative  FUNCTIONAL TESTS:   01/25/23: 5 times sit to stand: 13 seconds no UE support  GAIT: Distance walked: 30 feet  Assistive device utilized: None Level of assistance: Complete Independence Comments: anterior pelvic tilt, wide BOS  TODAY'S TREATMENT:  DATE:  02/21/23:  TherEx:  Nustep: Level 3 x 6 minutes UE/LE Prone: hip extension Prone: knees flexed 90 degs, trunk rotation Quadraped: cat/cow x 10 holding 10 sec Supine bridges: x 10 holding 10 sec Piriformis stretch: x 2 each LE Leg Press: 50# bil LE's x 15  Manual:  Skilled palpation during TPDN of active trigger points Trigger Point Dry-Needling  Treatment instructions: Expect mild to moderate muscle soreness. S/S of pneumothorax if dry needled over a lung field, and to seek immediate medical attention should they occur. Patient verbalized understanding of these instructions and education. Patient Consent Given: Yes Education handout provided: Previously provided Muscles treated: bil lumbar multifidi, bilateral piriformis Treatment response/outcome: multiple twitch responses noted      02/13/23:  TherEx:  Nustep: Level 3 x 6 minutes UE/LE Supine hamstring stretch: x 2  each LE holding 30 sec Supine trunk rotation: x 3 to each side x 20 sec Supine PPT c bridge x 10 holding 5 sec Supine SLR: x 10 each LE Manual:  Skilled palpation during TPDN of active trigger points Trigger Point Dry-Needling  Treatment instructions: Expect mild to moderate muscle soreness. S/S of pneumothorax if dry needled over a lung field, and to seek immediate medical attention should they occur. Patient verbalized understanding of these instructions and education. Patient Consent Given: Yes Education handout provided: Previously provided Muscles treated: bil glute medius, and bil piriformis Treatment response/outcome: multiple twitch responses noted     02/06/23:  TherEx:  Nustep: Level 3 x 6 minutes UE/LE Standing lumbar extension: x 10 holding 10 seconds Seated hamstring stretch: x 2 each LE holding 20 sec Standing hip flexor stretch in lunge position c UE support x 2 each LE holding 20 sec Supine PPT: x 10 holding 10 sec Piriformis stretch: pushing knee away (figure 4) x 3 on each LE Piriformis stretch: pulling knee to opposite shoulder x 3 on each LE Manual:  Skilled palpation during TPDN of active trigger points Trigger Point Dry-Needling  Treatment instructions: Expect mild to moderate muscle soreness. S/S of pneumothorax if dry needled over a lung field, and to seek immediate medical attention should they occur. Patient verbalized understanding of these instructions and education. Patient Consent Given: Yes Education handout provided: Previously provided Muscles treated: lumbar multifidi Treatment response/outcome: multiple twitch responses noted    01/25/23:   Therex:    HEP instruction/performance c cues for techniques, handout provided.  Trial set performed of each for comprehension and symptom assessment.  See below for exercise list  PATIENT EDUCATION:  Education details: HEP, POC Person educated: Patient Education method: Explanation, Demonstration, Verbal  cues, and Handouts Education comprehension: verbalized understanding, returned demonstration, and verbal cues required  HOME EXERCISE PROGRAM: Access Code: [email or web address] URL: [email or web address] Date: 01/25/2023 Prepared by: Narda Amber  Exercises - Supine Posterior Pelvic Tilt  - 2 x daily - 7 x weekly - 2 sets - 10 reps - 5 seconds hold - Bridge  - 2 x daily - 7 x weekly - 2 sets - 10 reps - 5 seconds hold - Supine Figure 4 Piriformis Stretch  - 2 x daily - 7 x weekly - 3 reps - 30 seconds hold - Supine Piriformis Stretch with Foot on Ground  - 2 x daily - 7 x weekly - 3 reps - 30 seconds hold - Seated Hamstring Stretch  - 2 x daily - 7 x weekly - 3 reps - 30 seconds hold  ASSESSMENT:  CLINICAL IMPRESSION: Pt arriving reporting 2/10 pain in  her low back and glutes. Pt reporting overall improvements since beginning therapy. Pt focusing more on flexion based activities as well as postural correction. Recommending continued skilled PT interventions to maximize pt's function.     OBJECTIVE IMPAIRMENTS: decreased activity tolerance, decreased balance, difficulty walking, decreased ROM, decreased strength, obesity, and pain.   ACTIVITY LIMITATIONS: lifting, bending, sitting, standing, squatting, and transfers  PARTICIPATION LIMITATIONS: cleaning, driving, community activity, and occupation  PERSONAL FACTORS: 3+ comorbidities: see pertinent history above  are also affecting patient's functional outcome.   REHAB POTENTIAL: Good  CLINICAL DECISION MAKING: Stable/uncomplicated  EVALUATION COMPLEXITY: Low   GOALS: Goals reviewed with patient? Yes  SHORT TERM GOALS: (target date for Short term goals are 3 weeks 02/17/23)  1. Patient will demonstrate independent use of home exercise program to maintain progress from in clinic treatments.  Goal status: MET 02/13/23  LONG TERM GOALS: (target dates for all long term goals are 10 weeks  04/07/23 )   1. Patient will  demonstrate/report pain at worst less than or equal to 2/10 to facilitate minimal limitation in daily activity secondary to pain symptoms.  Goal status: New   2. Patient will demonstrate independent use of home exercise program to facilitate ability to maintain/progress functional gains from skilled physical therapy services.  Goal status: New   3. Patient will demonstrate FOTO outcome > or = 76 % to indicate reduced disability due to condition.  Goal status: New   4. Pt will improve bilateral trunk rotation to WNL for improved functional mobility.   Goal status: New   5.  Pt will be able to report sitting at her desk for 1 hour with pain </= 2/10 in her low back.    Goal status: New   6.  Pt will be able to walk for >/= 20 minutes with no pain noted in her low back.   Goal status: New     PLAN:  PT FREQUENCY: 1-2x/week  PT DURATION: 10 weeks  PLANNED INTERVENTIONS: Therapeutic exercises, Therapeutic activity, Neuro Muscular re-education, Balance training, Gait training, Patient/Family education, Joint mobilization, Stair training, DME instructions, Dry Needling, Electrical stimulation, Cryotherapy, vasopneumatic device, Moist heat, Taping, Traction Ultrasound, Ionotophoresis 4mg /ml Dexamethasone, and aquatic therapy, Manual therapy.  All included unless contraindicated  PLAN FOR NEXT SESSION: assess response to DN, manual as needed, core strengthening           Sharmon Leyden, PT, MPT 02/21/2023, 8:34 AM

## 2023-02-27 ENCOUNTER — Encounter: Payer: Self-pay | Admitting: Physical Therapy

## 2023-02-27 ENCOUNTER — Ambulatory Visit: Payer: 59 | Admitting: Physical Therapy

## 2023-02-27 DIAGNOSIS — M6281 Muscle weakness (generalized): Secondary | ICD-10-CM

## 2023-02-27 DIAGNOSIS — R293 Abnormal posture: Secondary | ICD-10-CM

## 2023-02-27 DIAGNOSIS — R262 Difficulty in walking, not elsewhere classified: Secondary | ICD-10-CM | POA: Diagnosis not present

## 2023-02-27 DIAGNOSIS — M5459 Other low back pain: Secondary | ICD-10-CM

## 2023-02-27 NOTE — Therapy (Signed)
OUTPATIENT PHYSICAL THERAPY THORACOLUMBAR  TREATMENT NOTE   Patient Name: Carol Parks MRN: [id number] DOB:October 26, 1970, 52 y.o., female Today's Date: 02/27/2023  END OF SESSION:  PT End of Session - 02/27/23 0820     Visit Number 5    Number of Visits 20    Date for PT Re-Evaluation 04/07/23    PT Start Time 0800    PT Stop Time 0840    PT Time Calculation (min) 40 min    Activity Tolerance Patient tolerated treatment well    Behavior During Therapy WFL for tasks assessed/performed               Past Medical History:  Diagnosis Date   GERD (gastroesophageal reflux disease)    H/O gestational diabetes mellitus, not currently pregnant    H/O iron deficiency anemia    due to donating blood   Seasonal allergies    Sleep apnea    uses CPAP   Past Surgical History:  Procedure Laterality Date   REDUCTION MAMMAPLASTY Bilateral 1991   TUBAL LIGATION Bilateral 09/05/2013   Procedure: POST PARTUM TUBAL LIGATION;  Surgeon: Esmeralda Arthur, MD;  Location: [facility];  Service: Gynecology;  Laterality: Bilateral;   WISDOM TOOTH EXTRACTION     Patient Active Problem List   Diagnosis Date Noted   Acute non-recurrent pansinusitis 01/31/2022   Nasal congestion 01/31/2022   OSA on CPAP 05/31/2021   Hiatal hernia with GERD 05/13/2017   Latex allergy 09/03/2013   Hx of reduction mammoplasty 09/03/2013    PCP: Sharon Seller, NP )  REFERRING PROVIDER: Sharon Seller, NP   REFERRING DIAG: M54.50,G89.29 (ICD-10-CM) - Chronic bilateral low back pain without sciatica  Rationale for Evaluation and Treatment: Rehabilitation  THERAPY DIAG:  Other low back pain  Difficulty in walking, not elsewhere classified  Abnormal posture  Muscle weakness (generalized)  ONSET DATE: about a month ago                                                                                                                                                                                           SUBJECTIVE:  SUBJECTIVE STATEMENT: Pt stating pain today is 2-3/10 in her low back. Pt reporting good response to DN at her last visit.   PERTINENT HISTORY:  GERD, iron deficiency anemia, sleep apnea  PAIN:  NPRS scale: 2-3/10  Pain location: low back Pain description: achy Aggravating factors: bending, prolonged sitting Relieving factors: heating pad, aleve  PRECAUTIONS: None  WEIGHT BEARING RESTRICTIONS: No  FALLS:  Has patient fallen in last 6 months? 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  PLOF: Independent  PATIENT GOALS: Lose Weight, work without pain  Next MD Visit:    OBJECTIVE:   DIAGNOSTIC FINDINGS:  01/25/23: no images found  PATIENT SURVEYS:  01/25/23: FOTO eval:  58  predicted:  76  SCREENING FOR RED FLAGS: Bowel or bladder incontinence: No Cauda equina syndrome: No  COGNITION: Overall cognitive status: WFL normal      SENSATION: 01/25/23: WFL  MUSCLE LENGTH: Hamstrings: Right 55 deg; Left 60 deg   POSTURE: rounded shoulders, forward head, and increased lumbar lordosis  PALPATION: 01/25/23: TTP, bilateral lumbar paraspinals, bilateral QL, bilateral piriformis  LUMBAR ROM:   AROM 01/25/23 02/13/23  Flexion 65 76  Extension 22 32  Right lateral flexion 32 36  Left lateral flexion 25 34  Right rotation Limited 50% Limited 25%  Left rotation Limited 50% WFL   (Blank rows = not tested)  LOWER EXTREMITY ROM:       Right 01/25/23 Left 01/25/23  Hip flexion A: 102 A: 100  Hip extension    Hip abduction    Hip adduction    Hip internal rotation    Hip  external rotation    Knee flexion    Knee extension    Ankle dorsiflexion    Ankle plantarflexion    Ankle inversion    Ankle eversion     (Blank rows = not tested)  LOWER EXTREMITY MMT:    MMT Right 01/25/23 Left 01/25/23  Hip flexion 5 4  Hip extension    Hip abduction 5 5  Hip adduction 5 5  Hip internal rotation    Hip external rotation    Knee flexion 5 5  Knee extension 5 5  Ankle dorsiflexion    Ankle plantarflexion    Ankle inversion    Ankle eversion     (Blank rows = not tested)  LUMBAR SPECIAL TESTS:  01/25/23: Slump test: Negative  FUNCTIONAL TESTS:   01/25/23: 5 times sit to stand: 13 seconds no UE support  GAIT: Distance walked: 30 feet  Assistive device utilized: None Level of assistance: Complete Independence Comments: anterior pelvic tilt, wide BOS ____________________________________________________________  TODAY'S TREATMENT:  DATE:  02/27/23:  TherEx:  Nustep: Level 3 x 6 minutes UE/LE Supine: trunk rotation: x 3 holding 20 seconds bil Supine thoracic rotation x 3 holding 30 sec Supine bridges: x 10 holding 10 sec Supine bridges c clam shells  2 x 10  BATCA: 20# 2 x 10 holding 3 sec Leg Press: 75# bil LE's x 15, single LE: 37#  x 10     02/21/23:  TherEx:  Nustep: Level 3 x 6 minutes UE/LE Prone: hip extension Prone: knees flexed 90 degs, trunk rotation Quadraped: cat/cow x 10 holding 10 sec Supine bridges: x 10 holding 10 sec Piriformis stretch: x 2 each LE Leg Press: 50# bil LE's x 15  Manual:  Skilled palpation during TPDN of active trigger points Trigger Point Dry-Needling  Treatment instructions: Expect mild to moderate muscle soreness. S/S of pneumothorax if dry needled over a lung field, and to seek immediate medical attention should they occur. Patient verbalized understanding of these instructions and  education. Patient Consent Given: Yes Education handout provided: Previously provided Muscles treated: bil lumbar multifidi, bilateral piriformis Treatment response/outcome: multiple twitch responses noted      02/13/23:  TherEx:  Nustep: Level 3 x 6 minutes UE/LE Supine hamstring stretch: x 2 each LE holding 30 sec Supine trunk rotation: x 3 to each side x 20 sec Supine PPT c bridge x 10 holding 5 sec Supine SLR: x 10 each LE Manual:  Skilled palpation during TPDN of active trigger points Trigger Point Dry-Needling  Treatment instructions: Expect mild to moderate muscle soreness. S/S of pneumothorax if dry needled over a lung field, and to seek immediate medical attention should they occur. Patient verbalized understanding of these instructions and education. Patient Consent Given: Yes Education handout provided: Previously provided Muscles treated: bil glute medius, and bil piriformis Treatment response/outcome: multiple twitch responses noted      PATIENT EDUCATION:  Education details: HEP, POC Person educated: Patient Education method: Programmer, multimedia, Demonstration, Verbal cues, and Handouts Education comprehension: verbalized understanding, returned demonstration, and verbal cues required  HOME EXERCISE PROGRAM: Access Code: [email or web address] URL: [email or web address] Date: 01/25/2023 Prepared by: Narda Amber  Exercises - Supine Posterior Pelvic Tilt  - 2 x daily - 7 x weekly - 2 sets - 10 reps - 5 seconds hold - Bridge  - 2 x daily - 7 x weekly - 2 sets - 10 reps - 5 seconds hold - Supine Figure 4 Piriformis Stretch  - 2 x daily - 7 x weekly - 3 reps - 30 seconds hold - Supine Piriformis Stretch with Foot on Ground  - 2 x daily - 7 x weekly - 3 reps - 30 seconds hold - Seated Hamstring Stretch  - 2 x daily - 7 x weekly - 3 reps - 30 seconds hold  ASSESSMENT:  CLINICAL IMPRESSION: Pt arriving today reporting 2-3/10 in her low back. Pt tolerating all  exercises well focusing on core strengthening. Recommend continue skilled PT interventions to maximize pt's function.     OBJECTIVE IMPAIRMENTS: decreased activity tolerance, decreased balance, difficulty walking, decreased ROM, decreased strength, obesity, and pain.   ACTIVITY LIMITATIONS: lifting, bending, sitting, standing, squatting, and transfers  PARTICIPATION LIMITATIONS: cleaning, driving, community activity, and occupation  PERSONAL FACTORS: 3+ comorbidities: see pertinent history above  are also affecting patient's functional outcome.   REHAB POTENTIAL: Good  CLINICAL DECISION MAKING: Stable/uncomplicated  EVALUATION COMPLEXITY: Low   GOALS: Goals reviewed with patient? Yes  SHORT TERM GOALS: (target date for Short term goals are  3 weeks 02/17/23)  1. Patient will demonstrate independent use of home exercise program to maintain progress from in clinic treatments.  Goal status: MET 02/13/23  LONG TERM GOALS: (target dates for all long term goals are 10 weeks  04/07/23 )   1. Patient will demonstrate/report pain at worst less than or equal to 2/10 to facilitate minimal limitation in daily activity secondary to pain symptoms.  Goal status: New   2. Patient will demonstrate independent use of home exercise program to facilitate ability to maintain/progress functional gains from skilled physical therapy services.  Goal status: New   3. Patient will demonstrate FOTO outcome > or = 76 % to indicate reduced disability due to condition.  Goal status: New   4. Pt will improve bilateral trunk rotation to WNL for improved functional mobility.   Goal status: New   5.  Pt will be able to report sitting at her desk for 1 hour with pain </= 2/10 in her low back.    Goal status: New   6.  Pt will be able to walk for >/= 20 minutes with no pain noted in her low back.   Goal status: New     PLAN:  PT FREQUENCY: 1-2x/week  PT DURATION: 10 weeks  PLANNED INTERVENTIONS:  Therapeutic exercises, Therapeutic activity, Neuro Muscular re-education, Balance training, Gait training, Patient/Family education, Joint mobilization, Stair training, DME instructions, Dry Needling, Electrical stimulation, Cryotherapy, vasopneumatic device, Moist heat, Taping, Traction Ultrasound, Ionotophoresis 4mg /ml Dexamethasone, and aquatic therapy, Manual therapy.  All included unless contraindicated  PLAN FOR NEXT SESSION:  manual as needed, core strengthening, DN as needed.            Sharmon Leyden, PT, MPT 02/27/2023, 8:45 AM

## 2023-03-02 ENCOUNTER — Encounter: Payer: Self-pay | Admitting: Nurse Practitioner

## 2023-03-03 ENCOUNTER — Encounter: Payer: Self-pay | Admitting: Nurse Practitioner

## 2023-03-03 ENCOUNTER — Ambulatory Visit (INDEPENDENT_AMBULATORY_CARE_PROVIDER_SITE_OTHER): Payer: 59 | Admitting: Nurse Practitioner

## 2023-03-03 VITALS — BP 136/84 | HR 90 | Temp 97.3°F | Ht 64.03 in | Wt 281.0 lb

## 2023-03-03 DIAGNOSIS — Z124 Encounter for screening for malignant neoplasm of cervix: Secondary | ICD-10-CM

## 2023-03-03 DIAGNOSIS — Z0000 Encounter for general adult medical examination without abnormal findings: Secondary | ICD-10-CM | POA: Diagnosis not present

## 2023-03-03 NOTE — Progress Notes (Signed)
Provider: Sharon Seller, NP  [name], NP as PCP - General (Geriatric Medicine)  Extended Emergency Contact Information Primary Emergency Contact: Pogosyan,Jimmy Address: [phone]          [place], [place] [address] [place] Mobile Phone: [phone] Relation: Spouse Allergies  Allergen Reactions   Penicillins Other (See Comments)    Get yeast infection   Latex Rash   Code Status: FULL   Goals of Care: Advanced Directive information    03/03/2023    8:21 AM  Advanced Directives  Does Patient Have a Medical Advance Directive? Yes  Type of Estate agent of [place];Living will  Does patient want to make changes to medical advance directive? No - Patient declined  Copy of Healthcare Power of Attorney in Chart? No - copy requested     Chief Complaint  Patient presents with   Annual Exam    Yearly physical and pap smear. Discuss need for shingrix and additional covid boosters.     HPI: Patient is a 52 y.o. female seen in today for an wellness exam at [facility] She is up to date on colonoscopy and mammogram PAP today Gets mammogram through mobile van at work yearly  Up to date on labs.      01/18/2023    8:16 AM 12/21/2021    2:46 PM 12/10/2020    2:06 PM 11/26/2020    3:32 PM 09/20/2019   12:39 PM  Depression screen PHQ 2/9  Decreased Interest 0 0 0 0 0  Down, Depressed, Hopeless 0 0 0 0 0  PHQ - 2 Score 0 0 0 0 0  Altered sleeping  0 0 0   Tired, decreased energy  0 0 0   Change in appetite  0 0 0   Feeling bad or failure about yourself   0 0 0   Trouble concentrating  0 0 0   Moving slowly or fidgety/restless  0 0 0   Suicidal thoughts  0 0 0   PHQ-9 Score  0 0 0   Difficult doing work/chores  Not difficult at all          11/26/2020    3:32 PM 12/10/2020    2:06 PM 12/21/2021    2:46 PM 01/18/2023    8:16 AM 03/03/2023    8:28 AM  Fall Risk  Falls in the past year? 0 0 0 0 0  Was there an  injury with Fall?  0 0 0 0  Fall Risk Category Calculator  0 0 0 0  Fall Risk Category (Retired)  Low Low    (RETIRED) Patient Fall Risk Level  Low fall risk Low fall risk    Patient at Risk for Falls Due to   No Fall Risks No Fall Risks No Fall Risks  Fall risk Follow up Falls evaluation completed Falls evaluation completed Falls evaluation completed Falls evaluation completed Falls evaluation completed       No data to display           Health Maintenance  Topic Date Due   PAP SMEAR-Modifier  07/25/2022   Zoster Vaccines- Shingrix (1 of 2) Never done   INFLUENZA VACCINE  04/06/2023   MAMMOGRAM  06/15/2024   Colonoscopy  04/22/2028   DTaP/Tdap/Td (3 - Td or Tdap) 07/24/2028   COVID-19 Vaccine  Completed   Hepatitis C Screening  Completed   HIV Screening  Completed   HPV VACCINES  Aged Out    Past Medical History:  Diagnosis Date   GERD (gastroesophageal reflux disease)    H/O gestational diabetes mellitus, not currently pregnant    H/O iron deficiency anemia    due to donating blood   Seasonal allergies    Sleep apnea    uses CPAP    Past Surgical History:  Procedure Laterality Date   REDUCTION MAMMAPLASTY Bilateral 1991   TUBAL LIGATION Bilateral 09/05/2013   Procedure: POST PARTUM TUBAL LIGATION;  Surgeon: Esmeralda Arthur, MD;  Location: [facility];  Service: Gynecology;  Laterality: Bilateral;   WISDOM TOOTH EXTRACTION      Social History   Socioeconomic History   Marital status: Married    Spouse name: Not on file   Number of children: Not on file   Years of education: Not on file   Highest education level: Bachelor's degree (e.g., BA, AB, BS)  Occupational History   Occupation: Social worker  Tobacco Use   Smoking status: Never   Smokeless tobacco: Never  Vaping Use   Vaping Use: Never used  Substance and Sexual Activity   Alcohol use: No    Alcohol/week: 0.0 standard drinks of alcohol   Drug use: No   Sexual activity: Yes    Birth  control/protection: Surgical  Other Topics Concern   Not on file  Social History Narrative   Lives at home with husband and 3 children.    Social Determinants of Health   Financial Resource Strain: Low Risk  (02/27/2023)   Overall Financial Resource Strain (CARDIA)    Difficulty of Paying Living Expenses: Not hard at all  Food Insecurity: No Food Insecurity (02/27/2023)   Hunger Vital Sign    Worried About Running Out of Food in the Last Year: Never true    Ran Out of Food in the Last Year: Never true  Transportation Needs: No Transportation Needs (02/27/2023)   PRAPARE - Administrator, Civil Service (Medical): No    Lack of Transportation (Non-Medical): No  Physical Activity: Insufficiently Active (02/27/2023)   Exercise Vital Sign    Days of Exercise per Week: 2 days    Minutes of Exercise per Session: 30 min  Stress: No Stress Concern Present (02/27/2023)   [employer] of Occupational Health - Occupational Stress Questionnaire    Feeling of Stress : Only a little  Social Connections: Moderately Integrated (02/27/2023)   Social Connection and Isolation Panel [NHANES]    Frequency of Communication with Friends and Family: Once a week    Frequency of Social Gatherings with Friends and Family: Once a week    Attends Religious Services: More than 4 times per year    Active Member of [employer] or Organizations: Yes    Attends Engineer, structural: More than 4 times per year    Marital Status: Married    Family History  Problem Relation Age of Onset   Cancer Mother 52       breast cancer   Hypertension Mother    Early death Father    Heart disease Father    Heart attack Father    Aneurysm Father    Stroke Father    Hyperlipidemia Brother    Asthma Daughter    Mental retardation Paternal Aunt    Heart attack Maternal Grandmother    Heart attack Paternal Grandmother    Heart attack Paternal Grandfather    Colon polyps Neg Hx    Colon cancer Neg Hx  Esophageal cancer Neg Hx    Rectal cancer Neg Hx    Stomach cancer Neg Hx     Review of Systems:  Review of Systems  Constitutional:  Negative for chills, fever and weight loss.  HENT:  Negative for tinnitus.   Respiratory:  Negative for cough, sputum production and shortness of breath.   Cardiovascular:  Negative for chest pain, palpitations and leg swelling.  Gastrointestinal:  Negative for abdominal pain, constipation, diarrhea and heartburn.  Genitourinary:  Negative for dysuria, frequency and urgency.  Musculoskeletal:  Negative for back pain, falls, joint pain and myalgias.  Skin: Negative.   Neurological:  Negative for dizziness and headaches.  Psychiatric/Behavioral:  Negative for depression and memory loss. The patient does not have insomnia.      Allergies as of 03/03/2023       Reactions   Penicillins Other (See Comments)   Get yeast infection   Latex Rash        Medication List        Accurate as of March 03, 2023  8:39 AM. If you have any questions, ask your nurse or doctor.          cetirizine 10 MG chewable tablet Commonly known as: ZYRTEC Chew 10 mg by mouth daily as needed.   FISH OIL PO Take 1 capsule by mouth 2 (two) times a week.   fluticasone 50 MCG/ACT nasal spray Commonly known as: FLONASE Place 2 sprays into both nostrils daily.   MULTIVITAMIN PLUS IRON ADULT PO Take 1 tablet by mouth 2 (two) times a week.   MULTIVITAMIN PO Take 1 tablet by mouth daily.   Omeprazole 20 MG Tbec Take 1 tablet (20 mg total) by mouth daily before breakfast. For six weeks   RED YEAST RICE PO Take 1 capsule by mouth 2 (two) times a week.   VITAMIN D3 PO Take 1 tablet by mouth daily.          Physical Exam: Vitals:   03/03/23 0820  BP: 136/84  Pulse: 90  Temp: (!) 97.3 F (36.3 C)  TempSrc: Temporal  SpO2: 98%  Weight: 281 lb (127.5 kg)  Height: 5' 4.03" (1.626 m)   Body mass index is 48.2 kg/m. Wt Readings from Last 3 Encounters:   03/03/23 281 lb (127.5 kg)  01/18/23 278 lb (126.1 kg)  01/28/22 282 lb (127.9 kg)    Physical Exam Constitutional:      General: She is not in acute distress.    Appearance: She is well-developed. She is not diaphoretic.  HENT:     Head: Normocephalic and atraumatic.     Mouth/Throat:     Pharynx: No oropharyngeal exudate.  Eyes:     Conjunctiva/sclera: Conjunctivae normal.     Pupils: Pupils are equal, round, and reactive to light.  Cardiovascular:     Rate and Rhythm: Normal rate and regular rhythm.     Heart sounds: Normal heart sounds.  Pulmonary:     Effort: Pulmonary effort is normal.     Breath sounds: Normal breath sounds.  Abdominal:     General: Bowel sounds are normal.     Palpations: Abdomen is soft.  Musculoskeletal:     Cervical back: Normal range of motion and neck supple.     Right lower leg: No edema.     Left lower leg: No edema.  Skin:    General: Skin is warm and dry.  Neurological:     Mental Status: She is alert.  Psychiatric:        Mood and Affect: Mood normal.     Labs reviewed: Basic Metabolic Panel: Recent Labs    01/18/23 0904  NA 139  K 4.3  CL 106  CO2 26  GLUCOSE 83  BUN 13  CREATININE 0.72  CALCIUM 8.9  TSH 1.25   Liver Function Tests: Recent Labs    01/18/23 0904  AST 10  ALT 11  BILITOT 0.4  PROT 6.4   No results for input(s): "LIPASE", "AMYLASE" in the last 8760 hours. No results for input(s): "AMMONIA" in the last 8760 hours. CBC: Recent Labs    01/18/23 0904  WBC 7.9  NEUTROABS 5,064  HGB 12.3  HCT 38.2  MCV 84.5  PLT 360   Lipid Panel: Recent Labs    01/18/23 0904  CHOL 181  HDL 42*  LDLCALC 116*  TRIG 121  CHOLHDL 4.3   Lab Results  Component Value Date   HGBA1C 5.5 01/18/2023    Procedures: No results found.  Assessment/Plan 1. Routine general medical examination at a health care facility -wellness visit completed today, The patient was counseled regarding the appropriate use of  alcohol, regular self-examination of the breasts on a monthly basis, prevention of dental and periodontal disease, diet, regular sustained exercise for at least 30 minutes 5 times per week, routine screening interval for mammogram as recommended by the American Cancer Society and ACOG, importance of regular PAP smears, and recommended schedule for GI hemoccult testing, colonoscopy, cholesterol, thyroid and diabetes screening. - Lipid panel; Future - COMPLETE METABOLIC PANEL WITH GFR; Future - CBC with Differential/Platelet; Future - Hemoglobin A1c; Future  2. Cervical cancer screening  Pap IG w/ reflex to HPV when ASC-U   Next appt: yearly, labs prior  Shambria Camerer K. Biagio Borg  [facility] [phone]

## 2023-03-03 NOTE — Patient Instructions (Signed)
Health Maintenance, Female Adopting a healthy lifestyle and getting preventive care are important in promoting health and wellness. Ask your health care provider about: The right schedule for you to have regular tests and exams. Things you can do on your own to prevent diseases and keep yourself healthy. What should I know about diet, weight, and exercise? Eat a healthy diet  Eat a diet that includes plenty of vegetables, fruits, low-fat dairy products, and lean protein. Do not eat a lot of foods that are high in solid fats, added sugars, or sodium. Maintain a healthy weight Body mass index (BMI) is used to identify weight problems. It estimates body fat based on height and weight. Your health care provider can help determine your BMI and help you achieve or maintain a healthy weight. Get regular exercise Get regular exercise. This is one of the most important things you can do for your health. Most adults should: Exercise for at least 150 minutes each week. The exercise should increase your heart rate and make you sweat (moderate-intensity exercise). Do strengthening exercises at least twice a week. This is in addition to the moderate-intensity exercise. Spend less time sitting. Even light physical activity can be beneficial. Watch cholesterol and blood lipids Have your blood tested for lipids and cholesterol at 50 years of age, then have this test every 5 years. Have your cholesterol levels checked more often if: Your lipid or cholesterol levels are high. You are older than 50 years of age. You are at high risk for heart disease. What should I know about cancer screening? Depending on your health history and family history, you may need to have cancer screening at various ages. This may include screening for: Breast cancer. Cervical cancer. Colorectal cancer. Skin cancer. Lung cancer. What should I know about heart disease, diabetes, and high blood pressure? Blood pressure and heart  disease High blood pressure causes heart disease and increases the risk of stroke. This is more likely to develop in people who have high blood pressure readings or are overweight. Have your blood pressure checked: Every 3-5 years if you are 18-39 years of age. Every year if you are 40 years old or older. Diabetes Have regular diabetes screenings. This checks your fasting blood sugar level. Have the screening done: Once every three years after age 40 if you are at a normal weight and have a low risk for diabetes. More often and at a younger age if you are overweight or have a high risk for diabetes. What should I know about preventing infection? Hepatitis B If you have a higher risk for hepatitis B, you should be screened for this virus. Talk with your health care provider to find out if you are at risk for hepatitis B infection. Hepatitis C Testing is recommended for: Everyone born from 1945 through 1965. Anyone with known risk factors for hepatitis C. Sexually transmitted infections (STIs) Get screened for STIs, including gonorrhea and chlamydia, if: You are sexually active and are younger than 50 years of age. You are older than 50 years of age and your health care provider tells you that you are at risk for this type of infection. Your sexual activity has changed since you were last screened, and you are at increased risk for chlamydia or gonorrhea. Ask your health care provider if you are at risk. Ask your health care provider about whether you are at high risk for HIV. Your health care provider may recommend a prescription medicine to help prevent HIV   infection. If you choose to take medicine to prevent HIV, you should first get tested for HIV. You should then be tested every 3 months for as long as you are taking the medicine. Pregnancy If you are about to stop having your period (premenopausal) and you may become pregnant, seek counseling before you get pregnant. Take 400 to 800  micrograms (mcg) of folic acid every day if you become pregnant. Ask for birth control (contraception) if you want to prevent pregnancy. Osteoporosis and menopause Osteoporosis is a disease in which the bones lose minerals and strength with aging. This can result in bone fractures. If you are 36 years old or older, or if you are at risk for osteoporosis and fractures, ask your health care provider if you should: Be screened for bone loss. Take a calcium or vitamin D supplement to lower your risk of fractures. Be given hormone replacement therapy (HRT) to treat symptoms of menopause. Follow these instructions at home: Alcohol use Do not drink alcohol if: Your health care provider tells you not to drink. You are pregnant, may be pregnant, or are planning to become pregnant. If you drink alcohol: Limit how much you have to: 0-1 drink a day. Know how much alcohol is in your drink. In the [place] one drink equals one 12 oz bottle of beer (355 mL), one 5 oz glass of wine (148 mL), or one 1 oz glass of hard liquor (44 mL). Lifestyle Do not use any products that contain nicotine or tobacco. These products include cigarettes, chewing tobacco, and vaping devices, such as e-cigarettes. If you need help quitting, ask your health care provider. Do not use street drugs. Do not share needles. Ask your health care provider for help if you need support or information about quitting drugs. General instructions Schedule regular health, dental, and eye exams. Stay current with your vaccines. Tell your health care provider if: You often feel depressed. You have ever been abused or do not feel safe at home. Summary Adopting a healthy lifestyle and getting preventive care are important in promoting health and wellness. Follow your health care provider's instructions about healthy diet, exercising, and getting tested or screened for diseases. Follow your health care provider's instructions on monitoring your  cholesterol and blood pressure. This information is not intended to replace advice given to you by your health care provider. Make sure you discuss any questions you have with your health care provider. Document Revised: 01/11/2021 Document Reviewed: 01/11/2021 Elsevier Patient Education  2024 [employer]     Why follow it? Research shows. Those who follow the Mediterranean diet have a reduced risk of heart disease  The diet is associated with a reduced incidence of Parkinson's and Alzheimer's diseases People following the diet may have longer life expectancies and lower rates of chronic diseases  The Dietary Guidelines for Americans recommends the Mediterranean diet as an eating plan to promote health and prevent disease  What Is the Mediterranean Diet?  Healthy eating plan based on typical foods and recipes of Mediterranean-style cooking The diet is primarily a plant based diet; these foods should make up a majority of meals   Starches - Plant based foods should make up a majority of meals - They are an important sources of vitamins, minerals, energy, antioxidants, and fiber - Choose whole grains, foods high in fiber and minimally processed items  - Typical grain sources include wheat, oats, barley, corn, brown rice, bulgar, farro, millet, polenta, couscous  - Various types of beans  include chickpeas, lentils, fava beans, black beans, white beans   Fruits  Veggies - Large quantities of antioxidant rich fruits & veggies; 6 or more servings  - Vegetables can be eaten raw or lightly drizzled with oil and cooked  - Vegetables common to the traditional Mediterranean Diet include: artichokes, arugula, beets, broccoli, brussel sprouts, cabbage, carrots, celery, collard greens, cucumbers, eggplant, kale, leeks, lemons, lettuce, mushrooms, okra, onions, peas, peppers, potatoes, pumpkin, radishes, rutabaga, shallots, spinach, sweet potatoes, turnips, zucchini - Fruits common to the Mediterranean  Diet include: apples, apricots, avocados, cherries, clementines, dates, figs, grapefruits, grapes, melons, nectarines, oranges, peaches, pears, pomegranates, strawberries, tangerines  Fats - Replace butter and margarine with healthy oils, such as olive oil, canola oil, and tahini  - Limit nuts to no more than a handful a day  - Nuts include walnuts, almonds, pecans, pistachios, pine nuts  - Limit or avoid candied, honey roasted or heavily salted nuts - Olives are central to the Mediterranean diet - can be eaten whole or used in a variety of dishes   Meats Protein - Limiting red meat: no more than a few times a month - When eating red meat: choose lean cuts and keep the portion to the size of deck of cards - Eggs: approx. 0 to 4 times a week  - Fish and lean poultry: at least 2 a week  - Healthy protein sources include, chicken, turkey, lean beef, lamb - Increase intake of seafood such as tuna, salmon, trout, mackerel, shrimp, scallops - Avoid or limit high fat processed meats such as sausage and bacon  Dairy - Include moderate amounts of low fat dairy products  - Focus on healthy dairy such as fat free yogurt, skim milk, low or reduced fat cheese - Limit dairy products higher in fat such as whole or 2% milk, cheese, ice cream  Alcohol - Moderate amounts of red wine is ok  - No more than 5 oz daily for women (all ages) and men older than age 65  - No more than 10 oz of wine daily for men younger than 65  Other - Limit sweets and other desserts  - Use herbs and spices instead of salt to flavor foods  - Herbs and spices common to the traditional Mediterranean Diet include: basil, bay leaves, chives, cloves, cumin, fennel, garlic, lavender, marjoram, mint, oregano, parsley, pepper, rosemary, sage, savory, sumac, tarragon, thyme   It's not just a diet, it's a lifestyle:  The Mediterranean diet includes lifestyle factors typical of those in the region  Foods, drinks and meals are best eaten with  others and savored Daily physical activity is important for overall good health This could be strenuous exercise like running and aerobics This could also be more leisurely activities such as walking, housework, yard-work, or taking the stairs Moderation is the key; a balanced and healthy diet accommodates most foods and drinks Consider portion sizes and frequency of consumption of certain foods   Meal Ideas & Options:  Breakfast:  Whole wheat toast or whole wheat English muffins with peanut butter & hard boiled egg Steel cut oats topped with apples & cinnamon and skim milk  Fresh fruit: banana, strawberries, melon, berries, peaches  Smoothies: strawberries, bananas, greek yogurt, peanut butter Low fat greek yogurt with blueberries and granola  Egg white omelet with spinach and mushrooms Breakfast couscous: whole wheat couscous, apricots, skim milk, cranberries  Sandwiches:  Hummus and grilled vegetables (peppers, zucchini, squash) on whole wheat bread   Grilled   chicken on whole wheat pita with lettuce, tomatoes, cucumbers or tzatziki  [place] salad on whole wheat bread: tuna salad made with greek yogurt, olives, red peppers, capers, green onions Garlic rosemary lamb pita: lamb sauted with garlic, rosemary, salt & pepper; add lettuce, cucumber, greek yogurt to pita - flavor with lemon juice and black pepper  Seafood:  Mediterranean grilled salmon, seasoned with garlic, basil, parsley, lemon juice and black pepper Shrimp, lemon, and spinach whole-grain pasta salad made with low fat greek yogurt  Seared scallops with lemon orzo  Seared tuna steaks seasoned salt, pepper, coriander topped with tomato mixture of olives, tomatoes, olive oil, minced garlic, parsley, green onions and cappers  Meats:  Herbed greek chicken salad with kalamata olives, cucumber, feta  Red bell peppers stuffed with spinach, bulgur, lean ground beef (or lentils) & topped with feta   Kebabs: skewers of chicken, tomatoes,  onions, zucchini, squash  [place] burgers: made with red onions, mint, dill, lemon juice, feta cheese topped with roasted red peppers Vegetarian Cucumber salad: cucumbers, artichoke hearts, celery, red onion, feta cheese, tossed in olive oil & lemon juice  Hummus and whole grain pita points with a greek salad (lettuce, tomato, feta, olives, cucumbers, red onion) Lentil soup with celery, carrots made with vegetable broth, garlic, salt and pepper  Tabouli salad: parsley, bulgur, mint, scallions, cucumbers, tomato, radishes, lemon juice, olive oil, salt and pepper.      American Heart Association (AHA) Exercise Recommendation  Being physically active is important to prevent heart disease and stroke, the nation's No. 1and No. 5killers. To improve overall cardiovascular health, we suggest at least 150 minutes per week of moderate exercise or 75 minutes per week of vigorous exercise (or a combination of moderate and vigorous activity). Thirty minutes a day, five times a week is an easy goal to remember. You will also experience benefits even if you divide your time into two or three segments of 10 to 15 minutes per day.  For people who would benefit from lowering their blood pressure or cholesterol, we recommend 40 minutes of aerobic exercise of moderate to vigorous intensity three to four times a week to lower the risk for heart attack and stroke.  Physical activity is anything that makes you move your body and burn calories.  This includes things like climbing stairs or playing sports. Aerobic exercises benefit your heart, and include walking, jogging, swimming or biking. Strength and stretching exercises are best for overall stamina and flexibility.  The simplest, positive change you can make to effectively improve your heart health is to start walking. It's enjoyable, free, easy, social and great exercise. A walking program is flexible and boasts high success rates because people can stick with it.  It's easy for walking to become a regular and satisfying part of life.   For Overall Cardiovascular Health: At least 30 minutes of moderate-intensity aerobic activity at least 5 days per week for a total of 150  OR  At least 25 minutes of vigorous aerobic activity at least 3 days per week for a total of 75 minutes; or a combination of moderate- and vigorous-intensity aerobic activity  AND  Moderate- to high-intensity muscle-strengthening activity at least 2 days per week for additional health benefits.  For Lowering Blood Pressure and Cholesterol An average 40 minutes of moderate- to vigorous-intensity aerobic activity 3 or 4 times per week  What if I can't make it to the time goal? Something is always better than nothing! And everyone has to  start somewhere. Even if you've been sedentary for years, today is the day you can begin to make healthy changes in your life. If you don't think you'll make it for 30 or 40 minutes, set a reachable goal for today. You can work up toward your overall goal by increasing your time as you get stronger. Don't let all-or-nothing thinking rob you of doing what you can every day.  Source:http://www.heart.org

## 2023-03-08 LAB — PAP IG W/ RFLX HPV ASCU

## 2023-03-13 ENCOUNTER — Encounter: Payer: Self-pay | Admitting: Physical Therapy

## 2023-03-13 ENCOUNTER — Ambulatory Visit (INDEPENDENT_AMBULATORY_CARE_PROVIDER_SITE_OTHER): Payer: 59 | Admitting: Physical Therapy

## 2023-03-13 DIAGNOSIS — R262 Difficulty in walking, not elsewhere classified: Secondary | ICD-10-CM | POA: Diagnosis not present

## 2023-03-13 DIAGNOSIS — M5459 Other low back pain: Secondary | ICD-10-CM | POA: Diagnosis not present

## 2023-03-13 DIAGNOSIS — R293 Abnormal posture: Secondary | ICD-10-CM | POA: Diagnosis not present

## 2023-03-13 DIAGNOSIS — M6281 Muscle weakness (generalized): Secondary | ICD-10-CM | POA: Diagnosis not present

## 2023-03-13 NOTE — Therapy (Signed)
OUTPATIENT PHYSICAL THERAPY THORACOLUMBAR  TREATMENT NOTE   Patient Name: Carol Parks MRN: [id number] DOB:[birth year], 52 y.o., female Today's Date: 03/13/2023  END OF SESSION:  PT End of Session - 03/13/23 1601     Visit Number 6    Number of Visits 20    Date for PT Re-Evaluation 04/07/23    PT Start Time 1550    PT Stop Time 1630    PT Time Calculation (min) 40 min    Activity Tolerance Patient tolerated treatment well    Behavior During Therapy WFL for tasks assessed/performed               Past Medical History:  Diagnosis Date   GERD (gastroesophageal reflux disease)    H/O gestational diabetes mellitus, not currently pregnant    H/O iron deficiency anemia    due to donating blood   Seasonal allergies    Sleep apnea    uses CPAP   Past Surgical History:  Procedure Laterality Date   REDUCTION MAMMAPLASTY Bilateral 1991   TUBAL LIGATION Bilateral 09/05/2013   Procedure: POST PARTUM TUBAL LIGATION;  Surgeon: Esmeralda Arthur, MD;  Location: [facility];  Service: Gynecology;  Laterality: Bilateral;   WISDOM TOOTH EXTRACTION     Patient Active Problem List   Diagnosis Date Noted   Acute non-recurrent pansinusitis 01/31/2022   Nasal congestion 01/31/2022   OSA on CPAP 05/31/2021   Hiatal hernia with GERD 05/13/2017   Latex allergy 09/03/2013   Hx of reduction mammoplasty 09/03/2013    PCP: Sharon Seller, NP )  REFERRING PROVIDER: Sharon Seller, NP   REFERRING DIAG: M54.50,G89.29 (ICD-10-CM) - Chronic bilateral low back pain without sciatica  Rationale for Evaluation and Treatment: Rehabilitation  THERAPY DIAG:  Other low back pain  Difficulty in walking, not elsewhere classified  Abnormal posture  Muscle weakness (generalized)  ONSET DATE: about a month ago                                                                                                                                                                                           SUBJECTIVE:  SUBJECTIVE STATEMENT: No pain reported upon arrival. Pt stating she was on vacation last week and she said she didn't sleep well.  PERTINENT HISTORY:  GERD, iron deficiency anemia, sleep apnea  PAIN:  NPRS scale: none at rest Pain location: low back Pain description: achy Aggravating factors: bending, prolonged sitting Relieving factors: heating pad, aleve  PRECAUTIONS: None  WEIGHT BEARING RESTRICTIONS: No  FALLS:  Has patient fallen in last 6 months? 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  PLOF: Independent  PATIENT GOALS: Lose Weight, work without pain  Next MD Visit:    OBJECTIVE:   DIAGNOSTIC FINDINGS:  01/25/23: no images found  PATIENT SURVEYS:  01/25/23: FOTO eval:  58  predicted:  76 03/13/23: FOTO:  66%  SCREENING FOR RED FLAGS: Bowel or bladder incontinence: No Cauda equina syndrome: No  COGNITION: Overall cognitive status: WFL normal      SENSATION: 01/25/23: WFL  MUSCLE LENGTH: Hamstrings: Right 55 deg; Left 60 deg   POSTURE: rounded shoulders, forward head, and increased lumbar lordosis  PALPATION: 01/25/23: TTP, bilateral lumbar paraspinals, bilateral QL, bilateral piriformis  LUMBAR ROM:   AROM 01/25/23 02/13/23  Flexion 65 76  Extension 22 32  Right lateral flexion 32 36  Left lateral flexion 25 34  Right rotation Limited 50% Limited 25%  Left rotation Limited 50% WFL   (Blank rows = not tested)  LOWER EXTREMITY ROM:       Right 01/25/23 Left 01/25/23 Rt : Left 03/13/23  Hip flexion A: 102 A: 100 A: 105 : 102 P: 110 : 115  Hip  extension     Hip abduction     Hip adduction     Hip internal rotation     Hip external rotation     Knee flexion     Knee extension     Ankle dorsiflexion     Ankle plantarflexion     Ankle inversion     Ankle eversion      (Blank rows = not tested)  LOWER EXTREMITY MMT:    MMT Right 01/25/23 Left 01/25/23  Hip flexion 5 4  Hip extension    Hip abduction 5 5  Hip adduction 5 5  Hip internal rotation    Hip external rotation    Knee flexion 5 5  Knee extension 5 5  Ankle dorsiflexion    Ankle plantarflexion    Ankle inversion    Ankle eversion     (Blank rows = not tested)  LUMBAR SPECIAL TESTS:  01/25/23: Slump test: Negative  FUNCTIONAL TESTS:   01/25/23: 5 times sit to stand: 13 seconds no UE support  GAIT: Distance walked: 30 feet  Assistive device utilized: None Level of assistance: Complete Independence Comments: anterior pelvic tilt, wide BOS ____________________________________________________________  TODAY'S TREATMENT:  DATE:  03/13/23:  TherEx:  Nustep: Level 3 x 6 minutes UE/LE QL door stretch: x 4 holding 20 sec Hip hiking: x 10 each LE Mini squats with red physio-ball in lumbar curvature for posture support x 15  Supine bridges c ball squeeze x 10 holding 5 sec Cat/Cow: x 10 holding 10 seconds Prone: hip extension: 2 x 10 bil LE BATCA: 20# 2 x 10 holding 3 sec Leg Press: 87# bil LE's x 15, single LE: 43#  x 10   Measurements performed and FOTO repeated     02/27/23:  TherEx:  Nustep: Level 3 x 6 minutes UE/LE Supine: trunk rotation: x 3 holding 20 seconds bil Supine thoracic rotation x 3 holding 30 sec Supine bridges: x 10 holding 10 sec Supine bridges c clam shells  2 x 10  BATCA: 20# 2 x 10 holding 3 sec Leg Press: 75# bil LE's x 15, single LE: 37#  x 10    02/21/23:  TherEx:  Nustep: Level 3 x 6 minutes  UE/LE Prone: hip extension Prone: knees flexed 90 degs, trunk rotation Quadraped: cat/cow x 10 holding 10 sec Supine bridges: x 10 holding 10 sec Piriformis stretch: x 2 each LE Leg Press: 50# bil LE's x 15  Manual:  Skilled palpation during TPDN of active trigger points Trigger Point Dry-Needling  Treatment instructions: Expect mild to moderate muscle soreness. S/S of pneumothorax if dry needled over a lung field, and to seek immediate medical attention should they occur. Patient verbalized understanding of these instructions and education. Patient Consent Given: Yes Education handout provided: Previously provided Muscles treated: bil lumbar multifidi, bilateral piriformis Treatment response/outcome: multiple twitch responses noted         PATIENT EDUCATION:  Education details: HEP, POC Person educated: Patient Education method: Programmer, multimedia, Demonstration, Verbal cues, and Handouts Education comprehension: verbalized understanding, returned demonstration, and verbal cues required  HOME EXERCISE PROGRAM: Access Code: [email or web address] URL: [email or web address] Date: 01/25/2023 Prepared by: Narda Amber  Exercises - Supine Posterior Pelvic Tilt  - 2 x daily - 7 x weekly - 2 sets - 10 reps - 5 seconds hold - Bridge  - 2 x daily - 7 x weekly - 2 sets - 10 reps - 5 seconds hold - Supine Figure 4 Piriformis Stretch  - 2 x daily - 7 x weekly - 3 reps - 30 seconds hold - Supine Piriformis Stretch with Foot on Ground  - 2 x daily - 7 x weekly - 3 reps - 30 seconds hold - Seated Hamstring Stretch  - 2 x daily - 7 x weekly - 3 reps - 30 seconds hold  ASSESSMENT:  CLINICAL IMPRESSION: Pt tolerating all exercises well today. Pt's FOTO improved from 58% to 66% since her initial evaluation. Treatment focusing on overall ROM and strengthening.  Continue to progress to maximize pt's function and pt's LTG's.     Recommend continue skilled PT interventions to maximize pt's  function.     OBJECTIVE IMPAIRMENTS: decreased activity tolerance, decreased balance, difficulty walking, decreased ROM, decreased strength, obesity, and pain.   ACTIVITY LIMITATIONS: lifting, bending, sitting, standing, squatting, and transfers  PARTICIPATION LIMITATIONS: cleaning, driving, community activity, and occupation  PERSONAL FACTORS: 3+ comorbidities: see pertinent history above  are also affecting patient's functional outcome.   REHAB POTENTIAL: Good  CLINICAL DECISION MAKING: Stable/uncomplicated  EVALUATION COMPLEXITY: Low   GOALS: Goals reviewed with patient? Yes  SHORT TERM GOALS: (target date for Short term goals are 3 weeks 02/17/23)  1. Patient  will demonstrate independent use of home exercise program to maintain progress from in clinic treatments.  Goal status: MET 02/13/23  LONG TERM GOALS: (target dates for all long term goals are 10 weeks  04/07/23 )   1. Patient will demonstrate/report pain at worst less than or equal to 2/10 to facilitate minimal limitation in daily activity secondary to pain symptoms.  Goal status: New   2. Patient will demonstrate independent use of home exercise program to facilitate ability to maintain/progress functional gains from skilled physical therapy services.  Goal status: New   3. Patient will demonstrate FOTO outcome > or = 76 % to indicate reduced disability due to condition.  Goal status: New   4. Pt will improve bilateral trunk rotation to WNL for improved functional mobility.   Goal status: New   5.  Pt will be able to report sitting at her desk for 1 hour with pain </= 2/10 in her low back.    Goal status: New   6.  Pt will be able to walk for >/= 20 minutes with no pain noted in her low back.   Goal status: New     PLAN:  PT FREQUENCY: 1-2x/week  PT DURATION: 10 weeks  PLANNED INTERVENTIONS: Therapeutic exercises, Therapeutic activity, Neuro Muscular re-education, Balance training, Gait training,  Patient/Family education, Joint mobilization, Stair training, DME instructions, Dry Needling, Electrical stimulation, Cryotherapy, vasopneumatic device, Moist heat, Taping, Traction Ultrasound, Ionotophoresis 4mg /ml Dexamethasone, and aquatic therapy, Manual therapy.  All included unless contraindicated  PLAN FOR NEXT SESSION:  manual as needed, core strengthening, DN as needed.            Sharmon Leyden, PT, MPT 03/13/2023, 4:02 PM

## 2023-03-20 ENCOUNTER — Ambulatory Visit: Payer: 59 | Admitting: Physical Therapy

## 2023-03-20 ENCOUNTER — Encounter: Payer: Self-pay | Admitting: Physical Therapy

## 2023-03-20 DIAGNOSIS — R293 Abnormal posture: Secondary | ICD-10-CM | POA: Diagnosis not present

## 2023-03-20 DIAGNOSIS — R262 Difficulty in walking, not elsewhere classified: Secondary | ICD-10-CM

## 2023-03-20 DIAGNOSIS — M6281 Muscle weakness (generalized): Secondary | ICD-10-CM

## 2023-03-20 DIAGNOSIS — M5459 Other low back pain: Secondary | ICD-10-CM | POA: Diagnosis not present

## 2023-03-20 NOTE — Therapy (Signed)
OUTPATIENT PHYSICAL THERAPY THORACOLUMBAR  TREATMENT NOTE   Patient Name: Carol Parks MRN: [id number] DOB:Mar 08, 1971, 52 y.o., female Today's Date: 03/20/2023  END OF SESSION:  PT End of Session - 03/20/23 0819     Visit Number 7    Number of Visits 20    Date for PT Re-Evaluation 04/07/23    PT Start Time 0805    PT Stop Time 0845    PT Time Calculation (min) 40 min    Activity Tolerance Patient tolerated treatment well    Behavior During Therapy WFL for tasks assessed/performed               Past Medical History:  Diagnosis Date   GERD (gastroesophageal reflux disease)    H/O gestational diabetes mellitus, not currently pregnant    H/O iron deficiency anemia    due to donating blood   Seasonal allergies    Sleep apnea    uses CPAP   Past Surgical History:  Procedure Laterality Date   REDUCTION MAMMAPLASTY Bilateral 1991   TUBAL LIGATION Bilateral 09/05/2013   Procedure: POST PARTUM TUBAL LIGATION;  Surgeon: Esmeralda Arthur, MD;  Location: [facility];  Service: Gynecology;  Laterality: Bilateral;   WISDOM TOOTH EXTRACTION     Patient Active Problem List   Diagnosis Date Noted   Acute non-recurrent pansinusitis 01/31/2022   Nasal congestion 01/31/2022   OSA on CPAP 05/31/2021   Hiatal hernia with GERD 05/13/2017   Latex allergy 09/03/2013   Hx of reduction mammoplasty 09/03/2013    PCP: Sharon Seller, NP )  REFERRING PROVIDER: Sharon Seller, NP   REFERRING DIAG: M54.50,G89.29 (ICD-10-CM) - Chronic bilateral low back pain without sciatica  Rationale for Evaluation and Treatment: Rehabilitation  THERAPY DIAG:  Other low back pain  Difficulty in walking, not elsewhere classified  Abnormal posture  Muscle weakness (generalized)  ONSET DATE: about a month ago                                                                                                                                                                                           SUBJECTIVE:  SUBJECTIVE STATEMENT: Pt stating she has not been as consistent this week with her HEP. Pt reporting 2/10 pain in her Rt glutes.   PERTINENT HISTORY:  GERD, iron deficiency anemia, sleep apnea  PAIN:  NPRS scale:2/10 Pain location: Rt glutes Pain description: achy Aggravating factors: bending, prolonged sitting Relieving factors: heating pad, aleve  PRECAUTIONS: None  WEIGHT BEARING RESTRICTIONS: No  FALLS:  Has patient fallen in last 6 months? 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  PLOF: Independent  PATIENT GOALS: Lose Weight, work without pain  Next MD Visit:    OBJECTIVE:   DIAGNOSTIC FINDINGS:  01/25/23: no images found  PATIENT SURVEYS:  01/25/23: FOTO eval:  58  predicted:  76 03/13/23: FOTO:  66%  SCREENING FOR RED FLAGS: Bowel or bladder incontinence: No Cauda equina syndrome: No  COGNITION: Overall cognitive status: WFL normal      SENSATION: 01/25/23: WFL  MUSCLE LENGTH: Hamstrings: Right 55 deg; Left 60 deg   POSTURE: rounded shoulders, forward head, and increased lumbar lordosis  PALPATION: 01/25/23: TTP, bilateral lumbar paraspinals, bilateral QL, bilateral piriformis  LUMBAR ROM:   AROM 01/25/23 02/13/23  Flexion 65 76  Extension 22 32  Right lateral flexion 32 36  Left lateral flexion 25 34  Right rotation Limited 50% Limited 25%  Left rotation Limited 50% WFL   (Blank rows = not tested)  LOWER EXTREMITY ROM:       Right 01/25/23 Left 01/25/23 Rt : Left 03/13/23  Hip flexion A: 102 A: 100 A: 105 : 102 P: 110 : 115  Hip extension      Hip abduction     Hip adduction     Hip internal rotation     Hip external rotation     Knee flexion     Knee extension     Ankle dorsiflexion     Ankle plantarflexion     Ankle inversion     Ankle eversion      (Blank rows = not tested)  LOWER EXTREMITY MMT:    MMT Right 01/25/23 Left 01/25/23  Hip flexion 5 4  Hip extension    Hip abduction 5 5  Hip adduction 5 5  Hip internal rotation    Hip external rotation    Knee flexion 5 5  Knee extension 5 5  Ankle dorsiflexion    Ankle plantarflexion    Ankle inversion    Ankle eversion     (Blank rows = not tested)  LUMBAR SPECIAL TESTS:  01/25/23: Slump test: Negative  FUNCTIONAL TESTS:   01/25/23: 5 times sit to stand: 13 seconds no UE support  GAIT: Distance walked: 30 feet  Assistive device utilized: None Level of assistance: Complete Independence Comments: anterior pelvic tilt, wide BOS ____________________________________________________________  TODAY'S TREATMENT:  DATE:  03/20/23:  TherEx:  Nustep: Level 3 x 6 minutes UE/LE QL door stretch: x 4 holding 20 sec Supine piriformis stretch: x 2 on each LE x 30 sec Supine glute stretch pushing knee away in figure 4 x 2 each LE x 30 sec Seated lumbar forward flexion using green physio-ball x 10 holidng 10 sec Seated QL stretch using green physio-ball x 5 each side holding 5-10 sec Standing trunk extension elbows on the wall x 10 holding 10 sec Manual Trigger Point Dry-Needling  Treatment instructions: Expect mild to moderate muscle soreness. S/S of pneumothorax if dry needled over a lung field, and to seek immediate medical attention should they occur. Patient verbalized understanding of these instructions and education.  Patient Consent Given: Yes Education handout provided: Previously provided Muscles treated: Rt glute max, glut med,  piriformis Electrical stimulation performed: No Parameters: N/A Treatment response/outcome: twitch response noted, pt reporting muscles feel less tense      03/13/23:  TherEx:  Nustep: Level 3 x 6 minutes UE/LE QL door stretch: x 4 holding 20 sec Hip hiking: x 10 each LE Mini squats with red physio-ball in lumbar curvature for posture support x 15  Supine bridges c ball squeeze x 10 holding 5 sec Cat/Cow: x 10 holding 10 seconds Prone: hip extension: 2 x 10 bil LE BATCA: 20# 2 x 10 holding 3 sec Leg Press: 87# bil LE's x 15, single LE: 43#  x 10   Measurements performed and FOTO repeated     02/27/23:  TherEx:  Nustep: Level 3 x 6 minutes UE/LE Supine: trunk rotation: x 3 holding 20 seconds bil Supine thoracic rotation x 3 holding 30 sec Supine bridges: x 10 holding 10 sec Supine bridges c clam shells  2 x 10  BATCA: 20# 2 x 10 holding 3 sec Leg Press: 75# bil LE's x 15, single LE: 37#  x 10    02/21/23:  TherEx:  Nustep: Level 3 x 6 minutes UE/LE Prone: hip extension Prone: knees flexed 90 degs, trunk rotation Quadraped: cat/cow x 10 holding 10 sec Supine bridges: x 10 holding 10 sec Piriformis stretch: x 2 each LE Leg Press: 50# bil LE's x 15  Manual:  Skilled palpation during TPDN of active trigger points Trigger Point Dry-Needling  Treatment instructions: Expect mild to moderate muscle soreness. S/S of pneumothorax if dry needled over a lung field, and to seek immediate medical attention should they occur. Patient verbalized understanding of these instructions and education. Patient Consent Given: Yes Education handout provided: Previously provided Muscles treated: bil lumbar multifidi, bilateral piriformis Treatment response/outcome: multiple twitch responses noted         PATIENT EDUCATION:  Education details: HEP, POC Person educated: Patient Education method: Programmer, multimedia, Demonstration, Verbal cues, and Handouts Education comprehension:  verbalized understanding, returned demonstration, and verbal cues required  HOME EXERCISE PROGRAM: Access Code: [email or web address] URL: [email or web address] Date: 01/25/2023 Prepared by: Narda Amber  Exercises - Supine Posterior Pelvic Tilt  - 2 x daily - 7 x weekly - 2 sets - 10 reps - 5 seconds hold - Bridge  - 2 x daily - 7 x weekly - 2 sets - 10 reps - 5 seconds hold - Supine Figure 4 Piriformis Stretch  - 2 x daily - 7 x weekly - 3 reps - 30 seconds hold - Supine Piriformis Stretch with Foot on Ground  - 2 x daily - 7 x weekly - 3 reps - 30 seconds hold - Seated Hamstring Stretch  -  2 x daily - 7 x weekly - 3 reps - 30 seconds hold  ASSESSMENT:  CLINICAL IMPRESSION: Pt arriving today reporting 2/10 pain in her glutes more on the right side. Pt reporting less tenderness following needling. Continue skilled PT to maximize pt's function.     Recommend continue skilled PT interventions to maximize pt's function.     OBJECTIVE IMPAIRMENTS: decreased activity tolerance, decreased balance, difficulty walking, decreased ROM, decreased strength, obesity, and pain.   ACTIVITY LIMITATIONS: lifting, bending, sitting, standing, squatting, and transfers  PARTICIPATION LIMITATIONS: cleaning, driving, community activity, and occupation  PERSONAL FACTORS: 3+ comorbidities: see pertinent history above  are also affecting patient's functional outcome.   REHAB POTENTIAL: Good  CLINICAL DECISION MAKING: Stable/uncomplicated  EVALUATION COMPLEXITY: Low   GOALS: Goals reviewed with patient? Yes  SHORT TERM GOALS: (target date for Short term goals are 3 weeks 02/17/23)  1. Patient will demonstrate independent use of home exercise program to maintain progress from in clinic treatments.  Goal status: MET 02/13/23  LONG TERM GOALS: (target dates for all long term goals are 10 weeks  04/07/23 )   1. Patient will demonstrate/report pain at worst less than or equal to 2/10 to  facilitate minimal limitation in daily activity secondary to pain symptoms.  Goal status: New   2. Patient will demonstrate independent use of home exercise program to facilitate ability to maintain/progress functional gains from skilled physical therapy services.  Goal status: New   3. Patient will demonstrate FOTO outcome > or = 76 % to indicate reduced disability due to condition.  Goal status: New   4. Pt will improve bilateral trunk rotation to WNL for improved functional mobility.   Goal status: New   5.  Pt will be able to report sitting at her desk for 1 hour with pain </= 2/10 in her low back.    Goal status: New   6.  Pt will be able to walk for >/= 20 minutes with no pain noted in her low back.   Goal status: New     PLAN:  PT FREQUENCY: 1-2x/week  PT DURATION: 10 weeks  PLANNED INTERVENTIONS: Therapeutic exercises, Therapeutic activity, Neuro Muscular re-education, Balance training, Gait training, Patient/Family education, Joint mobilization, Stair training, DME instructions, Dry Needling, Electrical stimulation, Cryotherapy, vasopneumatic device, Moist heat, Taping, Traction Ultrasound, Ionotophoresis 4mg /ml Dexamethasone, and aquatic therapy, Manual therapy.  All included unless contraindicated  PLAN FOR NEXT SESSION:  manual as needed, core strengthening, DN as needed.  Assess response to DN          Sharmon Leyden, PT, MPT 03/20/2023, 9:01 AM

## 2023-03-27 ENCOUNTER — Encounter: Payer: Self-pay | Admitting: Physical Therapy

## 2023-03-27 ENCOUNTER — Ambulatory Visit: Payer: 59 | Admitting: Physical Therapy

## 2023-03-27 DIAGNOSIS — M5459 Other low back pain: Secondary | ICD-10-CM | POA: Diagnosis not present

## 2023-03-27 DIAGNOSIS — R293 Abnormal posture: Secondary | ICD-10-CM | POA: Diagnosis not present

## 2023-03-27 DIAGNOSIS — R262 Difficulty in walking, not elsewhere classified: Secondary | ICD-10-CM

## 2023-03-27 DIAGNOSIS — M6281 Muscle weakness (generalized): Secondary | ICD-10-CM

## 2023-03-27 NOTE — Therapy (Signed)
OUTPATIENT PHYSICAL THERAPY THORACOLUMBAR  TREATMENT NOTE   Patient Name: Carol Parks MRN: [id number] DOB:1971-06-13, 52 y.o., female Today's Date: 03/27/2023  END OF SESSION:  PT End of Session - 03/27/23 0838     Visit Number 8    Number of Visits 20    Date for PT Re-Evaluation 04/07/23    PT Start Time 0802    PT Stop Time 0842    PT Time Calculation (min) 40 min    Activity Tolerance Patient tolerated treatment well    Behavior During Therapy WFL for tasks assessed/performed               Past Medical History:  Diagnosis Date   GERD (gastroesophageal reflux disease)    H/O gestational diabetes mellitus, not currently pregnant    H/O iron deficiency anemia    due to donating blood   Seasonal allergies    Sleep apnea    uses CPAP   Past Surgical History:  Procedure Laterality Date   REDUCTION MAMMAPLASTY Bilateral 1991   TUBAL LIGATION Bilateral 09/05/2013   Procedure: POST PARTUM TUBAL LIGATION;  Surgeon: Esmeralda Arthur, MD;  Location: [facility];  Service: Gynecology;  Laterality: Bilateral;   WISDOM TOOTH EXTRACTION     Patient Active Problem List   Diagnosis Date Noted   Acute non-recurrent pansinusitis 01/31/2022   Nasal congestion 01/31/2022   OSA on CPAP 05/31/2021   Hiatal hernia with GERD 05/13/2017   Latex allergy 09/03/2013   Hx of reduction mammoplasty 09/03/2013    PCP: Sharon Seller, NP )  REFERRING PROVIDER: Sharon Seller, NP   REFERRING DIAG: M54.50,G89.29 (ICD-10-CM) - Chronic bilateral low back pain without sciatica  Rationale for Evaluation and Treatment: Rehabilitation  THERAPY DIAG:  Other low back pain  Difficulty in walking, not elsewhere classified  Abnormal posture  Muscle weakness (generalized)  ONSET DATE: about a month ago                                                                                                                                                                                           SUBJECTIVE:  SUBJECTIVE STATEMENT: Pt still reporting 2/10 pain in her left low back.   PERTINENT HISTORY:  GERD, iron deficiency anemia, sleep apnea  PAIN:  NPRS scale:2/10 Pain location: Rt glutes Pain description: achy Aggravating factors: bending, prolonged sitting Relieving factors: heating pad, aleve  PRECAUTIONS: None  WEIGHT BEARING RESTRICTIONS: No  FALLS:  Has patient fallen in last 6 months? 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  PLOF: Independent  PATIENT GOALS: Lose Weight, work without pain  Next MD Visit:    OBJECTIVE:   DIAGNOSTIC FINDINGS:  01/25/23: no images found  PATIENT SURVEYS:  01/25/23: FOTO eval:  58  predicted:  76 03/13/23: FOTO:  66%  SCREENING FOR RED FLAGS: Bowel or bladder incontinence: No Cauda equina syndrome: No  COGNITION: Overall cognitive status: WFL normal      SENSATION: 01/25/23: WFL  MUSCLE LENGTH: Hamstrings: Right 55 deg; Left 60 deg   POSTURE: rounded shoulders, forward head, and increased lumbar lordosis  PALPATION: 01/25/23: TTP, bilateral lumbar paraspinals, bilateral QL, bilateral piriformis  LUMBAR ROM:   AROM 01/25/23 02/13/23  Flexion 65 76  Extension 22 32  Right lateral flexion 32 36  Left lateral flexion 25 34  Right rotation Limited 50% Limited 25%  Left rotation Limited 50% WFL   (Blank rows = not tested)  LOWER EXTREMITY ROM:       Right 01/25/23 Left 01/25/23 Rt : Left 03/13/23  Hip flexion A: 102 A: 100 A: 105 : 102 P: 110 : 115  Hip extension     Hip abduction     Hip adduction     Hip internal  rotation     Hip external rotation     Knee flexion     Knee extension     Ankle dorsiflexion     Ankle plantarflexion     Ankle inversion     Ankle eversion      (Blank rows = not tested)  LOWER EXTREMITY MMT:    MMT Right 01/25/23 Left 01/25/23 03/27/23 Left  Hip flexion 5 4 4+  Hip extension     Hip abduction 5 5   Hip adduction 5 5   Hip internal rotation     Hip external rotation     Knee flexion 5 5   Knee extension 5 5   Ankle dorsiflexion     Ankle plantarflexion     Ankle inversion     Ankle eversion      (Blank rows = not tested)  LUMBAR SPECIAL TESTS:  01/25/23: Slump test: Negative  FUNCTIONAL TESTS:   01/25/23: 5 times sit to stand: 13 seconds no UE support  GAIT: Distance walked: 30 feet  Assistive device utilized: None Level of assistance: Complete Independence Comments: anterior pelvic tilt, wide BOS ____________________________________________________________  TODAY'S TREATMENT:  DATE:  03/27/23:  TherEx:  Nustep: Level 5 x 8 minutes UE/LE Leg Press: 75# bil LE 3 x 10 Standing hip extension: 2 x 10 bil  Seated trunk flexion arm on green physioball: x 10 slow eccentrics Seated trunk rotation arms on green pysioball x 5  Seated trunk rotation using 2# bar x 5 Supine bridge clam shell red TB x 10  Supine dead bug: x 10  Supine marching using 2# bar reaching to reach knee x 10   03/20/23:  TherEx:  Nustep: Level 3 x 6 minutes UE/LE QL door stretch: x 4 holding 20 sec Supine piriformis stretch: x 2 on each LE x 30 sec Supine glute stretch pushing knee away in figure 4 x 2 each LE x 30 sec Seated lumbar forward flexion using green physio-ball x 10 holidng 10 sec Seated QL stretch using green physio-ball x 5 each side holding 5-10 sec Standing trunk extension elbows on the wall x 10 holding 10 sec Manual Trigger Point  Dry-Needling  Treatment instructions: Expect mild to moderate muscle soreness. S/S of pneumothorax if dry needled over a lung field, and to seek immediate medical attention should they occur. Patient verbalized understanding of these instructions and education.  Patient Consent Given: Yes Education handout provided: Previously provided Muscles treated: Rt glute max, glut med, piriformis Electrical stimulation performed: No Parameters: N/A Treatment response/outcome: twitch response noted, pt reporting muscles feel less tense      03/13/23:  TherEx:  Nustep: Level 3 x 6 minutes UE/LE QL door stretch: x 4 holding 20 sec Hip hiking: x 10 each LE Mini squats with red physio-ball in lumbar curvature for posture support x 15  Supine bridges c ball squeeze x 10 holding 5 sec Cat/Cow: x 10 holding 10 seconds Prone: hip extension: 2 x 10 bil LE BATCA: 20# 2 x 10 holding 3 sec Leg Press: 87# bil LE's x 15, single LE: 43#  x 10   Measurements performed and FOTO repeated     02/27/23:  TherEx:  Nustep: Level 3 x 6 minutes UE/LE Supine: trunk rotation: x 3 holding 20 seconds bil Supine thoracic rotation x 3 holding 30 sec Supine bridges: x 10 holding 10 sec Supine bridges c clam shells  2 x 10  BATCA: 20# 2 x 10 holding 3 sec Leg Press: 75# bil LE's x 15, single LE: 37#  x 10      PATIENT EDUCATION:  Education details: HEP, POC Person educated: Patient Education method: Programmer, multimedia, Demonstration, Verbal cues, and Handouts Education comprehension: verbalized understanding, returned demonstration, and verbal cues required  HOME EXERCISE PROGRAM: Access Code: [email or web address] URL: [email or web address] Date: 01/25/2023 Prepared by: Narda Amber  Exercises - Supine Posterior Pelvic Tilt  - 2 x daily - 7 x weekly - 2 sets - 10 reps - 5 seconds hold - Bridge  - 2 x daily - 7 x weekly - 2 sets - 10 reps - 5 seconds hold - Supine Figure 4 Piriformis Stretch  - 2 x daily -  7 x weekly - 3 reps - 30 seconds hold - Supine Piriformis Stretch with Foot on Ground  - 2 x daily - 7 x weekly - 3 reps - 30 seconds hold - Seated Hamstring Stretch  - 2 x daily - 7 x weekly - 3 reps - 30 seconds hold  ASSESSMENT:  CLINICAL IMPRESSION: Pt still reporting 2/10 in her low back. Pt reporting more pin point soreness with pressure/touch. Pt stating her ball chair seems  to  be working. Pt tolerating core exercises well. Continue skilled PT to maximize pt's function.     Recommend continue skilled PT interventions to maximize pt's function.     OBJECTIVE IMPAIRMENTS: decreased activity tolerance, decreased balance, difficulty walking, decreased ROM, decreased strength, obesity, and pain.   ACTIVITY LIMITATIONS: lifting, bending, sitting, standing, squatting, and transfers  PARTICIPATION LIMITATIONS: cleaning, driving, community activity, and occupation  PERSONAL FACTORS: 3+ comorbidities: see pertinent history above  are also affecting patient's functional outcome.   REHAB POTENTIAL: Good  CLINICAL DECISION MAKING: Stable/uncomplicated  EVALUATION COMPLEXITY: Low   GOALS: Goals reviewed with patient? Yes  SHORT TERM GOALS: (target date for Short term goals are 3 weeks 02/17/23)  1. Patient will demonstrate independent use of home exercise program to maintain progress from in clinic treatments.  Goal status: MET 02/13/23  LONG TERM GOALS: (target dates for all long term goals are 10 weeks  04/07/23 )   1. Patient will demonstrate/report pain at worst less than or equal to 2/10 to facilitate minimal limitation in daily activity secondary to pain symptoms.  Goal status: On-going 03/27/23   2. Patient will demonstrate independent use of home exercise program to facilitate ability to maintain/progress functional gains from skilled physical therapy services.  Goal status: On-going 03/27/23   3. Patient will demonstrate FOTO outcome > or = 76 % to indicate reduced  disability due to condition.  Goal status: On-going 03/27/23   4. Pt will improve bilateral trunk rotation to WNL for improved functional mobility.   Goal status: On-going 03/27/23   5.  Pt will be able to report sitting at her desk for 1 hour with pain </= 2/10 in her low back.    Goal status: MET 03/27/23   6.  Pt will be able to walk for >/= 20 minutes with no pain noted in her low back.   Goal status: on-going 03/27/23     PLAN:  PT FREQUENCY: 1-2x/week  PT DURATION: 10 weeks  PLANNED INTERVENTIONS: Therapeutic exercises, Therapeutic activity, Neuro Muscular re-education, Balance training, Gait training, Patient/Family education, Joint mobilization, Stair training, DME instructions, Dry Needling, Electrical stimulation, Cryotherapy, vasopneumatic device, Moist heat, Taping, Traction Ultrasound, Ionotophoresis 4mg /ml Dexamethasone, and aquatic therapy, Manual therapy.  All included unless contraindicated  PLAN FOR NEXT SESSION:  manual as needed, core strengthening, DN as needed.       Sharmon Leyden, PT, MPT 03/27/2023, 9:03 AM

## 2023-04-03 ENCOUNTER — Ambulatory Visit: Payer: 59 | Admitting: Physical Therapy

## 2023-04-03 ENCOUNTER — Encounter: Payer: Self-pay | Admitting: Physical Therapy

## 2023-04-03 DIAGNOSIS — R293 Abnormal posture: Secondary | ICD-10-CM

## 2023-04-03 DIAGNOSIS — R262 Difficulty in walking, not elsewhere classified: Secondary | ICD-10-CM

## 2023-04-03 DIAGNOSIS — M6281 Muscle weakness (generalized): Secondary | ICD-10-CM | POA: Diagnosis not present

## 2023-04-03 DIAGNOSIS — M5459 Other low back pain: Secondary | ICD-10-CM | POA: Diagnosis not present

## 2023-04-03 NOTE — Therapy (Signed)
OUTPATIENT PHYSICAL THERAPY THORACOLUMBAR  TREATMENT NOTE   Patient Name: Carol Parks MRN: [id number] DOB:1970/10/13, 52 y.o., female Today's Date: 04/03/2023  END OF SESSION:  PT End of Session - 04/03/23 0808     Visit Number 9    Number of Visits 20    Date for PT Re-Evaluation 04/07/23    PT Start Time 0806    PT Stop Time 0844    PT Time Calculation (min) 38 min    Activity Tolerance Patient tolerated treatment well    Behavior During Therapy WFL for tasks assessed/performed               Past Medical History:  Diagnosis Date   GERD (gastroesophageal reflux disease)    H/O gestational diabetes mellitus, not currently pregnant    H/O iron deficiency anemia    due to donating blood   Seasonal allergies    Sleep apnea    uses CPAP   Past Surgical History:  Procedure Laterality Date   REDUCTION MAMMAPLASTY Bilateral 1991   TUBAL LIGATION Bilateral 09/05/2013   Procedure: POST PARTUM TUBAL LIGATION;  Surgeon: Esmeralda Arthur, MD;  Location: [facility];  Service: Gynecology;  Laterality: Bilateral;   WISDOM TOOTH EXTRACTION     Patient Active Problem List   Diagnosis Date Noted   Acute non-recurrent pansinusitis 01/31/2022   Nasal congestion 01/31/2022   OSA on CPAP 05/31/2021   Hiatal hernia with GERD 05/13/2017   Latex allergy 09/03/2013   Hx of reduction mammoplasty 09/03/2013    PCP: Sharon Seller, NP )  REFERRING PROVIDER: Sharon Seller, NP   REFERRING DIAG: M54.50,G89.29 (ICD-10-CM) - Chronic bilateral low back pain without sciatica  Rationale for Evaluation and Treatment: Rehabilitation  THERAPY DIAG:  Other low back pain  Difficulty in walking, not elsewhere classified  Abnormal posture  Muscle weakness (generalized)  ONSET DATE: about a month ago                                                                                                                                                                                           SUBJECTIVE:  SUBJECTIVE STATEMENT: Pt reporting feeling pretty good this morning.   PERTINENT HISTORY:  GERD, iron deficiency anemia, sleep apnea  PAIN:  NPRS scale: no pain Pain location: Rt glutes Pain description: achy Aggravating factors: bending, prolonged sitting Relieving factors: heating pad, aleve  PRECAUTIONS: None  WEIGHT BEARING RESTRICTIONS: No  FALLS:  Has patient fallen in last 6 months? 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  PLOF: Independent  PATIENT GOALS: Lose Weight, work without pain  Next MD Visit:    OBJECTIVE:   DIAGNOSTIC FINDINGS:  01/25/23: no images found  PATIENT SURVEYS:  01/25/23: FOTO eval:  58  predicted:  76 03/13/23: FOTO:  66%  SCREENING FOR RED FLAGS: Bowel or bladder incontinence: No Cauda equina syndrome: No  COGNITION: Overall cognitive status: WFL normal      SENSATION: 01/25/23: WFL  MUSCLE LENGTH: Hamstrings: Right 55 deg; Left 60 deg   POSTURE: rounded shoulders, forward head, and increased lumbar lordosis  PALPATION: 01/25/23: TTP, bilateral lumbar paraspinals, bilateral QL, bilateral piriformis  LUMBAR ROM:   AROM 01/25/23 02/13/23  Flexion 65 76  Extension 22 32  Right lateral flexion 32 36  Left lateral flexion 25 34  Right rotation Limited 50% Limited 25%  Left rotation Limited 50% WFL   (Blank rows = not tested)  LOWER EXTREMITY ROM:       Right 01/25/23 Left 01/25/23 Rt : Left 03/13/23  Hip flexion A: 102 A: 100 A: 105 : 102 P: 110 : 115  Hip extension     Hip abduction     Hip adduction     Hip internal  rotation     Hip external rotation     Knee flexion     Knee extension     Ankle dorsiflexion     Ankle plantarflexion     Ankle inversion     Ankle eversion      (Blank rows = not tested)  LOWER EXTREMITY MMT:    MMT Right 01/25/23 Left 01/25/23 03/27/23 Left  Hip flexion 5 4 4+  Hip extension     Hip abduction 5 5   Hip adduction 5 5   Hip internal rotation     Hip external rotation     Knee flexion 5 5   Knee extension 5 5   Ankle dorsiflexion     Ankle plantarflexion     Ankle inversion     Ankle eversion      (Blank rows = not tested)  LUMBAR SPECIAL TESTS:  01/25/23: Slump test: Negative  FUNCTIONAL TESTS:   01/25/23: 5 times sit to stand: 13 seconds no UE support  GAIT: Distance walked: 30 feet  Assistive device utilized: None Level of assistance: Complete Independence Comments: anterior pelvic tilt, wide BOS ____________________________________________________________  TODAY'S TREATMENT:  DATE:  04/03/23:  TherEx:  Scifit; Level 3 x 6 minutes LE only TRX squats: x 10  Leg Press: 100# bil LE 2 x 10, single LE: x 10 50# Standing hip extension: 2 x 10 bil  Standing trunk extension: x 5 holding 10 sec Door stretch: x 3 at 90 deg, x 2 at 120 deg Rows: blue TB 2 x 15 holding 3 sec Dead lift 10# KB x 10 each UE 15# KB carry 100 feet c each UE, cues for core activation to maintain upright posture.     03/27/23:  TherEx:  Nustep: Level 5 x 8 minutes UE/LE Leg Press: 75# bil LE 3 x 10 Standing hip extension: 2 x 10 bil  Seated trunk flexion arm on green physioball: x 10 slow eccentrics Seated trunk rotation arms on green pysioball x 5  Seated trunk rotation using 2# bar x 5 Supine bridge clam shell red TB x 10  Supine dead bug: x 10  Supine marching using 2# bar reaching to reach knee x 10   03/20/23:  TherEx:  Nustep: Level 3 x 6  minutes UE/LE QL door stretch: x 4 holding 20 sec Supine piriformis stretch: x 2 on each LE x 30 sec Supine glute stretch pushing knee away in figure 4 x 2 each LE x 30 sec Seated lumbar forward flexion using green physio-ball x 10 holidng 10 sec Seated QL stretch using green physio-ball x 5 each side holding 5-10 sec Standing trunk extension elbows on the wall x 10 holding 10 sec Manual Trigger Point Dry-Needling  Treatment instructions: Expect mild to moderate muscle soreness. S/S of pneumothorax if dry needled over a lung field, and to seek immediate medical attention should they occur. Patient verbalized understanding of these instructions and education.  Patient Consent Given: Yes Education handout provided: Previously provided Muscles treated: Rt glute max, glut med, piriformis Electrical stimulation performed: No Parameters: N/A Treatment response/outcome: twitch response noted, pt reporting muscles feel less tense      03/13/23:  TherEx:  Nustep: Level 3 x 6 minutes UE/LE QL door stretch: x 4 holding 20 sec Hip hiking: x 10 each LE Mini squats with red physio-ball in lumbar curvature for posture support x 15  Supine bridges c ball squeeze x 10 holding 5 sec Cat/Cow: x 10 holding 10 seconds Prone: hip extension: 2 x 10 bil LE BATCA: 20# 2 x 10 holding 3 sec Leg Press: 87# bil LE's x 15, single LE: 43#  x 10   Measurements performed and FOTO repeated     PATIENT EDUCATION:  Education details: HEP, POC Person educated: Patient Education method: Programmer, multimedia, Demonstration, Verbal cues, and Handouts Education comprehension: verbalized understanding, returned demonstration, and verbal cues required  HOME EXERCISE PROGRAM: Access Code: [email or web address] URL: [email or web address] Date: 01/25/2023 Prepared by: Narda Amber  Exercises - Supine Posterior Pelvic Tilt  - 2 x daily - 7 x weekly - 2 sets - 10 reps - 5 seconds hold - Bridge  - 2 x daily - 7 x  weekly - 2 sets - 10 reps - 5 seconds hold - Supine Figure 4 Piriformis Stretch  - 2 x daily - 7 x weekly - 3 reps - 30 seconds hold - Supine Piriformis Stretch with Foot on Ground  - 2 x daily - 7 x weekly - 3 reps - 30 seconds hold - Seated Hamstring Stretch  - 2 x daily - 7 x weekly - 3 reps - 30 seconds hold  ASSESSMENT:  CLINICAL IMPRESSION: Pt arriving today reporting no pain over the last few days. Pt tolerating all exercises well for core strengthening and functional mobility. We discussed upcoming discharge pending pt's continued decline in low back pain. Continue skilled PT to maximize pt's function.         OBJECTIVE IMPAIRMENTS: decreased activity tolerance, decreased balance, difficulty walking, decreased ROM, decreased strength, obesity, and pain.   ACTIVITY LIMITATIONS: lifting, bending, sitting, standing, squatting, and transfers  PARTICIPATION LIMITATIONS: cleaning, driving, community activity, and occupation  PERSONAL FACTORS: 3+ comorbidities: see pertinent history above  are also affecting patient's functional outcome.   REHAB POTENTIAL: Good  CLINICAL DECISION MAKING: Stable/uncomplicated  EVALUATION COMPLEXITY: Low   GOALS: Goals reviewed with patient? Yes  SHORT TERM GOALS: (target date for Short term goals are 3 weeks 02/17/23)  1. Patient will demonstrate independent use of home exercise program to maintain progress from in clinic treatments.  Goal status: MET 02/13/23  LONG TERM GOALS: (target dates for all long term goals are 10 weeks  04/07/23 )   1. Patient will demonstrate/report pain at worst less than or equal to 2/10 to facilitate minimal limitation in daily activity secondary to pain symptoms.  Goal status: On-going 03/27/23   2. Patient will demonstrate independent use of home exercise program to facilitate ability to maintain/progress functional gains from skilled physical therapy services.  Goal status: On-going 03/27/23   3. Patient  will demonstrate FOTO outcome > or = 76 % to indicate reduced disability due to condition.  Goal status: On-going 03/27/23   4. Pt will improve bilateral trunk rotation to WNL for improved functional mobility.   Goal status: On-going 03/27/23   5.  Pt will be able to report sitting at her desk for 1 hour with pain </= 2/10 in her low back.    Goal status: MET 03/27/23   6.  Pt will be able to walk for >/= 20 minutes with no pain noted in her low back.   Goal status: on-going 03/27/23     PLAN:  PT FREQUENCY: 1-2x/week  PT DURATION: 10 weeks  PLANNED INTERVENTIONS: Therapeutic exercises, Therapeutic activity, Neuro Muscular re-education, Balance training, Gait training, Patient/Family education, Joint mobilization, Stair training, DME instructions, Dry Needling, Electrical stimulation, Cryotherapy, vasopneumatic device, Moist heat, Taping, Traction Ultrasound, Ionotophoresis 4mg /ml Dexamethasone, and aquatic therapy, Manual therapy.  All included unless contraindicated  PLAN FOR NEXT SESSION:   core strengthening, DN and manual therapy as needed.       Sharmon Leyden, PT, MPT 04/03/2023, 8:46 AM

## 2023-04-17 ENCOUNTER — Encounter: Payer: 59 | Admitting: Physical Therapy

## 2023-04-24 ENCOUNTER — Ambulatory Visit: Payer: 59 | Admitting: Physical Therapy

## 2023-04-24 ENCOUNTER — Encounter: Payer: Self-pay | Admitting: Physical Therapy

## 2023-04-24 DIAGNOSIS — R293 Abnormal posture: Secondary | ICD-10-CM | POA: Diagnosis not present

## 2023-04-24 DIAGNOSIS — M6281 Muscle weakness (generalized): Secondary | ICD-10-CM | POA: Diagnosis not present

## 2023-04-24 DIAGNOSIS — M5459 Other low back pain: Secondary | ICD-10-CM

## 2023-04-24 DIAGNOSIS — R262 Difficulty in walking, not elsewhere classified: Secondary | ICD-10-CM

## 2023-04-24 NOTE — Therapy (Signed)
OUTPATIENT PHYSICAL THERAPY THORACOLUMBAR  TREATMENT NOTE   Patient Name: Carol Parks MRN: [id number] DOB:September 14, 1970, 52 y.o., female Today's Date: 04/24/2023  END OF SESSION:  PT End of Session - 04/24/23 0832     Visit Number 10    Number of Visits 20    Date for PT Re-Evaluation 04/07/23    PT Start Time 0830    PT Stop Time 0910    PT Time Calculation (min) 40 min    Activity Tolerance Patient tolerated treatment well    Behavior During Therapy WFL for tasks assessed/performed               Past Medical History:  Diagnosis Date   GERD (gastroesophageal reflux disease)    H/O gestational diabetes mellitus, not currently pregnant    H/O iron deficiency anemia    due to donating blood   Seasonal allergies    Sleep apnea    uses CPAP   Past Surgical History:  Procedure Laterality Date   REDUCTION MAMMAPLASTY Bilateral 1991   TUBAL LIGATION Bilateral 09/05/2013   Procedure: POST PARTUM TUBAL LIGATION;  Surgeon: Esmeralda Arthur, MD;  Location: [facility];  Service: Gynecology;  Laterality: Bilateral;   WISDOM TOOTH EXTRACTION     Patient Active Problem List   Diagnosis Date Noted   Acute non-recurrent pansinusitis 01/31/2022   Nasal congestion 01/31/2022   OSA on CPAP 05/31/2021   Hiatal hernia with GERD 05/13/2017   Latex allergy 09/03/2013   Hx of reduction mammoplasty 09/03/2013    PCP: Sharon Seller, NP   REFERRING PROVIDER: Sharon Seller, NP   REFERRING DIAG: M54.50,G89.29 (ICD-10-CM) - Chronic bilateral low back pain without sciatica  Rationale for Evaluation and Treatment: Rehabilitation  THERAPY DIAG:  Other low back pain  Difficulty in walking, not elsewhere classified  Abnormal posture  Muscle weakness (generalized)  ONSET DATE: about a month ago                                                                                                                                                                                           SUBJECTIVE:  SUBJECTIVE STATEMENT: Pt reporting fall last week playing pickle ball where she fell on her left hip and rolled over onto her Rt. Pt reporting soreness for a few days following.   PERTINENT HISTORY:  GERD, iron deficiency anemia, sleep apnea  PAIN:  NPRS scale: no pain at present Pain location: Rt glutes Pain description: achy Aggravating factors: bending, prolonged sitting Relieving factors: heating pad, aleve  PRECAUTIONS: None  WEIGHT BEARING RESTRICTIONS: No  FALLS:  Has patient fallen in last 6 months? 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  PLOF: Independent  PATIENT GOALS: Lose Weight, work without pain  Next MD Visit:    OBJECTIVE:   DIAGNOSTIC FINDINGS:  01/25/23: no images found  PATIENT SURVEYS:  01/25/23: FOTO eval:  58  predicted:  76 03/13/23: FOTO:  66%  SCREENING FOR RED FLAGS: Bowel or bladder incontinence: No Cauda equina syndrome: No  COGNITION: Overall cognitive status: WFL normal      SENSATION: 01/25/23: WFL  MUSCLE LENGTH: Hamstrings: Right 55 deg; Left 60 deg   POSTURE: rounded shoulders, forward head, and increased lumbar lordosis  PALPATION: 01/25/23: TTP, bilateral lumbar paraspinals, bilateral QL, bilateral piriformis  LUMBAR ROM:   AROM 01/25/23 02/13/23  Flexion 65 76  Extension 22 32  Right lateral flexion 32 36  Left lateral flexion 25 34  Right rotation Limited 50% Limited 25%  Left rotation Limited 50% WFL   (Blank rows = not tested)  LOWER EXTREMITY ROM:       Right 01/25/23 Left 01/25/23 Rt : Left 03/13/23  Hip  flexion A: 102 A: 100 A: 105 : 102 P: 110 : 115  Hip extension     Hip abduction     Hip adduction     Hip internal rotation     Hip external rotation     Knee flexion     Knee extension     Ankle dorsiflexion     Ankle plantarflexion     Ankle inversion     Ankle eversion      (Blank rows = not tested)  LOWER EXTREMITY MMT:    MMT Right 01/25/23 Left 01/25/23 03/27/23 Left 04/24/23 left  Hip flexion 5 4 4+ 5  Hip extension      Hip abduction 5 5  5   Hip adduction 5 5  5   Hip internal rotation      Hip external rotation      Knee flexion 5 5  5   Knee extension 5 5  5   Ankle dorsiflexion      Ankle plantarflexion      Ankle inversion      Ankle eversion       (Blank rows = not tested)  LUMBAR SPECIAL TESTS:  01/25/23: Slump test: Negative  FUNCTIONAL TESTS:   01/25/23: 5 times sit to stand: 13 seconds no UE support  GAIT: Distance walked: 30 feet  Assistive device utilized: None Level of assistance: Complete Independence Comments: anterior pelvic tilt, wide BOS ____________________________________________________________  TODAY'S TREATMENT:  DATE:   04/24/23:  TherEx:  Scifit; Level 3 x 6 minutes LE/UE  TRX squats: x 15 Leg Press: 100# bil LE x 25, single LE: x 15 c 50# Standing bil shoulder flexion x 15 c 3# Rows: blue TB x 20 holding 3 sec Standing trunk extension: x 5 holding 10 sec Wall push ups x 12    04/03/23:  TherEx:  Scifit; Level 3 x 6 minutes LE only TRX squats: x 10  Leg Press: 100# bil LE 2 x 10, single LE: x 10 50# Standing hip extension: 2 x 10 bil  Standing trunk extension: x 5 holding 10 sec Door stretch: x 3 at 90 deg, x 2 at 120 deg Rows: blue TB 2 x 15 holding 3 sec Dead lift 10# KB x 10 each UE 15# KB carry 100 feet c each UE, cues for core activation to maintain upright posture.     03/27/23:  TherEx:   Nustep: Level 5 x 8 minutes UE/LE Leg Press: 75# bil LE 3 x 10 Standing hip extension: 2 x 10 bil  Seated trunk flexion arm on green physioball: x 10 slow eccentrics Seated trunk rotation arms on green pysioball x 5  Seated trunk rotation using 2# bar x 5 Supine bridge clam shell red TB x 10  Supine dead bug: x 10  Supine marching using 2# bar reaching to reach knee x 10        PATIENT EDUCATION:  Education details: HEP, POC Person educated: Patient Education method: Programmer, multimedia, Demonstration, Verbal cues, and Handouts Education comprehension: verbalized understanding, returned demonstration, and verbal cues required  HOME EXERCISE PROGRAM: Access Code: [email or web address] URL: [email or web address] Date: 04/24/2023 Prepared by: Narda Amber  Exercises - Supine Posterior Pelvic Tilt  - 1-2 x daily - 7 x weekly - 2 sets - 10 reps - 5 seconds hold - Bridge  - 1-2 x daily - 7 x weekly - 2 sets - 10 reps - 5 seconds hold - Supine Figure 4 Piriformis Stretch  - 1-2 x daily - 7 x weekly - 3 reps - 30 seconds hold - Supine Piriformis Stretch with Foot on Ground  - 1-2 x daily - 7 x weekly - 3 reps - 30 seconds hold - Seated Hamstring Stretch  - 1-2 x daily - 7 x weekly - 3 reps - 30 seconds hold - Standing Thoracic Extension at Wall  - 1-2 x daily - 7 x weekly - 5 reps - 10 seconds hold - Standing Row with Anchored Resistance  - 1-2 x daily - 7 x weekly - 2 sets - 10 reps - 3 seconds hold - Quadruped Cat Cow  - 1-2 x daily - 7 x weekly - 10 reps - 5 seconds hold - Bird Dog  - 1-2 x daily - 7 x weekly - 3 sets - 10 reps  ASSESSMENT:  CLINICAL IMPRESSION: Pt arriving today reporting fall last week while playing pickle ball with her kids. Pt reporting soreness for 2-3 days following. Pt even reported missing work the next day. Pt stating when she fell she rolled over and reported pain in bil hips. Pt with no pain at present. Pt tolerating all exercises well today. Pt's HEP was  updated. We discussed possible upcoming discharge pending pt's pain levels. Continue skilled PT to maximize pt's function.         OBJECTIVE IMPAIRMENTS: decreased activity tolerance, decreased balance, difficulty walking, decreased ROM, decreased strength, obesity, and pain.   ACTIVITY LIMITATIONS: lifting,  bending, sitting, standing, squatting, and transfers  PARTICIPATION LIMITATIONS: cleaning, driving, community activity, and occupation  PERSONAL FACTORS: 3+ comorbidities: see pertinent history above  are also affecting patient's functional outcome.   REHAB POTENTIAL: Good  CLINICAL DECISION MAKING: Stable/uncomplicated  EVALUATION COMPLEXITY: Low   GOALS: Goals reviewed with patient? Yes  SHORT TERM GOALS: (target date for Short term goals are 3 weeks 02/17/23)  1. Patient will demonstrate independent use of home exercise program to maintain progress from in clinic treatments.  Goal status: MET 02/13/23  LONG TERM GOALS: (target dates for all long term goals are 10 weeks  04/07/23 )   1. Patient will demonstrate/report pain at worst less than or equal to 2/10 to facilitate minimal limitation in daily activity secondary to pain symptoms.  Goal status: On-going 03/27/23   2. Patient will demonstrate independent use of home exercise program to facilitate ability to maintain/progress functional gains from skilled physical therapy services.  Goal status: On-going 03/27/23   3. Patient will demonstrate FOTO outcome > or = 76 % to indicate reduced disability due to condition.  Goal status: On-going 03/27/23   4. Pt will improve bilateral trunk rotation to WNL for improved functional mobility.   Goal status: On-going 03/27/23   5.  Pt will be able to report sitting at her desk for 1 hour with pain </= 2/10 in her low back.    Goal status: MET 03/27/23   6.  Pt will be able to walk for >/= 20 minutes with no pain noted in her low back.   Goal status: on-going 03/27/23      PLAN:  PT FREQUENCY: 1-2x/week  PT DURATION: 10 weeks  PLANNED INTERVENTIONS: Therapeutic exercises, Therapeutic activity, Neuro Muscular re-education, Balance training, Gait training, Patient/Family education, Joint mobilization, Stair training, DME instructions, Dry Needling, Electrical stimulation, Cryotherapy, vasopneumatic device, Moist heat, Taping, Traction Ultrasound, Ionotophoresis 4mg /ml Dexamethasone, and aquatic therapy, Manual therapy.  All included unless contraindicated  PLAN FOR NEXT SESSION:   core strengthening, DN and manual therapy as needed,  Prepare for discharge in the next few visits if pain continues to stay down.     Sharmon Leyden, PT, MPT 04/24/2023, 9:18 AM

## 2023-05-02 ENCOUNTER — Encounter: Payer: Self-pay | Admitting: Physical Therapy

## 2023-05-02 ENCOUNTER — Ambulatory Visit: Payer: 59 | Admitting: Physical Therapy

## 2023-05-02 DIAGNOSIS — R293 Abnormal posture: Secondary | ICD-10-CM

## 2023-05-02 DIAGNOSIS — R262 Difficulty in walking, not elsewhere classified: Secondary | ICD-10-CM | POA: Diagnosis not present

## 2023-05-02 DIAGNOSIS — M6281 Muscle weakness (generalized): Secondary | ICD-10-CM | POA: Diagnosis not present

## 2023-05-02 DIAGNOSIS — M5459 Other low back pain: Secondary | ICD-10-CM

## 2023-05-02 NOTE — Therapy (Signed)
OUTPATIENT PHYSICAL THERAPY THORACOLUMBAR  Discharge   Patient Name: Carol Parks MRN: [id number] DOB:[birth year], 52 y.o., female Today's Date: 05/02/2023  END OF SESSION:  PT End of Session - 05/02/23 0854     Visit Number 11    Number of Visits 20    Date for PT Re-Evaluation 04/07/23    PT Start Time 0848    PT Stop Time 0928    PT Time Calculation (min) 40 min    Activity Tolerance Patient tolerated treatment well    Behavior During Therapy WFL for tasks assessed/performed               Past Medical History:  Diagnosis Date   GERD (gastroesophageal reflux disease)    H/O gestational diabetes mellitus, not currently pregnant    H/O iron deficiency anemia    due to donating blood   Seasonal allergies    Sleep apnea    uses CPAP   Past Surgical History:  Procedure Laterality Date   REDUCTION MAMMAPLASTY Bilateral 1991   TUBAL LIGATION Bilateral 09/05/2013   Procedure: POST PARTUM TUBAL LIGATION;  Surgeon: Esmeralda Arthur, MD;  Location: [facility];  Service: Gynecology;  Laterality: Bilateral;   WISDOM TOOTH EXTRACTION     Patient Active Problem List   Diagnosis Date Noted   Acute non-recurrent pansinusitis 01/31/2022   Nasal congestion 01/31/2022   OSA on CPAP 05/31/2021   Hiatal hernia with GERD 05/13/2017   Latex allergy 09/03/2013   Hx of reduction mammoplasty 09/03/2013    PCP: Sharon Seller, NP   REFERRING PROVIDER: Sharon Seller, NP   REFERRING DIAG: M54.50,G89.29 (ICD-10-CM) - Chronic bilateral low back pain without sciatica  Rationale for Evaluation and Treatment: Rehabilitation  THERAPY DIAG:  Other low back pain  Difficulty in walking, not elsewhere classified  Abnormal posture  Muscle weakness (generalized)  ONSET DATE: about a month ago                                                                                                                                                                                           SUBJECTIVE:  SUBJECTIVE STATEMENT: Pt reporting no pain today.   PERTINENT HISTORY:  GERD, iron deficiency anemia, sleep apnea  PAIN:  NPRS scale: no pain at present Pain location: Rt glutes Pain description: achy Aggravating factors: bending, prolonged sitting Relieving factors: heating pad, aleve  PRECAUTIONS: None  WEIGHT BEARING RESTRICTIONS: No  FALLS:  Has patient fallen in last 6 months? 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  PLOF: Independent  PATIENT GOALS: Lose Weight, work without pain  Next MD Visit:    OBJECTIVE:   DIAGNOSTIC FINDINGS:  01/25/23: no images found  PATIENT SURVEYS:  01/25/23: FOTO eval:  58  predicted:  76 03/13/23: FOTO:  66% 05/02/23: FOTO 94%  SCREENING FOR RED FLAGS: Bowel or bladder incontinence: No Cauda equina syndrome: No  COGNITION: Overall cognitive status: WFL normal      SENSATION: 01/25/23: WFL  MUSCLE LENGTH: Hamstrings: Right 55 deg; Left 60 deg   POSTURE: rounded shoulders, forward head, and increased lumbar lordosis  PALPATION: 01/25/23: TTP, bilateral lumbar paraspinals, bilateral QL, bilateral piriformis  LUMBAR ROM:   AROM 01/25/23 02/13/23 05/02/23  Flexion 65 76 88  Extension 22 32 36  Right lateral flexion 32 36 40  Left lateral flexion 25 34 42  Right rotation Limited 50% Limited 25% WFL  Left rotation Limited 50% WFL WFL   (Blank rows = not tested)  LOWER EXTREMITY ROM:       Right 01/25/23 Left 01/25/23 Rt : Left 03/13/23 05/02/23 Rt : Left  Hip flexion A: 102 A: 100 A: 105 : 102 P: 110 : 115 A: 118 : 115  Hip  extension      Hip abduction      Hip adduction      Hip internal rotation      Hip external rotation      Knee flexion      Knee extension      Ankle dorsiflexion      Ankle plantarflexion      Ankle inversion      Ankle eversion       (Blank rows = not tested)  LOWER EXTREMITY MMT:    MMT Right 01/25/23 Left 01/25/23 03/27/23 Left 04/24/23 left  Hip flexion 5 4 4+ 5  Hip extension      Hip abduction 5 5  5   Hip adduction 5 5  5   Hip internal rotation      Hip external rotation      Knee flexion 5 5  5   Knee extension 5 5  5   Ankle dorsiflexion      Ankle plantarflexion      Ankle inversion      Ankle eversion       (Blank rows = not tested)  LUMBAR SPECIAL TESTS:  01/25/23: Slump test: Negative  FUNCTIONAL TESTS:   01/25/23: 5 times sit to stand: 13 seconds no UE support 05/02/23: 5 time sit to stand: 8/8 seconds no UE support  GAIT: Distance walked: 30 feet  Assistive device utilized: None Level of assistance: Complete Independence Comments: anterior pelvic tilt, wide BOS ____________________________________________________________  TODAY'S TREATMENT:  DATE:   05/02/23:  TherEx:  Nustep: level 6 x 8 minutes Leg Press: 100# x 25, single LE 62# x 15 bil Sit to stand: x 5  Rows: 25# 2 x 15   MMT and ROM measurements performed   04/03/23:  TherEx:  Scifit; Level 3 x 6 minutes LE only TRX squats: x 10  Leg Press: 100# bil LE 2 x 10, single LE: x 10 50# Standing hip extension: 2 x 10 bil  Standing trunk extension: x 5 holding 10 sec Door stretch: x 3 at 90 deg, x 2 at 120 deg Rows: blue TB 2 x 15 holding 3 sec Dead lift 10# KB x 10 each UE 15# KB carry 100 feet c each UE, cues for core activation to maintain upright posture.      PATIENT EDUCATION:  Education details: HEP, POC Person educated: Patient Education method:  Programmer, multimedia, Demonstration, Verbal cues, and Handouts Education comprehension: verbalized understanding, returned demonstration, and verbal cues required  HOME EXERCISE PROGRAM: Access Code: [email or web address] URL: [email or web address] Date: 04/24/2023 Prepared by: Narda Amber  Exercises - Supine Posterior Pelvic Tilt  - 1-2 x daily - 7 x weekly - 2 sets - 10 reps - 5 seconds hold - Bridge  - 1-2 x daily - 7 x weekly - 2 sets - 10 reps - 5 seconds hold - Supine Figure 4 Piriformis Stretch  - 1-2 x daily - 7 x weekly - 3 reps - 30 seconds hold - Supine Piriformis Stretch with Foot on Ground  - 1-2 x daily - 7 x weekly - 3 reps - 30 seconds hold - Seated Hamstring Stretch  - 1-2 x daily - 7 x weekly - 3 reps - 30 seconds hold - Standing Thoracic Extension at Wall  - 1-2 x daily - 7 x weekly - 5 reps - 10 seconds hold - Standing Row with Anchored Resistance  - 1-2 x daily - 7 x weekly - 2 sets - 10 reps - 3 seconds hold - Quadruped Cat Cow  - 1-2 x daily - 7 x weekly - 10 reps - 5 seconds hold - Bird Dog  - 1-2 x daily - 7 x weekly - 3 sets - 10 reps  ASSESSMENT:  CLINICAL IMPRESSION: Pt has reported no pain for the last 2 weeks. Pt is currently independent in her HEP. Pt has met all her short and long term goals set at her initial evaluation. Pt is being discharged from skilled PT services.    OBJECTIVE IMPAIRMENTS: decreased activity tolerance, decreased balance, difficulty walking, decreased ROM, decreased strength, obesity, and pain.   ACTIVITY LIMITATIONS: lifting, bending, sitting, standing, squatting, and transfers  PARTICIPATION LIMITATIONS: cleaning, driving, community activity, and occupation  PERSONAL FACTORS: 3+ comorbidities: see pertinent history above  are also affecting patient's functional outcome.   REHAB POTENTIAL: Good  CLINICAL DECISION MAKING: Stable/uncomplicated  EVALUATION COMPLEXITY: Low   GOALS: Goals reviewed with patient? Yes  SHORT TERM  GOALS: (target date for Short term goals are 3 weeks 02/17/23)  1. Patient will demonstrate independent use of home exercise program to maintain progress from in clinic treatments.  Goal status: MET 02/13/23  LONG TERM GOALS: (target dates for all long term goals are 10 weeks  04/07/23 )   1. Patient will demonstrate/report pain at worst less than or equal to 2/10 to facilitate minimal limitation in daily activity secondary to pain symptoms.  Goal status: MET 04/2723   2. Patient will demonstrate independent use of home  exercise program to facilitate ability to maintain/progress functional gains from skilled physical therapy services.  Goal status: MET 04/2723   3. Patient will demonstrate FOTO outcome > or = 76 % to indicate reduced disability due to condition.  Goal status: MET 04/2723   4. Pt will improve bilateral trunk rotation to WNL for improved functional mobility.   Goal status: MET 04/2723   5.  Pt will be able to report sitting at her desk for 1 hour with pain </= 2/10 in her low back.    Goal status: MET 03/27/23   6.  Pt will be able to walk for >/= 20 minutes with no pain noted in her low back.   Goal status: MET 04/2723     PLAN:  PT FREQUENCY: 1-2x/week  PT DURATION: 10 weeks  PLANNED INTERVENTIONS: Therapeutic exercises, Therapeutic activity, Neuro Muscular re-education, Balance training, Gait training, Patient/Family education, Joint mobilization, Stair training, DME instructions, Dry Needling, Electrical stimulation, Cryotherapy, vasopneumatic device, Moist heat, Taping, Traction Ultrasound, Ionotophoresis 4mg /ml Dexamethasone, and aquatic therapy, Manual therapy.  All included unless contraindicated  PLAN FOR NEXT SESSION:   Discharge   PHYSICAL THERAPY DISCHARGE SUMMARY  Visits from Start of Care: 11  Current functional level related to goals / functional outcomes: See above   Remaining deficits: See above   Education / Equipment: HEP   Patient  agrees to discharge. Patient goals were met. Patient is being discharged due to meeting the stated rehab goals.   Sharmon Leyden, PT, MPT 05/02/2023, 9:01 AM

## 2023-05-12 ENCOUNTER — Encounter: Payer: Self-pay | Admitting: Nurse Practitioner

## 2023-05-12 NOTE — Telephone Encounter (Signed)
Correct please cancel this appt and we will get her labs with physical next year

## 2023-05-26 ENCOUNTER — Other Ambulatory Visit: Payer: 59

## 2023-06-08 NOTE — Progress Notes (Signed)
PATIENT: Carol Parks DOB: April 06, 1971  REASON FOR VISIT: follow up HISTORY FROM: patient  Virtual Visit via Telephone Note  I connected with Carol Parks on 06/13/23 at  8:00 AM EDT by telephone and verified that I am speaking with the correct person using two identifiers.   I discussed the limitations, risks, security and privacy concerns of performing an evaluation and management service by telephone and the availability of in person appointments. I also discussed with the patient that there may be a patient responsible charge related to this service. The patient expressed understanding and agreed to proceed.   History of Present Illness:  06/13/23 ALL: Carol Parks returns for follow up for OSA on CPAp. She is doing well on therapy. She uses CPAP most nights for about 5-6 hours. She does not use therapy on Fridays. She does note more snoring on nights she doesn't use her machine. She denies concerns with machine or supplies. Occasionally feels headgear may cause a headache. Using FFM.     06/03/2022 ALL:  Carol Parks returns for follow up for OSA on CPAP. She continues to do well on therapy. She does continue CPAP holiday on Fridays. She is using FFM and notes some leaking at times when on her side. She feels mask is comfortable. Apnea well managed.     06/01/2021 ALL: She returns for follow up for OSA on CPAP. She is doing fairly well with CPAP therapy. She had Covid mid September and was unable to use her machine for about a week. She continues to take a therapy holiday on Friday nights. She is using a FFM. She has some intermittent tenderness of the back of her head from the headgear. She has tried multiple masks and feels this is the best option. She has been disappointed about not being able to lose weight.     05/26/2020 ALL:  Carol Parks is a 52 y.o. female here today for follow up for OSA on CPAP.  She continues to do well with CPAP therapy.  She admits that she usually takes 1 day off  during the week.  She does note significant improvement in daytime energy and improved sleep quality.  She denies any concerns today.  Compliance report dated 04/24/2020 through 05/23/2020 reveals that she used CPAP 24 of the past 30 days for compliance of 80%.  She used CPAP greater than 4 hours 23 of the past 30 days for compliance of 77%.  Average usage was 6 hours and 9 minutes.  Residual AHI was 1.4 on 6 to 12 cm of water and an EPR of 3.  There was no significant leak noted.  History (copied from my note on 11/21/2019)  Carol Parks is a 52 y.o. female here today for follow up for OSA recently started on CPAP therapy. HST in 07/2019 revealed total AHI of 21.6/hr and O2nadir of 80%. She is doing fairly well with CPAP therapy. She has tried several masks and feels that the FFM is the best fit. She continues to have difficulty sleeping on her side but feels she is adjusting. She Does note that she feels better rested when she uses CPAP.    Compliance report dated 10/20/2019 through 11/18/2019 reveals that she used CPAP 24 of the last 30 days for compliance of 80%.  23 of the last 30 days she used CPAP greater than 4 hours for compliance of 77%.  Average usage on days used was 5 hours and 57 minutes.  Residual AHI was 2.0 on 6  to 12 cm of water and an EPR of 3.  There was no significant leak noted.     HISTORY: (copied from Dr Teofilo Pod note on 11/14/2018)   Dear Dr. Leretha Parks,   I saw your patient, Carol Parks, upon your kind request in my sleep clinic today for initial consultation of her sleep disorder, in particular, concern for underlying obstructive sleep apnea. The patient is unaccompanied today. As you know, Carol Parks is a 52 year old right-handed woman with an underlying medical history of allergies, reflux disease, nasal polyps, anemia and obesity, who reports snoring and excessive daytime somnolence. I reviewed your office note from 10/19/2018. She lives at home with her husband and 3 children,  ages 22, 41 and 64. Time is generally late, she admits that she tends to stay up late. She is in bed between 11 and 11:30, rise time is 6:30. She has a history of allergies symptoms. She grinds her teeth and has been using a bite guard for years. Her weight has been fluctuating. At one point she was able to lose weight and she followed with weight management but no longer sees a weight management doctor. Her snoring is reportedly loud and disturbing to her husband. She denies any telltale restless leg symptoms. She is a restless sleeper. She has had some morning headaches. She has migrainous headaches infrequently. No formal migraine diagnosis from before however. She denies nocturia. She is not aware of any family history of OSA. She has recently seen ENT for her nasal congestion and concern for nasal polyps. She has a sinus CT pending soon. She is a nonsmoker, does not utilize alcohol and drinks caffeine occasionally in the form of soda, not daily. She works for [employer].   Observations/Objective:  Generalized: Well developed, in no acute distress  Mentation: Alert oriented to time, place, history taking. Follows all commands speech and language fluent   Assessment and Plan:  52 y.o. year old female  has a past medical history of GERD (gastroesophageal reflux disease), H/O gestational diabetes mellitus, not currently pregnant, H/O iron deficiency anemia, Seasonal allergies, and Sleep apnea. here with    ICD-10-CM   1. OSA on CPAP  G47.33 For home use only DME continuous positive airway pressure (CPAP)      Carol Parks is doing well with CPAP therapy.  Compliance report reveals acceptable compliance. I have encouraged her to use CPAP every night for greater than 4 hours every night. I will update supply orders. She may continue to work with DME if new mask option becomes available. Healthy lifestyle habits encouraged. Consider low carb diet. She will follow-up with [place] in 1 year, sooner if  needed.  She verbalizes understanding and agreement with this plan.  Orders Placed This Encounter  Procedures   For home use only DME continuous positive airway pressure (CPAP)    Supplies    Order Specific Question:   Length of Need    Answer:   Lifetime    Order Specific Question:   Patient has OSA or probable OSA    Answer:   Yes    Order Specific Question:   Is the patient currently using CPAP in the home    Answer:   Yes    Order Specific Question:   Settings    Answer:   Other see comments    Order Specific Question:   CPAP supplies needed    Answer:   Mask, headgear, cushions, filters, heated tubing and water chamber  No orders of the defined types were placed in this encounter.     Follow Up Instructions:  I discussed the assessment and treatment plan with the patient. The patient was provided an opportunity to ask questions and all were answered. The patient agreed with the plan and demonstrated an understanding of the instructions.   The patient was advised to call back or seek an in-person evaluation if the symptoms worsen or if the condition fails to improve as anticipated.  I provided 15 minutes of non-face-to-face time during this encounter.  Patient is located at her place of residence during my chart visit.  Providers in the office.   Shawnie Dapper, NP

## 2023-06-08 NOTE — Patient Instructions (Signed)

## 2023-06-12 ENCOUNTER — Telehealth: Payer: Self-pay

## 2023-06-12 NOTE — Telephone Encounter (Signed)
Please see mychart message from today 06/12/2023

## 2023-06-13 ENCOUNTER — Encounter: Payer: Self-pay | Admitting: Family Medicine

## 2023-06-13 ENCOUNTER — Telehealth: Payer: 59 | Admitting: Family Medicine

## 2023-06-13 DIAGNOSIS — G4733 Obstructive sleep apnea (adult) (pediatric): Secondary | ICD-10-CM

## 2023-07-05 ENCOUNTER — Ambulatory Visit
Admission: EM | Admit: 2023-07-05 | Discharge: 2023-07-05 | Disposition: A | Discharge status: Home / Self Care | Payer: 59 | Attending: Physician Assistant | Admitting: Physician Assistant

## 2023-07-05 DIAGNOSIS — J069 Acute upper respiratory infection, unspecified: Secondary | ICD-10-CM | POA: Insufficient documentation

## 2023-07-05 DIAGNOSIS — Z1152 Encounter for screening for COVID-19: Secondary | ICD-10-CM | POA: Diagnosis present

## 2023-07-05 LAB — POCT RAPID STREP A (OFFICE): Rapid Strep A Screen: NEGATIVE

## 2023-07-05 LAB — SARS CORONAVIRUS 2 (TAT 6-24 HRS): SARS Coronavirus 2: NEGATIVE

## 2023-07-05 NOTE — ED Provider Notes (Signed)
[facility]    CSN: [id number] Arrival date & time: 07/05/23  0807      History   Chief Complaint Chief Complaint  Patient presents with   Sore Throat    Family of 2   Headache    HPI Carol Parks is a 52 y.o. female.   Patient here today for evaluation of sore throat, headache that started a few days ago.  She has other family members with similar symptoms.  She denies any vomiting.  She has not had any diarrhea.  She has taken over-the-counter medication without resolution.  The history is provided by the patient.    Past Medical History:  Diagnosis Date   Acute non-recurrent pansinusitis 01/31/2022   GERD (gastroesophageal reflux disease)    H/O gestational diabetes mellitus, not currently pregnant    H/O iron deficiency anemia    due to donating blood   Nasal congestion 01/31/2022   Seasonal allergies    Sleep apnea    uses CPAP    Patient Active Problem List   Diagnosis Date Noted   OSA on CPAP 05/31/2021   Hiatal hernia with GERD 05/13/2017   Latex allergy 09/03/2013   Hx of reduction mammoplasty 09/03/2013    Past Surgical History:  Procedure Laterality Date   REDUCTION MAMMAPLASTY Bilateral 1991   TUBAL LIGATION Bilateral 09/05/2013   Procedure: POST PARTUM TUBAL LIGATION;  Surgeon: Esmeralda Arthur, MD;  Location: [facility];  Service: Gynecology;  Laterality: Bilateral;   WISDOM TOOTH EXTRACTION      OB History     Gravida  3   Para  3   Term  3   Preterm  0   AB  0   Living  3      SAB  0   IAB  0   Ectopic  0   Multiple  0   Live Births  1            Home Medications    Prior to Admission medications   Medication Sig Start Date End Date Taking? Authorizing Provider  fluticasone (FLONASE) 50 MCG/ACT nasal spray Place 2 sprays into both nostrils daily. 06/29/16  Yes Porfirio Oar, PA  Omeprazole 20 MG TBEC Take 1 tablet (20 mg total) by mouth daily before breakfast. For six weeks 11/21/16  Yes Myra Rude, MD  cetirizine (ZYRTEC) 10 MG chewable tablet Chew 10 mg by mouth daily as needed.    [provider]  Cholecalciferol (VITAMIN D3 PO) Take 1 tablet by mouth daily.    [provider]  Multiple Vitamin (MULTIVITAMIN PO) Take 1 tablet by mouth daily.    [provider]  Multiple Vitamins-Iron (MULTIVITAMIN PLUS IRON ADULT PO) Take 1 tablet by mouth 2 (two) times a week.    [provider]  Omega-3 Fatty Acids (FISH OIL PO) Take 1 capsule by mouth 2 (two) times a week.    [provider]  Red Yeast Rice Extract (RED YEAST RICE PO) Take 1 capsule by mouth 2 (two) times a week.    [provider]    Family History Family History  Problem Relation Age of Onset   Cancer Mother 68       breast cancer   Hypertension Mother    Early death Father    Heart disease Father    Heart attack Father    Aneurysm Father    Stroke Father    Hyperlipidemia Brother    Asthma  Daughter    Mental retardation Paternal Aunt    Heart attack Maternal Grandmother    Heart attack Paternal Grandmother    Heart attack Paternal Grandfather    Colon polyps Neg Hx    Colon cancer Neg Hx    Esophageal cancer Neg Hx    Rectal cancer Neg Hx    Stomach cancer Neg Hx     Social History Social History   Tobacco Use   Smoking status: Never    Passive exposure: Never   Smokeless tobacco: Never  Vaping Use   Vaping status: Never Used  Substance Use Topics   Alcohol use: No    Alcohol/week: 0.0 standard drinks of alcohol   Drug use: No     Allergies   Penicillins and Latex   Review of Systems Review of Systems  Constitutional:  Negative for chills and fever.  HENT:  Positive for congestion and sore throat. Negative for ear pain.   Eyes:  Negative for discharge and redness.  Respiratory:  Positive for cough. Negative for shortness of breath and wheezing.   Gastrointestinal:  Negative for abdominal pain, diarrhea, nausea and vomiting.      Physical Exam Triage Vital Signs ED Triage Vitals  Encounter Vitals Group     BP      Systolic BP Percentile      Diastolic BP Percentile      Pulse      Resp      Temp      Temp src      SpO2      Weight      Height      Head Circumference      Peak Flow      Pain Score      Pain Loc      Pain Education      Exclude from Growth Chart    No data found.  Updated Vital Signs BP 136/86 (BP Location: Left Arm)   Pulse 89   Temp 98.3 F (36.8 C) (Oral)   Resp 18   Ht 5\' 4"  (1.626 m)   Wt 268 lb (121.6 kg)   LMP 06/12/2023 (Approximate)   SpO2 98%   BMI 46.00 kg/m      Physical Exam Vitals and nursing note reviewed.  Constitutional:      General: She is not in acute distress.    Appearance: Normal appearance. She is not ill-appearing.  HENT:     Head: Normocephalic and atraumatic.     Right Ear: Tympanic membrane normal.     Left Ear: Tympanic membrane normal.     Nose: Congestion present.     Mouth/Throat:     Mouth: Mucous membranes are moist.     Pharynx: No oropharyngeal exudate or posterior oropharyngeal erythema.  Eyes:     Conjunctiva/sclera: Conjunctivae normal.  Cardiovascular:     Rate and Rhythm: Normal rate and regular rhythm.     Heart sounds: Normal heart sounds. No murmur heard. Pulmonary:     Effort: Pulmonary effort is normal. No respiratory distress.     Breath sounds: Normal breath sounds. No wheezing, rhonchi or rales.  Skin:    General: Skin is warm and dry.  Neurological:     Mental Status: She is alert.  Psychiatric:        Mood and Affect: Mood normal.        Thought Content: Thought content normal.      UC Treatments / Results  Labs (  all labs ordered are listed, but only abnormal results are displayed) Labs Reviewed  POCT RAPID STREP A (OFFICE) - Normal  SARS CORONAVIRUS 2 (TAT 6-24 HRS)    EKG   Radiology No results found.  Procedures Procedures (including critical care time)  Medications Ordered in  UC Medications - No data to display  Initial Impression / Assessment and Plan / UC Course  I have reviewed the triage vital signs and the nursing notes.  Pertinent labs & imaging results that were available during my care of the patient were reviewed by me and considered in my medical decision making (see chart for details).    Suspect viral etiology of symptoms.  Will screen for COVID as well as strep.  Rapid strep test negative in office.  Encouraged symptomatic treatment, increase fluids and rest and advised follow-up with any further concerns or worsening symptoms.  Final Clinical Impressions(s) / UC Diagnoses   Final diagnoses:  Encounter for screening for COVID-19  Acute upper respiratory infection   Discharge Instructions   None    ED Prescriptions   None    PDMP not reviewed this encounter.   Tomi Bamberger, PA-C 07/19/23 1909

## 2023-07-05 NOTE — ED Triage Notes (Signed)
"  On Saturday woke up not feeling well, through up until Tuesday morning with had, cough, stuffy nose, sore throat, no fever". No new or unexplained rash. @ home COVID19 test "Negative".

## 2023-07-25 ENCOUNTER — Ambulatory Visit: Payer: 59

## 2023-07-25 ENCOUNTER — Ambulatory Visit
Admission: EM | Admit: 2023-07-25 | Discharge: 2023-07-25 | Disposition: A | Discharge status: Home / Self Care | Payer: 59

## 2023-07-25 DIAGNOSIS — J329 Chronic sinusitis, unspecified: Secondary | ICD-10-CM

## 2023-07-25 DIAGNOSIS — J40 Bronchitis, not specified as acute or chronic: Secondary | ICD-10-CM | POA: Diagnosis not present

## 2023-07-25 HISTORY — DX: Bronchitis, not specified as acute or chronic: J32.9

## 2023-07-25 MED ORDER — DOXYCYCLINE HYCLATE 100 MG PO CAPS: 100.0000 mg | ORAL_CAPSULE | Freq: Two times a day (BID) | ORAL | 0 refills | Status: AC

## 2023-07-25 MED ORDER — PREDNISONE 20 MG PO TABS: 40.0000 mg | ORAL_TABLET | Freq: Every day | ORAL | 0 refills | Status: AC

## 2023-07-25 NOTE — ED Triage Notes (Signed)
"  I got better for a while after last visit and treatment but this Cough is still their, over 2 wks now". No fever. ? SOB.

## 2023-08-05 NOTE — ED Provider Notes (Signed)
[facility]    CSN: [id number] Arrival date & time: 07/25/23  0800      History   Chief Complaint Chief Complaint  Patient presents with   Follow-up   Cough    HPI Carol Parks is a 52 y.o. female.   Patient here today for evaluation of cough that has continued.  She states that after last visit she did improve somewhat but then cough returned and has been ongoing for over 2 weeks.  She has not had fever.  She denies any vomiting or diarrhea.  The history is provided by the patient.  Cough Associated symptoms: no chills, no ear pain, no eye discharge, no fever, no shortness of breath, no sore throat and no wheezing     Past Medical History:  Diagnosis Date   Acute non-recurrent pansinusitis 01/31/2022   GERD (gastroesophageal reflux disease)    H/O gestational diabetes mellitus, not currently pregnant    H/O iron deficiency anemia    due to donating blood   Nasal congestion 01/31/2022   Seasonal allergies    Sleep apnea    uses CPAP    Patient Active Problem List   Diagnosis Date Noted   OSA on CPAP 05/31/2021   Hiatal hernia with GERD 05/13/2017   Latex allergy 09/03/2013   Hx of reduction mammoplasty 09/03/2013    Past Surgical History:  Procedure Laterality Date   REDUCTION MAMMAPLASTY Bilateral 1991   TUBAL LIGATION Bilateral 09/05/2013   Procedure: POST PARTUM TUBAL LIGATION;  Surgeon: Esmeralda Arthur, MD;  Location: [facility];  Service: Gynecology;  Laterality: Bilateral;   WISDOM TOOTH EXTRACTION      OB History     Gravida  3   Para  3   Term  3   Preterm  0   AB  0   Living  3      SAB  0   IAB  0   Ectopic  0   Multiple  0   Live Births  1            Home Medications    Prior to Admission medications   Medication Sig Start Date End Date Taking? Authorizing Provider  cetirizine (ZYRTEC) 10 MG chewable tablet Chew 10 mg by mouth daily as needed.   Yes [provider]  Cholecalciferol (VITAMIN D3  PO) Take 1 tablet by mouth daily.   Yes [provider]  fluticasone (FLONASE) 50 MCG/ACT nasal spray Place 2 sprays into both nostrils daily. 06/29/16  Yes Jeffery, Avelino Leeds, PA  Multiple Vitamins-Iron (MULTIVITAMIN PLUS IRON ADULT PO) Take 1 tablet by mouth 2 (two) times a week.   Yes [provider]  Omega-3 Fatty Acids (FISH OIL PO) Take 1 capsule by mouth 2 (two) times a week.   Yes [provider]  omeprazole (PRILOSEC) 20 MG capsule Take 1 capsule by mouth daily.   Yes [provider]  Red Yeast Rice Extract (RED YEAST RICE PO) Take 1 capsule by mouth 2 (two) times a week.   Yes [provider]    Family History Family History  Problem Relation Age of Onset   Cancer Mother 34       breast cancer   Hypertension Mother    Early death Father    Heart disease Father    Heart attack Father    Aneurysm Father    Stroke Father    Hyperlipidemia Brother    Asthma Daughter    Mental  retardation Paternal Aunt    Heart attack Maternal Grandmother    Heart attack Paternal Grandmother    Heart attack Paternal Grandfather    Colon polyps Neg Hx    Colon cancer Neg Hx    Esophageal cancer Neg Hx    Rectal cancer Neg Hx    Stomach cancer Neg Hx     Social History Social History   Tobacco Use   Smoking status: Never    Passive exposure: Never   Smokeless tobacco: Never  Vaping Use   Vaping status: Never Used  Substance Use Topics   Alcohol use: No    Alcohol/week: 0.0 standard drinks of alcohol   Drug use: No     Allergies   Penicillins and Latex   Review of Systems Review of Systems  Constitutional:  Negative for chills and fever.  HENT:  Positive for congestion and sinus pressure. Negative for ear pain and sore throat.   Eyes:  Negative for discharge and redness.  Respiratory:  Positive for cough. Negative for shortness of breath and wheezing.   Gastrointestinal:  Negative for abdominal pain, diarrhea, nausea and vomiting.      Physical Exam Triage Vital Signs ED Triage Vitals  Encounter Vitals Group     BP 07/25/23 0827 (!) 161/85     Systolic BP Percentile --      Diastolic BP Percentile --      Pulse Rate 07/25/23 0827 83     Resp 07/25/23 0827 20     Temp 07/25/23 0827 98 F (36.7 C)     Temp Source 07/25/23 0827 Oral     SpO2 07/25/23 0827 97 %     Weight 07/25/23 0824 268 lb 1.3 oz (121.6 kg)     Height 07/25/23 0824 5\' 4"  (1.626 m)     Head Circumference --      Peak Flow --      Pain Score 07/25/23 0824 0     Pain Loc --      Pain Education --      Exclude from Growth Chart --    No data found.  Updated Vital Signs BP (!) 161/85 (BP Location: Left Arm)   Pulse 83   Temp 98 F (36.7 C) (Oral)   Resp 20   Ht 5\' 4"  (1.626 m)   Wt 268 lb 1.3 oz (121.6 kg)   LMP 06/12/2023 (Approximate)   SpO2 97%   BMI 46.02 kg/m   Visual Acuity Right Eye Distance:   Left Eye Distance:   Bilateral Distance:    Right Eye Near:   Left Eye Near:    Bilateral Near:     Physical Exam Vitals and nursing note reviewed.  Constitutional:      General: She is not in acute distress.    Appearance: Normal appearance. She is not ill-appearing.  HENT:     Head: Normocephalic and atraumatic.     Nose: Congestion present.     Mouth/Throat:     Mouth: Mucous membranes are moist.     Pharynx: No oropharyngeal exudate or posterior oropharyngeal erythema.  Eyes:     Conjunctiva/sclera: Conjunctivae normal.  Cardiovascular:     Rate and Rhythm: Normal rate and regular rhythm.     Heart sounds: Normal heart sounds. No murmur heard. Pulmonary:     Effort: Pulmonary effort is normal. No respiratory distress.     Breath sounds: Normal breath sounds. No wheezing, rhonchi or rales.  Skin:    General:  Skin is warm and dry.  Neurological:     Mental Status: She is alert.  Psychiatric:        Mood and Affect: Mood normal.        Thought Content: Thought content normal.      UC Treatments / Results   Labs (all labs ordered are listed, but only abnormal results are displayed) Labs Reviewed - No data to display  EKG   Radiology No results found.  Procedures Procedures (including critical care time)  Medications Ordered in UC Medications - No data to display  Initial Impression / Assessment and Plan / UC Course  I have reviewed the triage vital signs and the nursing notes.  Pertinent labs & imaging results that were available during my care of the patient were reviewed by me and considered in my medical decision making (see chart for details).    X-ray without acute findings.  Suspect likely sinobronchitis and will treat with doxycycline as well as steroid burst.  Recommended follow-up if no gradual improvement with any further concerns.  Final Clinical Impressions(s) / UC Diagnoses   Final diagnoses:  Sinobronchitis   Discharge Instructions   None    ED Prescriptions     Medication Sig Dispense Auth. Provider   predniSONE (DELTASONE) 20 MG tablet Take 2 tablets (40 mg total) by mouth daily with breakfast for 5 days. 10 tablet Erma Pinto F, PA-C   doxycycline (VIBRAMYCIN) 100 MG capsule Take 1 capsule (100 mg total) by mouth 2 (two) times daily for 7 days. 14 capsule Tomi Bamberger, PA-C      PDMP not reviewed this encounter.   Tomi Bamberger, PA-C 08/05/23 1408

## 2023-08-23 ENCOUNTER — Ambulatory Visit: Payer: 59 | Admitting: Sports Medicine

## 2023-08-23 VITALS — BP 136/84 | HR 73 | Temp 97.5°F | Resp 20 | Ht 64.0 in | Wt 281.0 lb

## 2023-08-23 DIAGNOSIS — R053 Chronic cough: Secondary | ICD-10-CM | POA: Diagnosis not present

## 2023-08-23 DIAGNOSIS — Z889 Allergy status to unspecified drugs, medicaments and biological substances status: Secondary | ICD-10-CM

## 2023-08-23 DIAGNOSIS — K219 Gastro-esophageal reflux disease without esophagitis: Secondary | ICD-10-CM | POA: Diagnosis not present

## 2023-08-23 DIAGNOSIS — R0982 Postnasal drip: Secondary | ICD-10-CM

## 2023-08-23 MED ORDER — BENZONATATE 200 MG PO CAPS: 200.0000 mg | ORAL_CAPSULE | Freq: Two times a day (BID) | ORAL | 0 refills | Status: DC | PRN

## 2023-08-23 MED ORDER — MONTELUKAST SODIUM 10 MG PO TABS: 10.0000 mg | ORAL_TABLET | Freq: Every day | ORAL | 3 refills | Status: DC

## 2023-08-23 NOTE — Progress Notes (Signed)
Careteam: [name], NP as PCP - General (Geriatric Medicine)  PLACE OF SERVICE:  [facility]  Advanced Directive information Does Patient Have a Medical Advance Directive?: Yes, Type of Advance Directive: Healthcare Power of [place];Living will, Does patient want to make changes to medical advance directive?: No - Patient declined  Allergies  Allergen Reactions   Penicillins Other (See Comments)    Get yeast infection   Latex Rash    Chief Complaint  Patient presents with   Acute Visit    Cough flaring up and dull headache.     HPI: Patient is a 52 y.o. female presented to clinic for cough x 6 weeks  She went to ED last month for similar complaints Chronic cough Productive cough , yellowish phlegm  Denies sob, fevers,  H/o seasonal allergies Takes zyrtec, flonase  Denies sinus pain  No h/o smoking  Using OTC cough medicines Pt states she is coughing more in the morning and at night    C/o headache  Since 1 week feels like a band  More in the occipital area Denies nausea, vomiting , blurry or double vision      Review of Systems:  Review of Systems  Constitutional:  Negative for chills and fever.  HENT:  Negative for congestion and sore throat.   Eyes:  Negative for blurred vision and double vision.  Respiratory:  Positive for cough and sputum production. Negative for shortness of breath and wheezing.   Cardiovascular:  Negative for chest pain, palpitations and leg swelling.  Gastrointestinal:  Negative for abdominal pain, heartburn and nausea.  Genitourinary:  Negative for dysuria, frequency and hematuria.  Musculoskeletal:  Negative for falls and myalgias.  Neurological:  Positive for headaches. Negative for dizziness, sensory change and focal weakness.   Negative unless indicated in HPI.   Past Medical History:  Diagnosis Date   Acute non-recurrent pansinusitis 01/31/2022   GERD (gastroesophageal reflux disease)    H/O  gestational diabetes mellitus, not currently pregnant    H/O iron deficiency anemia    due to donating blood   Nasal congestion 01/31/2022   Seasonal allergies    Sinobronchitis 07/25/2023   Sleep apnea    uses CPAP   Past Surgical History:  Procedure Laterality Date   REDUCTION MAMMAPLASTY Bilateral 1991   TUBAL LIGATION Bilateral 09/05/2013   Procedure: POST PARTUM TUBAL LIGATION;  Surgeon: Esmeralda Arthur, MD;  Location: [facility];  Service: Gynecology;  Laterality: Bilateral;   WISDOM TOOTH EXTRACTION     Social History:   reports that she has never smoked. She has never been exposed to tobacco smoke. She has never used smokeless tobacco. She reports that she does not drink alcohol and does not use drugs.  Family History  Problem Relation Age of Onset   Cancer Mother 39       breast cancer   Hypertension Mother    Early death Father    Heart disease Father    Heart attack Father    Aneurysm Father    Stroke Father    Hyperlipidemia Brother    Asthma Daughter    Mental retardation Paternal Aunt    Heart attack Maternal Grandmother    Heart attack Paternal Grandmother    Heart attack Paternal Grandfather    Colon polyps Neg Hx    Colon cancer Neg Hx    Esophageal cancer Neg Hx    Rectal cancer Neg Hx    Stomach cancer Neg Hx  Medications: Patient's Medications  New Prescriptions   No medications on file  Previous Medications   CETIRIZINE (ZYRTEC) 10 MG CHEWABLE TABLET    Chew 10 mg by mouth daily as needed.   CHOLECALCIFEROL (VITAMIN D3 PO)    Take 1 tablet by mouth daily.   FLUTICASONE (FLONASE) 50 MCG/ACT NASAL SPRAY    Place 2 sprays into both nostrils daily.   MULTIPLE VITAMINS-IRON (MULTIVITAMIN PLUS IRON ADULT PO)    Take 1 tablet by mouth 2 (two) times a week.   OMEGA-3 FATTY ACIDS (FISH OIL PO)    Take 1 capsule by mouth 2 (two) times a week.   OMEPRAZOLE (PRILOSEC) 20 MG CAPSULE    Take 1 capsule by mouth daily.   RED YEAST RICE EXTRACT (RED YEAST RICE  PO)    Take 1 capsule by mouth 2 (two) times a week.  Modified Medications   No medications on file  Discontinued Medications   No medications on file    Physical Exam: Vitals:   08/23/23 1441  BP: 136/84  Pulse: 73  Resp: 20  Temp: (!) 97.5 F (36.4 C)  SpO2: 99%  Weight: 281 lb (127.5 kg)  Height: 5\' 4"  (1.626 m)   Body mass index is 48.23 kg/m. BP Readings from Last 3 Encounters:  08/23/23 136/84  07/25/23 (!) 161/85  07/05/23 136/86   Wt Readings from Last 3 Encounters:  08/23/23 281 lb (127.5 kg)  07/25/23 268 lb 1.3 oz (121.6 kg)  07/05/23 268 lb (121.6 kg)    Physical Exam Constitutional:      Appearance: Normal appearance.  HENT:     Head: Normocephalic and atraumatic.     Mouth/Throat:     Pharynx: Posterior oropharyngeal erythema (mild erythema) present. No oropharyngeal exudate.  Cardiovascular:     Rate and Rhythm: Normal rate and regular rhythm.  Pulmonary:     Effort: Pulmonary effort is normal. No respiratory distress.     Breath sounds: Normal breath sounds. No wheezing.  Abdominal:     General: Bowel sounds are normal. There is no distension.     Tenderness: There is no abdominal tenderness. There is no guarding or rebound.     Comments:    Musculoskeletal:        General: No swelling or tenderness.  Neurological:     Mental Status: She is alert. Mental status is at baseline.     Sensory: No sensory deficit.     Motor: No weakness.     Comments: Rombergs neg Finger nose neg Strength and sensations intact  Cranial nerve intact      Labs reviewed: Basic Metabolic Panel: Recent Labs    01/18/23 0904  NA 139  K 4.3  CL 106  CO2 26  GLUCOSE 83  BUN 13  CREATININE 0.72  CALCIUM 8.9  TSH 1.25   Liver Function Tests: Recent Labs    01/18/23 0904  AST 10  ALT 11  BILITOT 0.4  PROT 6.4   No results for input(s): "LIPASE", "AMYLASE" in the last 8760 hours. No results for input(s): "AMMONIA" in the last 8760  hours. CBC: Recent Labs    01/18/23 0904  WBC 7.9  NEUTROABS 5,064  HGB 12.3  HCT 38.2  MCV 84.5  PLT 360   Lipid Panel: Recent Labs    01/18/23 0904  CHOL 181  HDL 42*  LDLCALC 116*  TRIG 121  CHOLHDL 4.3   TSH: Recent Labs    01/18/23 0904  TSH  1.25   A1C: Lab Results  Component Value Date   HGBA1C 5.5 01/18/2023     Assessment/Plan 1. Chronic cough (Primary) Lungs clear - benzonatate (TESSALON) 200 MG capsule; Take 1 capsule (200 mg total) by mouth 2 (two) times daily as needed for cough.  Dispense: 20 capsule; Refill: 0  2. Post-nasal drip 3. H/O seasonal allergies Instructed to use flonase, zyrtec Will start singulair  - montelukast (SINGULAIR) 10 MG tablet; Take 1 tablet (10 mg total) by mouth at bedtime.  Dispense: 30 tablet; Refill: 3  4. Gastroesophageal reflux disease, unspecified whether esophagitis present Avoid spicy foods  Take omeprazole.  No follow-ups on file.:    Carol Parks

## 2023-08-24 ENCOUNTER — Ambulatory Visit: Payer: 59 | Admitting: Nurse Practitioner

## 2023-11-07 ENCOUNTER — Encounter: Payer: Self-pay | Admitting: Nurse Practitioner

## 2023-12-25 ENCOUNTER — Other Ambulatory Visit: Payer: Self-pay | Admitting: Nurse Practitioner

## 2023-12-25 DIAGNOSIS — Z1231 Encounter for screening mammogram for malignant neoplasm of breast: Secondary | ICD-10-CM

## 2023-12-28 ENCOUNTER — Other Ambulatory Visit: Payer: Self-pay | Admitting: Nurse Practitioner

## 2023-12-28 DIAGNOSIS — Z1231 Encounter for screening mammogram for malignant neoplasm of breast: Secondary | ICD-10-CM

## 2024-01-11 ENCOUNTER — Encounter (HOSPITAL_COMMUNITY): Payer: Self-pay

## 2024-01-18 ENCOUNTER — Ambulatory Visit
Admission: RE | Admit: 2024-01-18 | Discharge: 2024-01-18 | Disposition: A | Discharge status: Home / Self Care | Source: Ambulatory Visit | Attending: Nurse Practitioner | Admitting: Nurse Practitioner

## 2024-01-18 DIAGNOSIS — Z1231 Encounter for screening mammogram for malignant neoplasm of breast: Secondary | ICD-10-CM

## 2024-03-04 ENCOUNTER — Other Ambulatory Visit: Payer: Self-pay | Admitting: Nurse Practitioner

## 2024-03-04 ENCOUNTER — Encounter: Payer: Self-pay | Admitting: Nurse Practitioner

## 2024-03-04 ENCOUNTER — Ambulatory Visit (INDEPENDENT_AMBULATORY_CARE_PROVIDER_SITE_OTHER): Payer: 59 | Admitting: Nurse Practitioner

## 2024-03-04 VITALS — BP 124/75 | HR 84 | Temp 98.0°F | Resp 18 | Ht 64.0 in | Wt 279.6 lb

## 2024-03-04 DIAGNOSIS — G4733 Obstructive sleep apnea (adult) (pediatric): Secondary | ICD-10-CM

## 2024-03-04 DIAGNOSIS — E782 Mixed hyperlipidemia: Secondary | ICD-10-CM | POA: Diagnosis not present

## 2024-03-04 DIAGNOSIS — Z0000 Encounter for general adult medical examination without abnormal findings: Secondary | ICD-10-CM

## 2024-03-04 MED ORDER — TIRZEPATIDE-WEIGHT MANAGEMENT 2.5 MG/0.5ML ~~LOC~~ SOLN: 2.5000 mg | SUBCUTANEOUS | 0 refills | Status: DC

## 2024-03-04 NOTE — Progress Notes (Signed)
 Provider: [name], NP  Patient Care Team: [name], NP as PCP - General (Geriatric Medicine)  Extended Emergency Contact Information Primary Emergency Contact: Tappen,Jimmy Address: [address]          [place], [place] [address] [place] Mobile Phone: [phone] Relation: Spouse Allergies  Allergen Reactions   Penicillins Other (See Comments)    Get yeast infection   Latex Rash   Code Status: FULL Goals of Care: Advanced Directive information    08/23/2023    2:36 PM  Advanced Directives  Does Patient Have a Medical Advance Directive? Yes  Type of Estate agent of [place];Living will  Does patient want to make changes to medical advance directive? No - Patient declined  Copy of Healthcare Power of Attorney in Chart? No - copy requested     Chief Complaint  Patient presents with   Annual Exam    Physical, need to discuss Hepatitis B and Shingles vaccine    HPI: Patient is a 53 y.o. female seen in today for an wellness exam at [facility]  Discussed the use of AI scribe software for clinical note transcription with the patient, who gave verbal consent to proceed.  History of Present Illness Carol Parks is a 53 year old female who presents for an annual physical exam.  She has a history of high cholesterol, managed with red yeast rice and fish oil supplements. Her cholesterol levels were borderline last year.  She engages in physical activity, aiming for 5,000 steps a day, but her schedule makes consistency difficult. She previously participated in a weight loss program through her insurance, resulting in an 8-pound loss over three to four months, but struggled to maintain long-term results. She is interested in exploring other weight loss options covered by her insurance.  She has sleep apnea and uses a CPAP machine daily.  She underwent breast reduction surgery at the age of 78 due to large breast size causing  discomfort and potential back pain. She recalls having 23 staples on each side post-surgery, which was covered by insurance. She has a family history of large breasts, with several relatives having undergone similar surgeries.  No chest pain, shortness of breath, or abnormal aches and pains. Reports increased gas and bloating, but bowel movements are regular.       03/04/2024    8:11 AM 08/23/2023    2:35 PM 01/18/2023    8:16 AM 12/21/2021    2:46 PM 12/10/2020    2:06 PM  Depression screen PHQ 2/9  Decreased Interest 0 0 0 0 0  Down, Depressed, Hopeless 0 0 0 0 0  PHQ - 2 Score 0 0 0 0 0  Altered sleeping    0 0  Tired, decreased energy    0 0  Change in appetite    0 0  Feeling bad or failure about yourself     0 0  Trouble concentrating    0 0  Moving slowly or fidgety/restless    0 0  Suicidal thoughts    0 0  PHQ-9 Score    0 0  Difficult doing work/chores    Not difficult at all        12/21/2021    2:46 PM 01/18/2023    8:16 AM 03/03/2023    8:28 AM 08/23/2023    2:35 PM 03/04/2024    8:11 AM  Fall Risk  Falls in the past year? 0 0 0  0  Was there an injury with Fall? 0 0 0 0 0  Fall Risk Category Calculator 0 0 0  0  Fall Risk Category (Retired) Low       (RETIRED) Patient Fall Risk Level Low fall risk       Patient at Risk for Falls Due to No Fall Risks No Fall Risks No Fall Risks  No Fall Risks  Fall risk Follow up Falls evaluation completed  Falls evaluation completed Falls evaluation completed  Falls evaluation completed     Data saved with a previous flowsheet row definition       No data to display           Health Maintenance  Topic Date Due   Hepatitis B Vaccines (1 of 3 - 19+ 3-dose series) Never done   Zoster Vaccines- Shingrix (1 of 2) Never done   COVID-19 Vaccine (5 - 2024-25 season) 04/06/2024 (Originally 05/07/2023)   INFLUENZA VACCINE  04/05/2024   MAMMOGRAM  01/17/2026   Cervical Cancer Screening (HPV/Pap Cotest)  03/02/2026   Colonoscopy   04/22/2028   DTaP/Tdap/Td (3 - Td or Tdap) 07/24/2028   Hepatitis C Screening  Completed   HIV Screening  Completed   HPV VACCINES  Aged Out   Meningococcal B Vaccine  Aged Out    Past Medical History:  Diagnosis Date   Acute non-recurrent pansinusitis 01/31/2022   GERD (gastroesophageal reflux disease)    H/O gestational diabetes mellitus, not currently pregnant    H/O iron deficiency anemia    due to donating blood   Nasal congestion 01/31/2022   Seasonal allergies    Sinobronchitis 07/25/2023   Sleep apnea    uses CPAP    Past Surgical History:  Procedure Laterality Date   REDUCTION MAMMAPLASTY Bilateral 1991   TUBAL LIGATION Bilateral 09/05/2013   Procedure: POST PARTUM TUBAL LIGATION;  Surgeon: Nena DELENA App, MD;  Location: [facility];  Service: Gynecology;  Laterality: Bilateral;   WISDOM TOOTH EXTRACTION      Social History   Socioeconomic History   Marital status: Married    Spouse name: Not on file   Number of children: Not on file   Years of education: Not on file   Highest education level: Bachelor's degree (e.g., BA, AB, BS)  Occupational History   Occupation: Social worker  Tobacco Use   Smoking status: Never    Passive exposure: Never   Smokeless tobacco: Never  Vaping Use   Vaping status: Never Used  Substance and Sexual Activity   Alcohol use: No    Alcohol/week: 0.0 standard drinks of alcohol   Drug use: No   Sexual activity: Yes    Birth control/protection: Surgical  Other Topics Concern   Not on file  Social History Narrative   Lives at home with husband and 3 children.    Social Drivers of Corporate investment banker Strain: Low Risk  (08/23/2023)   Overall Financial Resource Strain (CARDIA)    Difficulty of Paying Living Expenses: Not hard at all  Food Insecurity: No Food Insecurity (08/23/2023)   Hunger Vital Sign    Worried About Running Out of Food in the Last Year: Never true    Ran Out of Food in the Last Year: Never true   Transportation Needs: No Transportation Needs (08/23/2023)   PRAPARE - Administrator, Civil Service (Medical): No    Lack of Transportation (Non-Medical): No  Physical Activity: Insufficiently Active (08/23/2023)   Exercise  Vital Sign    Days of Exercise per Week: 3 days    Minutes of Exercise per Session: 30 min  Stress: No Stress Concern Present (08/23/2023)   [employer] of Occupational Health - Occupational Stress Questionnaire    Feeling of Stress : Not at all  Social Connections: Socially Integrated (08/23/2023)   Social Connection and Isolation Panel    Frequency of Communication with Friends and Family: Twice a week    Frequency of Social Gatherings with Friends and Family: Once a week    Attends Religious Services: More than 4 times per year    Active Member of [employer] or Organizations: Yes    Attends Engineer, structural: More than 4 times per year    Marital Status: Married    Family History  Problem Relation Age of Onset   Cancer Mother 58       breast cancer   Hypertension Mother    Early death Father    Heart disease Father    Heart attack Father    Aneurysm Father    Stroke Father    Hyperlipidemia Brother    Asthma Daughter    Mental retardation Paternal Aunt    Heart attack Maternal Grandmother    Heart attack Paternal Grandmother    Heart attack Paternal Grandfather    Colon polyps Neg Hx    Colon cancer Neg Hx    Esophageal cancer Neg Hx    Rectal cancer Neg Hx    Stomach cancer Neg Hx     Review of Systems:  Review of Systems  Constitutional:  Negative for chills, fever and weight loss.  HENT:  Negative for tinnitus.   Respiratory:  Negative for cough, sputum production and shortness of breath.   Cardiovascular:  Negative for chest pain, palpitations and leg swelling.  Gastrointestinal:  Negative for abdominal pain, constipation, diarrhea and heartburn.  Genitourinary:  Negative for dysuria, frequency and urgency.   Musculoskeletal:  Negative for back pain, falls, joint pain and myalgias.  Skin: Negative.   Neurological:  Negative for dizziness and headaches.  Psychiatric/Behavioral:  Negative for depression and memory loss. The patient does not have insomnia.      Allergies as of 03/04/2024       Reactions   Penicillins Other (See Comments)   Get yeast infection   Latex Rash        Medication List        Accurate as of March 04, 2024  8:30 AM. If you have any questions, ask your nurse or doctor.          benzonatate  200 MG capsule Commonly known as: TESSALON  Take 1 capsule (200 mg total) by mouth 2 (two) times daily as needed for cough.   cetirizine 10 MG chewable tablet Commonly known as: ZYRTEC Chew 10 mg by mouth daily as needed.   FISH OIL PO Take 1 capsule by mouth 2 (two) times a week.   fluticasone  50 MCG/ACT nasal spray Commonly known as: FLONASE  Place 2 sprays into both nostrils daily.   montelukast  10 MG tablet Commonly known as: SINGULAIR  Take 1 tablet (10 mg total) by mouth at bedtime.   MULTIVITAMIN PLUS IRON ADULT PO Take 1 tablet by mouth 2 (two) times a week.   omeprazole  20 MG capsule Commonly known as: PRILOSEC Take 1 capsule by mouth daily.   RED YEAST RICE PO Take 1 capsule by mouth 2 (two) times a week.   VITAMIN D3 PO Take 1  tablet by mouth daily.          Physical Exam: Vitals:   03/04/24 0808  BP: 124/75  Pulse: 84  Resp: 18  Temp: 98 F (36.7 C)  SpO2: 97%  Weight: 279 lb 9.6 oz (126.8 kg)  Height: 5' 4 (1.626 m)   Body mass index is 47.99 kg/m. Wt Readings from Last 3 Encounters:  03/04/24 279 lb 9.6 oz (126.8 kg)  08/23/23 281 lb (127.5 kg)  07/25/23 268 lb 1.3 oz (121.6 kg)    Physical Exam Constitutional:      General: She is not in acute distress.    Appearance: She is well-developed. She is not diaphoretic.  HENT:     Head: Normocephalic and atraumatic.     Mouth/Throat:     Pharynx: No oropharyngeal  exudate.   Eyes:     Conjunctiva/sclera: Conjunctivae normal.     Pupils: Pupils are equal, round, and reactive to light.    Cardiovascular:     Rate and Rhythm: Normal rate and regular rhythm.     Heart sounds: Normal heart sounds.  Pulmonary:     Effort: Pulmonary effort is normal.     Breath sounds: Normal breath sounds.  Chest:  Breasts:    Right: Normal.     Left: Normal.  Abdominal:     General: Bowel sounds are normal.     Palpations: Abdomen is soft.   Musculoskeletal:     Cervical back: Normal range of motion and neck supple.     Right lower leg: No edema.     Left lower leg: No edema.   Skin:    General: Skin is warm and dry.   Neurological:     Mental Status: She is alert.   Psychiatric:        Mood and Affect: Mood normal.     Labs reviewed: Basic Metabolic Panel: No results for input(s): NA, K, CL, CO2, GLUCOSE, BUN, CREATININE, CALCIUM, MG, PHOS, TSH in the last 8760 hours. Liver Function Tests: No results for input(s): AST, ALT, ALKPHOS, BILITOT, PROT, ALBUMIN in the last 8760 hours. No results for input(s): LIPASE, AMYLASE in the last 8760 hours. No results for input(s): AMMONIA in the last 8760 hours. CBC: No results for input(s): WBC, NEUTROABS, HGB, HCT, MCV, PLT in the last 8760 hours. Lipid Panel: No results for input(s): CHOL, HDL, LDLCALC, TRIG, CHOLHDL, LDLDIRECT in the last 8760 hours. Lab Results  Component Value Date   HGBA1C 5.5 01/18/2023    Procedures: No results found.  Assessment/Plan Assessment and Plan Assessment & Plan  Morbid Obesity BMI 47 indicates morbid obesity with increased comorbidity risk. Discussed pharmacological options and side effects. Emphasized motivation and adherence to lifestyle changes. - Confirm insurance coverage for weight loss medications (Wegovy, Zepbound). - Submit exercise and diet regimen to insurance for prior  authorization.  Obstructive Sleep Apnea Uses CPAP daily. Zepbound coverage may be relevant for weight management. - Inquire with insurance about Zepbound coverage in the context of obstructive sleep apnea.  Hyperlipidemia Borderline high cholesterol managed with red yeast rice and fish oil. - Order fasting lipid panel to recheck cholesterol levels.  General Health Maintenance/preventive healthcare -wellness visit completed today, The patient was counseled regarding the appropriate use of alcohol, regular self-examination of the breasts on a monthly basis, prevention of dental and periodontal disease, diet, regular sustained exercise for at least 30 minutes 5 times per week, routine screening interval for mammogram as recommended by the American Cancer Society  and ACOG, importance of regular PAP smears, tobacco use,  and recommended schedule for GI hemoccult testing, colonoscopy, cholesterol, thyroid and diabetes screening. Up to date on mammogram and Pap smear. Emphasized weight training for bone health. - Encourage weight training to support bone health. - Promote regular exercise and a balanced diet.    Next appt: yearly and PRN Carol Parks K. Caro BODILY  [facility] [phone]

## 2024-03-04 NOTE — Telephone Encounter (Signed)
  Pharmacy comment: Product Backordered/Unavailable:PLEASE SEND FOR PENS.

## 2024-03-05 ENCOUNTER — Ambulatory Visit: Payer: Self-pay | Admitting: Nurse Practitioner

## 2024-03-05 LAB — COMPREHENSIVE METABOLIC PANEL WITH GFR
AG Ratio: 1.2 (calc) (ref 1.0–2.5)
ALT: 13 U/L (ref 6–29)
AST: 11 U/L (ref 10–35)
Albumin: 3.9 g/dL (ref 3.6–5.1)
Alkaline phosphatase (APISO): 82 U/L (ref 37–153)
BUN: 15 mg/dL (ref 7–25)
CO2: 23 mmol/L (ref 20–32)
Calcium: 8.7 mg/dL (ref 8.6–10.4)
Chloride: 105 mmol/L (ref 98–110)
Creat: 0.72 mg/dL (ref 0.50–1.03)
Globulin: 3.2 g/dL (ref 1.9–3.7)
Glucose, Bld: 95 mg/dL (ref 65–99)
Potassium: 4.1 mmol/L (ref 3.5–5.3)
Sodium: 138 mmol/L (ref 135–146)
Total Bilirubin: 0.5 mg/dL (ref 0.2–1.2)
Total Protein: 7.1 g/dL (ref 6.1–8.1)
eGFR: 101 mL/min/{1.73_m2} (ref >=60)

## 2024-03-05 LAB — CBC WITH DIFFERENTIAL/PLATELET
Absolute Lymphocytes: 2279 {cells}/uL (ref 850–3900)
Absolute Monocytes: 832 {cells}/uL (ref 200–950)
Basophils Absolute: 86 {cells}/uL (ref 0–200)
Basophils Relative: 0.8 %
Eosinophils Absolute: 238 {cells}/uL (ref 15–500)
Eosinophils Relative: 2.2 %
HCT: 41.7 % (ref 35.0–45.0)
Hemoglobin: 12.8 g/dL (ref 11.7–15.5)
MCH: 25 pg — ABNORMAL LOW (ref 27.0–33.0)
MCHC: 30.7 g/dL — ABNORMAL LOW (ref 32.0–36.0)
MCV: 81.6 fL (ref 80.0–100.0)
MPV: 9.5 fL (ref 7.5–12.5)
Monocytes Relative: 7.7 %
Neutro Abs: 7366 {cells}/uL (ref 1500–7800)
Neutrophils Relative %: 68.2 %
Platelets: 378 10*3/uL (ref 140–400)
RBC: 5.11 10*6/uL — ABNORMAL HIGH (ref 3.80–5.10)
RDW: 15.6 % — ABNORMAL HIGH (ref 11.0–15.0)
Total Lymphocyte: 21.1 %
WBC: 10.8 10*3/uL (ref 3.8–10.8)

## 2024-03-05 LAB — LIPID PANEL
Cholesterol: 204 mg/dL — ABNORMAL HIGH (ref <200)
HDL: 54 mg/dL (ref >=50)
LDL Cholesterol (Calc): 129 mg/dL — ABNORMAL HIGH
Non-HDL Cholesterol (Calc): 150 mg/dL — ABNORMAL HIGH (ref <130)
Total CHOL/HDL Ratio: 3.8 (calc) (ref <5.0)
Triglycerides: 104 mg/dL (ref <150)

## 2024-03-05 MED ORDER — ZEPBOUND 2.5 MG/0.5ML ~~LOC~~ SOAJ: 2.5000 mg | SUBCUTANEOUS | 0 refills | Status: DC

## 2024-03-05 NOTE — Telephone Encounter (Signed)
 Pharmacy comment: Script Clarification:PLEASE SEND RX WITH SIG, AND SPECIFIED REFILLS.

## 2024-03-12 ENCOUNTER — Telehealth

## 2024-03-12 NOTE — Telephone Encounter (Signed)
 Form given to administrative staff to fax back

## 2024-03-12 NOTE — Telephone Encounter (Signed)
 Okay to pick the second option for the 2.5 mg weekly for 4 weeks then then to titrate up if tolerates

## 2024-03-12 NOTE — Telephone Encounter (Signed)
 Please see medication clarification request and advise which option to select and I will stamp and fax back.

## 2024-03-13 NOTE — Telephone Encounter (Addendum)
 Outgoing call placed to [facility] to find out is needed per patient request, after a 16 minute hold I was told that nothing is needed from us  and the medication will be shipped today.

## 2024-03-15 MED ORDER — ZEPBOUND 5 MG/0.5ML ~~LOC~~ SOAJ: 5.0000 mg | SUBCUTANEOUS | 0 refills | Status: DC

## 2024-03-15 NOTE — Telephone Encounter (Signed)
Message routed to PCP Eubanks, Jessica K, NP  

## 2024-03-15 NOTE — Telephone Encounter (Addendum)
My Chart message sent to patient with NP response.

## 2024-03-15 NOTE — Addendum Note (Signed)
 Addended by: Aquilla Voiles K on: 03/15/2024 11:20 AM   Modules accepted: Orders

## 2024-05-13 ENCOUNTER — Encounter: Payer: Self-pay | Admitting: Nurse Practitioner

## 2024-05-13 NOTE — Telephone Encounter (Signed)
 Is this okay?

## 2024-05-20 ENCOUNTER — Ambulatory Visit: Admitting: Nurse Practitioner

## 2024-05-20 ENCOUNTER — Encounter: Payer: Self-pay | Admitting: Nurse Practitioner

## 2024-05-20 DIAGNOSIS — R030 Elevated blood-pressure reading, without diagnosis of hypertension: Secondary | ICD-10-CM | POA: Diagnosis not present

## 2024-05-20 DIAGNOSIS — K219 Gastro-esophageal reflux disease without esophagitis: Secondary | ICD-10-CM

## 2024-05-20 DIAGNOSIS — G4733 Obstructive sleep apnea (adult) (pediatric): Secondary | ICD-10-CM | POA: Diagnosis not present

## 2024-05-20 NOTE — Progress Notes (Unsigned)
 S   Careteam: Patient Care Team: Carol Harlene POUR, NP as PCP - General (Geriatric Medicine)  PLACE OF SERVICE:  [facility]  Advanced Directive information Does Patient Have a Medical Advance Directive?: Yes, Type of Advance Directive: Healthcare Power of [place];Living will, Does patient want to make changes to medical advance directive?: No - Patient declined  Allergies  Allergen Reactions   Penicillins Other (See Comments)    Get yeast infection   Latex Rash    Chief Complaint  Patient presents with   Medical Management of Chronic Issues    Medical Management of Chronic Issues. Medication Management Zepbound .     HPI:  Discussed the use of AI scribe software for clinical note transcription with the patient, who gave verbal consent to proceed.  History of Present Illness Carol Parks is a 53 year old female who presents for follow-up and medication management of Zetban.  She has been on Zetban for weight management since March 24, 2024, starting at a dose of 2.5 mg and currently on 5 mg weekly. She has experienced a weight loss of close to ten pounds over the past two months. She experiences nausea, gas, and occasional vomiting, particularly on Mondays and Tuesdays after her Sunday dose, but these symptoms improve by mid-week. She notes a decreased appetite and has been increasing her physical activity, including hiking and walking with her children.  She has a history of obstructive sleep apnea and uses a CPAP machine. She is also taking omeprazole  for gastrointestinal symptoms, which include burping and nausea, particularly at night. She occasionally uses Pepto Bismol and tums for symptom relief. She avoids sodas and straws to minimize gas and burping.  She does not monitor her blood pressure at home but notes it is often elevated during medical visits.   Review of Systems:  Review of Systems  Constitutional:  Negative for chills, fever and weight loss.  HENT:  Negative  for tinnitus.   Respiratory:  Negative for cough, sputum production and shortness of breath.   Cardiovascular:  Negative for chest pain, palpitations and leg swelling.  Gastrointestinal:  Positive for heartburn and nausea. Negative for abdominal pain, constipation and diarrhea.  Genitourinary:  Negative for dysuria, frequency and urgency.  Musculoskeletal:  Negative for back pain, falls, joint pain and myalgias.  Skin: Negative.   Neurological:  Negative for dizziness and headaches.  Psychiatric/Behavioral:  Negative for depression and memory loss. The patient does not have insomnia.     Past Medical History:  Diagnosis Date   Acute non-recurrent pansinusitis 01/31/2022   GERD (gastroesophageal reflux disease)    H/O gestational diabetes mellitus, not currently pregnant    H/O iron deficiency anemia    due to donating blood   Nasal congestion 01/31/2022   Seasonal allergies    Sinobronchitis 07/25/2023   Sleep apnea    uses CPAP   Past Surgical History:  Procedure Laterality Date   REDUCTION MAMMAPLASTY Bilateral 1991   TUBAL LIGATION Bilateral 09/05/2013   Procedure: POST PARTUM TUBAL LIGATION;  Surgeon: Nena DELENA App, MD;  Location: [facility];  Service: Gynecology;  Laterality: Bilateral;   WISDOM TOOTH EXTRACTION     Social History:   reports that she has never smoked. She has never been exposed to tobacco smoke. She has never used smokeless tobacco. She reports that she does not drink alcohol and does not use drugs.  Family History  Problem Relation Age of Onset   Cancer Mother 20  breast cancer   Hypertension Mother    Early death Father    Heart disease Father    Heart attack Father    Aneurysm Father    Stroke Father    Hyperlipidemia Brother    Asthma Daughter    Mental retardation Paternal Aunt    Heart attack Maternal Grandmother    Heart attack Paternal Grandmother    Heart attack Paternal Grandfather    Colon polyps Neg Hx    Colon cancer Neg Hx     Esophageal cancer Neg Hx    Rectal cancer Neg Hx    Stomach cancer Neg Hx     Medications: Patient's Medications  New Prescriptions   No medications on file  Previous Medications   CETIRIZINE (ZYRTEC) 10 MG CHEWABLE TABLET    Chew 10 mg by mouth daily as needed.   CHOLECALCIFEROL (VITAMIN D3 PO)    Take 1 tablet by mouth daily.   FLUTICASONE  (FLONASE ) 50 MCG/ACT NASAL SPRAY    Place 2 sprays into both nostrils daily.   MULTIPLE VITAMINS-IRON (MULTIVITAMIN PLUS IRON ADULT PO)    Take 1 tablet by mouth 2 (two) times a week.   OMEGA-3 FATTY ACIDS (FISH OIL PO)    Take 1 capsule by mouth 2 (two) times a week.   OMEPRAZOLE  (PRILOSEC) 20 MG CAPSULE    Take 1 capsule by mouth daily.   RED YEAST RICE EXTRACT (RED YEAST RICE PO)    Take 1 capsule by mouth 2 (two) times a week.   TIRZEPATIDE  (ZEPBOUND ) 2.5 MG/0.5ML PEN    Inject 2.5 mg into the skin once a week.   TIRZEPATIDE  (ZEPBOUND ) 5 MG/0.5ML PEN    Inject 5 mg into the skin once a week.  Modified Medications   No medications on file  Discontinued Medications   No medications on file    Physical Exam:  Vitals:   05/20/24 1140 05/20/24 1143  BP: (!) 148/86 (!) 142/86  Pulse: 91   Temp: 98.1 F (36.7 C)   SpO2: 99%   Weight: 273 lb 6.4 oz (124 kg)   Height: 5' 4 (1.626 m)    Body mass index is 46.93 kg/m. Wt Readings from Last 3 Encounters:  05/20/24 273 lb 6.4 oz (124 kg)  03/04/24 279 lb 9.6 oz (126.8 kg)  08/23/23 281 lb (127.5 kg)    Physical Exam Constitutional:      General: She is not in acute distress.    Appearance: She is well-developed. She is not diaphoretic.  HENT:     Head: Normocephalic and atraumatic.     Mouth/Throat:     Pharynx: No oropharyngeal exudate.  Eyes:     Conjunctiva/sclera: Conjunctivae normal.     Pupils: Pupils are equal, round, and reactive to light.  Cardiovascular:     Rate and Rhythm: Normal rate and regular rhythm.     Heart sounds: Normal heart sounds.  Pulmonary:     Effort:  Pulmonary effort is normal.     Breath sounds: Normal breath sounds.  Abdominal:     General: Bowel sounds are normal.     Palpations: Abdomen is soft.  Musculoskeletal:     Cervical back: Normal range of motion and neck supple.     Right lower leg: No edema.     Left lower leg: No edema.  Skin:    General: Skin is warm and dry.  Neurological:     Mental Status: She is alert.  Psychiatric:  Mood and Affect: Mood normal.     Labs reviewed: Basic Metabolic Panel: Recent Labs    03/04/24 0856  NA 138  K 4.1  CL 105  CO2 23  GLUCOSE 95  BUN 15  CREATININE 0.72  CALCIUM 8.7   Liver Function Tests: Recent Labs    03/04/24 0856  AST 11  ALT 13  BILITOT 0.5  PROT 7.1   No results for input(s): LIPASE, AMYLASE in the last 8760 hours. No results for input(s): AMMONIA in the last 8760 hours. CBC: Recent Labs    03/04/24 0856  WBC 10.8  NEUTROABS 7,366  HGB 12.8  HCT 41.7  MCV 81.6  PLT 378   Lipid Panel: Recent Labs    03/04/24 0856  CHOL 204*  HDL 54  LDLCALC 129*  TRIG 104  CHOLHDL 3.8   TSH: No results for input(s): TSH in the last 8760 hours. A1C: Lab Results  Component Value Date   HGBA1C 5.5 01/18/2023     Assessment/Plan  Assessment and Plan Assessment & Plan Morbid obesity due to excess calories On zepbound  since July 20th with 10-pound weight loss. Experiences nausea and gas post-dose, subsiding mid-week. Appetite decreased, increasing physical activity. Current dose 5 mg, seven doses left. Maintenance dose 10 mg recommended, monitor weight and side effects before increase.  - Continue zepbound  at 5 mg weekly. - Monitor weight and side effects. - Consider increasing zepbound  to 10 mg with next refill if weight loss plateaus or side effects are tolerable. - Encourage increased physical activity. - Advise on dietary habits, including eating protein first.  Gastroesophageal reflux disease (GERD) Nausea and gas  post-zepbound  managed with omeprazole . Occasionally uses Pepto Bismol or tums for nausea. - Continue omeprazole  for GERD management. - Avoid sodas and straws to reduce gas intake. - Use Pepto Bismol or tums as needed for nausea.  OSA Continues on CPAP  Elevated blood pressure Blood pressure 138/98, possibly situational. Does not monitor at home. Advised low sodium diet and DASH diet recommendations provided. - Monitor blood pressure at future visits. - Follow a low sodium diet. - Provide DASH diet recommendations.    Return in about 3 months (around 08/19/2024) for routine follow up, labs at time of visit.  Gunhild Bautch K. Carol BODILY [facility] [phone]

## 2024-06-04 ENCOUNTER — Encounter: Payer: Self-pay | Admitting: Nurse Practitioner

## 2024-06-04 DIAGNOSIS — G4733 Obstructive sleep apnea (adult) (pediatric): Secondary | ICD-10-CM

## 2024-06-04 MED ORDER — TIRZEPATIDE-WEIGHT MANAGEMENT 10 MG/0.5ML ~~LOC~~ SOLN: 10.0000 mg | SUBCUTANEOUS | 3 refills | Status: DC

## 2024-06-17 NOTE — Telephone Encounter (Signed)
 Message routed to covering provider Tawni America, NP.

## 2024-06-17 NOTE — Telephone Encounter (Signed)
 Darlean Maus, NP to Me (Selected Message)     06/17/24  9:34 AM Carol Parks  I see that a prescription was sent to [facility] is there something else that needs to be done?   Thanks   Bari Darlean NP

## 2024-06-17 NOTE — Telephone Encounter (Signed)
 Yes I also see that prescription has been sent in already. Sometimes these systems glitch or have issues and order doesn't come through. If you dont mind resending in prescription for patient and I will notify her to check with [facility] again.

## 2024-06-19 MED ORDER — TIRZEPATIDE-WEIGHT MANAGEMENT 10 MG/0.5ML ~~LOC~~ SOAJ: 10.0000 mg | SUBCUTANEOUS | 3 refills | Status: AC

## 2024-06-19 NOTE — Telephone Encounter (Signed)
 I called [facility], spoke with Redell and it was discovered that rx was received on 06/04/24 , however placed on hold due to needing clarification. Provider had sent rx for vials and the vials are not available through [facility]. Redell asked if ok to switch to the pen.   I reviewed history, it appeared patient had gotten pen in the past and I gave the ok to switch to a pen. I called [facility] and provided an update.

## 2024-06-19 NOTE — Telephone Encounter (Signed)
 Copied from CRM #[id number]. Topic: Clinical - Prescription Issue >> Jun 19, 2024  9:56 AM DeAngela L wrote: Reason for CRM: patient is calling to check the status of prescription that was sent to [facility] patient checked on 06/17/24 and there was still no prescriptions ready, the patient just logged in still no prescription available this morning    Zepbound  10mg  and the patient doesn't see a new prescription, and would like to ask if he needs to contact [facility]   Pt num [phone] (M) ok to leave a detail message

## 2024-06-19 NOTE — Telephone Encounter (Signed)
 Thank you for fixing this.

## 2024-06-24 NOTE — Progress Notes (Unsigned)
 Carol Parks

## 2024-06-24 NOTE — Progress Notes (Unsigned)
 PATIENT: Carol Parks DOB: [birth year]  REASON FOR VISIT: follow up HISTORY FROM: patient  Virtual Visit via Telephone Note  I connected with Carol Parks on 06/25/24 at  8:00 AM EDT by telephone and verified that I am speaking with the correct person using two identifiers.   I discussed the limitations, risks, security and privacy concerns of performing an evaluation and management service by telephone and the availability of in person appointments. I also discussed with the patient that there may be a patient responsible charge related to this service. The patient expressed understanding and agreed to proceed.   History of Present Illness:  06/25/24 ALL: Carol Parks returns for follow up for OSA on CPAP. She continues to do well on therapy. She is using CPAP nightly for about 6 hours, on average. She does take break about once a week. She is sleeping well. Snoring is resolved on therapy. She denies concerns with machine or supplies. She is eligible for a new machine but wishes to hold off on repeat HST at this time.     06/13/2023 ALL:  Carol Parks returns for follow up for OSA on CPAp. She is doing well on therapy. She uses CPAP most nights for about 5-6 hours. She does not use therapy on Fridays. She does note more snoring on nights she doesn't use her machine. She denies concerns with machine or supplies. Occasionally feels headgear may cause a headache. Using FFM.     06/03/2022 ALL:  Carol Parks returns for follow up for OSA on CPAP. She continues to do well on therapy. She does continue CPAP holiday on Fridays. She is using FFM and notes some leaking at times when on her side. She feels mask is comfortable. Apnea well managed.     06/01/2021 ALL: She returns for follow up for OSA on CPAP. She is doing fairly well with CPAP therapy. She had Covid mid September and was unable to use her machine for about a week. She continues to take a therapy holiday on Friday nights. She is using a FFM. She has  some intermittent tenderness of the back of her head from the headgear. She has tried multiple masks and feels this is the best option. She has been disappointed about not being able to lose weight.     05/26/2020 ALL:  Carol Parks is a 53 y.o. female here today for follow up for OSA on CPAP.  She continues to do well with CPAP therapy.  She admits that she usually takes 1 day off during the week.  She does note significant improvement in daytime energy and improved sleep quality.  She denies any concerns today.  Compliance report dated 04/24/2020 through 05/23/2020 reveals that she used CPAP 24 of the past 30 days for compliance of 80%.  She used CPAP greater than 4 hours 23 of the past 30 days for compliance of 77%.  Average usage was 6 hours and 9 minutes.  Residual AHI was 1.4 on 6 to 12 cm of water and an EPR of 3.  There was no significant leak noted.  History (copied from my note on 11/21/2019)  Carol Parks is a 53 y.o. female here today for follow up for OSA recently started on CPAP therapy. HST in 07/2019 revealed total AHI of 21.6/hr and O2nadir of 80%. She is doing fairly well with CPAP therapy. She has tried several masks and feels that the FFM is the best fit. She continues to have difficulty sleeping on her side but feels  she is adjusting. She Does note that she feels better rested when she uses CPAP.    Compliance report dated 10/20/2019 through 11/18/2019 reveals that she used CPAP 24 of the last 30 days for compliance of 80%.  23 of the last 30 days she used CPAP greater than 4 hours for compliance of 77%.  Average usage on days used was 5 hours and 57 minutes.  Residual AHI was 2.0 on 6 to 12 cm of water and an EPR of 3.  There was no significant leak noted.     HISTORY: (copied from Dr Obie note on 11/14/2018)   Dear Dr. Melonie,   I saw your patient, Carol Parks, upon your kind request in my sleep clinic today for initial consultation of her sleep disorder, in particular, concern  for underlying obstructive sleep apnea. The patient is unaccompanied today. As you know, Carol Parks is a 53 year old right-handed woman with an underlying medical history of allergies, reflux disease, nasal polyps, anemia and obesity, who reports snoring and excessive daytime somnolence. I reviewed your office note from 10/19/2018. She lives at home with her husband and 3 children, ages 28, 37 and 37. Time is generally late, she admits that she tends to stay up late. She is in bed between 11 and 11:30, rise time is 6:30. She has a history of allergies symptoms. She grinds her teeth and has been using a bite guard for years. Her weight has been fluctuating. At one point she was able to lose weight and she followed with weight management but no longer sees a weight management doctor. Her snoring is reportedly loud and disturbing to her husband. She denies any telltale restless leg symptoms. She is a restless sleeper. She has had some morning headaches. She has migrainous headaches infrequently. No formal migraine diagnosis from before however. She denies nocturia. She is not aware of any family history of OSA. She has recently seen ENT for her nasal congestion and concern for nasal polyps. She has a sinus CT pending soon. She is a nonsmoker, does not utilize alcohol and drinks caffeine occasionally in the form of soda, not daily. She works for [employer].   Observations/Objective:  Generalized: Well developed, in no acute distress  Mentation: Alert oriented to time, place, history taking. Follows all commands speech and language fluent   Assessment and Plan:  53 y.o. year old female  has a past medical history of Acute non-recurrent pansinusitis (01/31/2022), GERD (gastroesophageal reflux disease), H/O gestational diabetes mellitus, not currently pregnant, H/O iron deficiency anemia, Nasal congestion (01/31/2022), Seasonal allergies, Sinobronchitis (07/25/2023), and Sleep apnea. here with     ICD-10-CM   1. OSA on CPAP  G47.33 For home use only DME continuous positive airway pressure (CPAP)     Carol Parks is doing well with CPAP therapy.  Compliance report reveals acceptable compliance. I have encouraged her to use CPAP every night for greater than 4 hours every night. I will update supply orders. She will call me when ready to repeat HST. Healthy lifestyle habits encouraged. She will follow-up with us  in 1 year, sooner if needed.  She verbalizes understanding and agreement with this plan.  Orders Placed This Encounter  Procedures   For home use only DME continuous positive airway pressure (CPAP)    Heated Humidity with all supplies as needed    Length of Need:   Lifetime    Patient has OSA or probable OSA:   Yes    Is the patient currently  using CPAP in the home:   Yes    Settings:   Other see comments    CPAP supplies needed:   Mask, headgear, cushions, filters, heated tubing and water chamber     No orders of the defined types were placed in this encounter.     Follow Up Instructions:  I discussed the assessment and treatment plan with the patient. The patient was provided an opportunity to ask questions and all were answered. The patient agreed with the plan and demonstrated an understanding of the instructions.   The patient was advised to call back or seek an in-person evaluation if the symptoms worsen or if the condition fails to improve as anticipated.  I provided 15 minutes of non-face-to-face time during this encounter.  Patient is located at her place of residence during my chart visit.  Providers in the office.   Geselle Cardosa, NP

## 2024-06-24 NOTE — Patient Instructions (Incomplete)
 SABRA

## 2024-06-25 ENCOUNTER — Encounter: Payer: Self-pay | Admitting: Family Medicine

## 2024-06-25 ENCOUNTER — Telehealth (INDEPENDENT_AMBULATORY_CARE_PROVIDER_SITE_OTHER): Payer: 59 | Admitting: Family Medicine

## 2024-06-25 DIAGNOSIS — G4733 Obstructive sleep apnea (adult) (pediatric): Secondary | ICD-10-CM

## 2024-08-19 ENCOUNTER — Ambulatory Visit: Payer: Self-pay | Admitting: Nurse Practitioner

## 2024-08-19 ENCOUNTER — Encounter: Payer: Self-pay | Admitting: Nurse Practitioner

## 2024-08-19 VITALS — BP 130/86 | HR 82 | Temp 97.0°F | Resp 18 | Ht 64.0 in | Wt 265.4 lb

## 2024-08-19 DIAGNOSIS — E782 Mixed hyperlipidemia: Secondary | ICD-10-CM | POA: Diagnosis not present

## 2024-08-19 DIAGNOSIS — Z23 Encounter for immunization: Secondary | ICD-10-CM | POA: Diagnosis not present

## 2024-08-19 DIAGNOSIS — G4733 Obstructive sleep apnea (adult) (pediatric): Secondary | ICD-10-CM | POA: Diagnosis not present

## 2024-08-19 NOTE — Progress Notes (Signed)
 Careteam: Patient Care Team: [name], NP as PCP - General (Geriatric Medicine)  PLACE OF SERVICE:  [facility]  Advanced Directive information    Allergies[1]  Chief Complaint  Patient presents with   Medical Management of Chronic Issues    Three Months follow-up, needs flu, covid and pneumonia vaccine    HPI:  Discussed the use of AI scribe software for clinical note transcription with the patient, who gave verbal consent to proceed.  History of Present Illness Carol Parks is a 53 year old female who presents for a three-month follow-up and evaluation of weight loss on Zepbound .  She has been on Zepbound  since July 20th, 2025, resulting in a weight loss of approximately 21 pounds over the past five months. She notes a decrease in clothing size and receives positive comments from others about her weight loss. Her food intake is about half of her usual, with a focus on protein. Despite recent illness and fatigue limiting her exercise, she is satisfied with her weight loss progress.  She experiences gastrointestinal side effects from Zepbound , including alternating episodes of diarrhea and constipation, with bowel movements occurring every other day.  She had a respiratory illness before Thanksgiving, diagnosed as bronchitis, and was treated with a five-day course of prednisone  and a Z-Pak. Her symptoms have improved, but she continues to have a residual cough. Her husband and mother also contracted the illness. No fever was reported during the illness.  She takes omeprazole  daily for indigestion, which is well-controlled as long as she adheres to her medication regimen.  She has had her annual flu shot.  Review of Systems:  Review of Systems  Constitutional:  Negative for chills, fever and weight loss.  HENT:  Negative for tinnitus.   Respiratory:  Negative for cough, sputum production and shortness of breath.   Cardiovascular:  Negative for chest pain,  palpitations and leg swelling.  Gastrointestinal:  Negative for abdominal pain, constipation, diarrhea and heartburn.  Genitourinary:  Negative for dysuria, frequency and urgency.  Musculoskeletal:  Negative for back pain, falls, joint pain and myalgias.  Skin: Negative.   Neurological:  Negative for dizziness and headaches.  Psychiatric/Behavioral:  Negative for depression and memory loss. The patient does not have insomnia.     Past Medical History:  Diagnosis Date   Acute non-recurrent pansinusitis 01/31/2022   GERD (gastroesophageal reflux disease)    H/O gestational diabetes mellitus, not currently pregnant    H/O iron deficiency anemia    due to donating blood   Nasal congestion 01/31/2022   Seasonal allergies    Sinobronchitis 07/25/2023   Sleep apnea    uses CPAP   Past Surgical History:  Procedure Laterality Date   REDUCTION MAMMAPLASTY Bilateral 1991   TUBAL LIGATION Bilateral 09/05/2013   Procedure: POST PARTUM TUBAL LIGATION;  Surgeon: Nena DELENA App, MD;  Location: [facility];  Service: Gynecology;  Laterality: Bilateral;   WISDOM TOOTH EXTRACTION     Social History:   reports that she has never smoked. She has never been exposed to tobacco smoke. She has never used smokeless tobacco. She reports that she does not drink alcohol and does not use drugs.  Family History  Problem Relation Age of Onset   Cancer Mother 71       breast cancer   Hypertension Mother    Early death Father    Heart disease Father    Heart attack Father    Aneurysm Father    Stroke Father  Hyperlipidemia Brother    Asthma Daughter    Mental retardation Paternal Aunt    Heart attack Maternal Grandmother    Heart attack Paternal Grandmother    Heart attack Paternal Grandfather    Colon polyps Neg Hx    Colon cancer Neg Hx    Esophageal cancer Neg Hx    Rectal cancer Neg Hx    Stomach cancer Neg Hx     Medications: Patient's Medications  New Prescriptions   No medications on  file  Previous Medications   CETIRIZINE (ZYRTEC) 10 MG CHEWABLE TABLET    Chew 10 mg by mouth daily as needed.   CHOLECALCIFEROL (VITAMIN D3 PO)    Take 1 tablet by mouth daily.   FLUTICASONE  (FLONASE ) 50 MCG/ACT NASAL SPRAY    Place 2 sprays into both nostrils daily.   MULTIPLE VITAMINS-IRON (MULTIVITAMIN PLUS IRON ADULT PO)    Take 1 tablet by mouth 2 (two) times a week.   OMEGA-3 FATTY ACIDS (FISH OIL PO)    Take 1 capsule by mouth 2 (two) times a week.   OMEPRAZOLE  (PRILOSEC) 20 MG CAPSULE    Take 1 capsule by mouth daily.   RED YEAST RICE EXTRACT (RED YEAST RICE PO)    Take 1 capsule by mouth 2 (two) times a week.   TIRZEPATIDE  (ZEPBOUND ) 10 MG/0.5ML PEN    Inject 10 mg into the skin once a week.  Modified Medications   No medications on file  Discontinued Medications   No medications on file    Physical Exam:  Vitals:   08/19/24 1107  BP: (!) 136/92  Pulse: 82  Resp: 18  Temp: (!) 97 F (36.1 C)  SpO2: 98%  Weight: 265 lb 6.4 oz (120.4 kg)  Height: 5' 4 (1.626 m)   Body mass index is 45.56 kg/m. Wt Readings from Last 3 Encounters:  08/19/24 265 lb 6.4 oz (120.4 kg)  05/20/24 273 lb 6.4 oz (124 kg)  03/04/24 279 lb 9.6 oz (126.8 kg)    Physical Exam Constitutional:      General: She is not in acute distress.    Appearance: She is well-developed. She is not diaphoretic.  HENT:     Head: Normocephalic and atraumatic.     Mouth/Throat:     Pharynx: No oropharyngeal exudate.  Eyes:     Conjunctiva/sclera: Conjunctivae normal.     Pupils: Pupils are equal, round, and reactive to light.  Cardiovascular:     Rate and Rhythm: Normal rate and regular rhythm.     Heart sounds: Normal heart sounds.  Pulmonary:     Effort: Pulmonary effort is normal.     Breath sounds: Normal breath sounds.  Abdominal:     General: Bowel sounds are normal.     Palpations: Abdomen is soft.  Musculoskeletal:     Cervical back: Normal range of motion and neck supple.     Right lower  leg: No edema.     Left lower leg: No edema.  Skin:    General: Skin is warm and dry.  Neurological:     Mental Status: She is alert.  Psychiatric:        Mood and Affect: Mood normal.     Labs reviewed: Basic Metabolic Panel: Recent Labs    03/04/24 0856  NA 138  K 4.1  CL 105  CO2 23  GLUCOSE 95  BUN 15  CREATININE 0.72  CALCIUM 8.7   Liver Function Tests: Recent Labs  03/04/24 0856  AST 11  ALT 13  BILITOT 0.5  PROT 7.1   No results for input(s): LIPASE, AMYLASE in the last 8760 hours. No results for input(s): AMMONIA in the last 8760 hours. CBC: Recent Labs    03/04/24 0856  WBC 10.8  NEUTROABS 7,366  HGB 12.8  HCT 41.7  MCV 81.6  PLT 378   Lipid Panel: Recent Labs    03/04/24 0856  CHOL 204*  HDL 54  LDLCALC 129*  TRIG 104  CHOLHDL 3.8   TSH: No results for input(s): TSH in the last 8760 hours. A1C: Lab Results  Component Value Date   HGBA1C 5.5 01/18/2023     Assessment/Plan  Need for vaccination against Streptococcus pneumoniae -     Pneumococcal conjugate vaccine 20-valent   Assessment and Plan Assessment & Plan Morbid obesity Weight loss effective with Zepbound . Side effects include occasional constipation and diarrhea. Current dosage maintained due to ongoing weight loss. - Continue Zepbound  10 mg weekly. - Monitor weight loss and adjust dosage if weight loss stalls. - Ordered blood work to check electrolytes, kidney, liver, and lipid levels.  Mixed hyperlipidemia - Ordered lipid panel to assess current levels.  Obstructive sleep apnea CPAP use continues.  Gastroesophageal reflux disease Well-controlled with omeprazole . - Continue omeprazole  daily.   Keep appt for CPE in July, sooner if needed for follow up  Neville Walston K. Caro BODILY [facility] [phone]     [1]  Allergies Allergen Reactions   Penicillins Other (See Comments)    Get yeast infection   Latex Rash

## 2024-08-20 ENCOUNTER — Encounter: Payer: Self-pay | Admitting: Nurse Practitioner

## 2024-08-20 LAB — CBC WITH DIFFERENTIAL/PLATELET
Absolute Lymphocytes: 2409 {cells}/uL (ref 850–3900)
Absolute Monocytes: 809 {cells}/uL (ref 200–950)
Basophils Absolute: 74 {cells}/uL (ref 0–200)
Basophils Relative: 0.8 %
Eosinophils Absolute: 242 {cells}/uL (ref 15–500)
Eosinophils Relative: 2.6 %
HCT: 40.6 % (ref 35.9–46.0)
Hemoglobin: 12.9 g/dL (ref 11.7–15.5)
MCH: 27.8 pg (ref 27.0–33.0)
MCHC: 31.8 g/dL (ref 31.6–35.4)
MCV: 87.5 fL (ref 81.4–101.7)
MPV: 10 fL (ref 7.5–12.5)
Monocytes Relative: 8.7 %
Neutro Abs: 5766 {cells}/uL (ref 1500–7800)
Neutrophils Relative %: 62 %
Platelets: 409 Thousand/uL — ABNORMAL HIGH (ref 140–400)
RBC: 4.64 Million/uL (ref 3.80–5.10)
RDW: 14.6 % (ref 11.0–15.0)
Total Lymphocyte: 25.9 %
WBC: 9.3 Thousand/uL (ref 3.8–10.8)

## 2024-08-20 LAB — LIPID PANEL
Cholesterol: 198 mg/dL (ref <200)
HDL: 52 mg/dL (ref >=50)
LDL Cholesterol (Calc): 126 mg/dL — ABNORMAL HIGH
Non-HDL Cholesterol (Calc): 146 mg/dL — ABNORMAL HIGH (ref <130)
Total CHOL/HDL Ratio: 3.8 (calc) (ref <5.0)
Triglycerides: 96 mg/dL (ref <150)

## 2024-08-20 LAB — COMPREHENSIVE METABOLIC PANEL WITH GFR
AG Ratio: 1.4 (calc) (ref 1.0–2.5)
ALT: 10 U/L (ref 6–29)
AST: 12 U/L (ref 10–35)
Albumin: 3.8 g/dL (ref 3.6–5.1)
Alkaline phosphatase (APISO): 72 U/L (ref 37–153)
BUN: 15 mg/dL (ref 7–25)
CO2: 25 mmol/L (ref 20–32)
Calcium: 8.8 mg/dL (ref 8.6–10.4)
Chloride: 105 mmol/L (ref 98–110)
Creat: 0.75 mg/dL (ref 0.50–1.03)
Globulin: 2.7 g/dL (ref 1.9–3.7)
Glucose, Bld: 74 mg/dL (ref 65–139)
Potassium: 4.2 mmol/L (ref 3.5–5.3)
Sodium: 139 mmol/L (ref 135–146)
Total Bilirubin: 0.4 mg/dL (ref 0.2–1.2)
Total Protein: 6.5 g/dL (ref 6.1–8.1)
eGFR: 96 mL/min/1.73m2 (ref >=60)

## 2025-03-10 ENCOUNTER — Encounter: Payer: Self-pay | Admitting: Nurse Practitioner

## 2025-07-07 ENCOUNTER — Telehealth: Admitting: Family Medicine
